# Patient Record
Sex: Female | Born: 1975 | Race: White | Hispanic: No | Marital: Married | Smoking: Current every day smoker
Health system: Southern US, Community
[De-identification: ages and names within clinical notes are randomized; demographics above are authoritative.]

## PROBLEM LIST (undated history)

## (undated) DIAGNOSIS — M1990 Unspecified osteoarthritis, unspecified site: Secondary | ICD-10-CM

## (undated) DIAGNOSIS — G459 Transient cerebral ischemic attack, unspecified: Secondary | ICD-10-CM

## (undated) DIAGNOSIS — E119 Type 2 diabetes mellitus without complications: Secondary | ICD-10-CM

## (undated) DIAGNOSIS — F32A Depression, unspecified: Secondary | ICD-10-CM

## (undated) DIAGNOSIS — O039 Complete or unspecified spontaneous abortion without complication: Secondary | ICD-10-CM

## (undated) DIAGNOSIS — D649 Anemia, unspecified: Secondary | ICD-10-CM

## (undated) DIAGNOSIS — I639 Cerebral infarction, unspecified: Secondary | ICD-10-CM

## (undated) DIAGNOSIS — Q211 Atrial septal defect: Secondary | ICD-10-CM

## (undated) DIAGNOSIS — T7840XA Allergy, unspecified, initial encounter: Secondary | ICD-10-CM

## (undated) DIAGNOSIS — K219 Gastro-esophageal reflux disease without esophagitis: Secondary | ICD-10-CM

## (undated) DIAGNOSIS — G43909 Migraine, unspecified, not intractable, without status migrainosus: Secondary | ICD-10-CM

## (undated) DIAGNOSIS — G4730 Sleep apnea, unspecified: Secondary | ICD-10-CM

## (undated) DIAGNOSIS — F329 Major depressive disorder, single episode, unspecified: Secondary | ICD-10-CM

## (undated) DIAGNOSIS — D869 Sarcoidosis, unspecified: Secondary | ICD-10-CM

## (undated) DIAGNOSIS — F419 Anxiety disorder, unspecified: Secondary | ICD-10-CM

## (undated) DIAGNOSIS — Q2112 Patent foramen ovale: Secondary | ICD-10-CM

## (undated) HISTORY — PX: BRONCHOSCOPY: SUR163

## (undated) HISTORY — DX: Transient cerebral ischemic attack, unspecified: G45.9

## (undated) HISTORY — DX: Migraine, unspecified, not intractable, without status migrainosus: G43.909

## (undated) HISTORY — DX: Major depressive disorder, single episode, unspecified: F32.9

## (undated) HISTORY — DX: Anemia, unspecified: D64.9

## (undated) HISTORY — DX: Depression, unspecified: F32.A

## (undated) HISTORY — PX: ABDOMINAL HYSTERECTOMY: SHX81

## (undated) HISTORY — DX: Type 2 diabetes mellitus without complications: E11.9

## (undated) HISTORY — DX: Patent foramen ovale: Q21.12

## (undated) HISTORY — DX: Complete or unspecified spontaneous abortion without complication: O03.9

## (undated) HISTORY — DX: Sleep apnea, unspecified: G47.30

## (undated) HISTORY — PX: NASAL SINUS SURGERY: SHX719

## (undated) HISTORY — DX: Unspecified osteoarthritis, unspecified site: M19.90

## (undated) HISTORY — PX: HAND SURGERY: SHX662

## (undated) HISTORY — PX: CHOLECYSTECTOMY: SHX55

## (undated) HISTORY — DX: Anxiety disorder, unspecified: F41.9

## (undated) HISTORY — DX: Allergy, unspecified, initial encounter: T78.40XA

## (undated) HISTORY — DX: Sarcoidosis, unspecified: D86.9

## (undated) HISTORY — DX: Gastro-esophageal reflux disease without esophagitis: K21.9

## (undated) HISTORY — DX: Atrial septal defect: Q21.1

## (undated) HISTORY — DX: Cerebral infarction, unspecified: I63.9

---

## 1997-12-18 ENCOUNTER — Emergency Department (HOSPITAL_COMMUNITY): Admission: EM | Admit: 1997-12-18 | Discharge: 1997-12-18 | Payer: Self-pay | Admitting: Emergency Medicine

## 1998-07-22 ENCOUNTER — Other Ambulatory Visit: Admission: RE | Admit: 1998-07-22 | Discharge: 1998-07-22 | Payer: Self-pay | Admitting: Gynecology

## 1999-08-08 ENCOUNTER — Other Ambulatory Visit: Admission: RE | Admit: 1999-08-08 | Discharge: 1999-08-08 | Payer: Self-pay | Admitting: Gynecology

## 1999-10-28 ENCOUNTER — Encounter: Admission: RE | Admit: 1999-10-28 | Discharge: 2000-01-26 | Payer: Self-pay | Admitting: *Deleted

## 2000-08-14 ENCOUNTER — Other Ambulatory Visit: Admission: RE | Admit: 2000-08-14 | Discharge: 2000-08-14 | Payer: Self-pay | Admitting: Gynecology

## 2001-08-19 ENCOUNTER — Other Ambulatory Visit: Admission: RE | Admit: 2001-08-19 | Discharge: 2001-08-19 | Payer: Self-pay | Admitting: Gynecology

## 2002-10-02 ENCOUNTER — Other Ambulatory Visit: Admission: RE | Admit: 2002-10-02 | Discharge: 2002-10-02 | Payer: Self-pay | Admitting: Gynecology

## 2002-10-07 ENCOUNTER — Encounter: Admission: RE | Admit: 2002-10-07 | Discharge: 2002-10-07 | Payer: Self-pay | Admitting: Otolaryngology

## 2002-10-07 ENCOUNTER — Encounter: Payer: Self-pay | Admitting: Otolaryngology

## 2002-11-14 ENCOUNTER — Encounter: Payer: Self-pay | Admitting: Surgery

## 2002-11-14 ENCOUNTER — Inpatient Hospital Stay (HOSPITAL_COMMUNITY): Admission: EM | Admit: 2002-11-14 | Discharge: 2002-11-15 | Payer: Self-pay | Admitting: Emergency Medicine

## 2002-11-14 ENCOUNTER — Encounter (INDEPENDENT_AMBULATORY_CARE_PROVIDER_SITE_OTHER): Payer: Self-pay | Admitting: Specialist

## 2002-11-14 ENCOUNTER — Encounter: Payer: Self-pay | Admitting: Emergency Medicine

## 2005-03-28 ENCOUNTER — Encounter: Admission: RE | Admit: 2005-03-28 | Discharge: 2005-03-28 | Payer: Self-pay

## 2005-03-31 ENCOUNTER — Ambulatory Visit: Payer: Self-pay | Admitting: Internal Medicine

## 2005-05-12 ENCOUNTER — Ambulatory Visit: Payer: Self-pay | Admitting: Internal Medicine

## 2005-05-29 ENCOUNTER — Ambulatory Visit: Payer: Self-pay | Admitting: Internal Medicine

## 2005-07-11 ENCOUNTER — Ambulatory Visit: Payer: Self-pay | Admitting: Internal Medicine

## 2005-08-30 ENCOUNTER — Ambulatory Visit: Payer: Self-pay | Admitting: Pulmonary Disease

## 2005-09-08 ENCOUNTER — Encounter (INDEPENDENT_AMBULATORY_CARE_PROVIDER_SITE_OTHER): Payer: Self-pay | Admitting: Specialist

## 2005-09-08 ENCOUNTER — Ambulatory Visit: Admission: RE | Admit: 2005-09-08 | Discharge: 2005-09-08 | Payer: Self-pay | Admitting: Internal Medicine

## 2005-09-08 ENCOUNTER — Ambulatory Visit: Payer: Self-pay | Admitting: Internal Medicine

## 2005-09-26 ENCOUNTER — Ambulatory Visit: Payer: Self-pay | Admitting: Internal Medicine

## 2005-11-07 ENCOUNTER — Ambulatory Visit: Payer: Self-pay | Admitting: Internal Medicine

## 2006-01-03 ENCOUNTER — Ambulatory Visit: Payer: Self-pay | Admitting: Internal Medicine

## 2006-01-12 ENCOUNTER — Ambulatory Visit: Payer: Self-pay | Admitting: Internal Medicine

## 2007-04-05 DIAGNOSIS — R059 Cough, unspecified: Secondary | ICD-10-CM | POA: Insufficient documentation

## 2007-04-05 DIAGNOSIS — D869 Sarcoidosis, unspecified: Secondary | ICD-10-CM

## 2007-04-05 DIAGNOSIS — J40 Bronchitis, not specified as acute or chronic: Secondary | ICD-10-CM | POA: Insufficient documentation

## 2007-04-05 DIAGNOSIS — L52 Erythema nodosum: Secondary | ICD-10-CM | POA: Insufficient documentation

## 2007-04-05 DIAGNOSIS — R05 Cough: Secondary | ICD-10-CM

## 2007-04-05 DIAGNOSIS — M129 Arthropathy, unspecified: Secondary | ICD-10-CM | POA: Insufficient documentation

## 2007-04-05 DIAGNOSIS — R0602 Shortness of breath: Secondary | ICD-10-CM | POA: Insufficient documentation

## 2007-04-05 DIAGNOSIS — D860 Sarcoidosis of lung: Secondary | ICD-10-CM | POA: Insufficient documentation

## 2007-04-05 DIAGNOSIS — J00 Acute nasopharyngitis [common cold]: Secondary | ICD-10-CM | POA: Insufficient documentation

## 2009-09-10 ENCOUNTER — Ambulatory Visit: Payer: Self-pay | Admitting: Diagnostic Radiology

## 2009-09-10 ENCOUNTER — Encounter: Payer: Self-pay | Admitting: Emergency Medicine

## 2009-09-11 ENCOUNTER — Observation Stay (HOSPITAL_COMMUNITY): Admission: EM | Admit: 2009-09-11 | Discharge: 2009-09-11 | Payer: Self-pay | Admitting: Emergency Medicine

## 2009-09-11 ENCOUNTER — Encounter (INDEPENDENT_AMBULATORY_CARE_PROVIDER_SITE_OTHER): Payer: Self-pay | Admitting: Pediatrics

## 2009-09-11 ENCOUNTER — Ambulatory Visit: Payer: Self-pay | Admitting: Vascular Surgery

## 2009-09-30 ENCOUNTER — Ambulatory Visit (HOSPITAL_COMMUNITY): Admission: RE | Admit: 2009-09-30 | Discharge: 2009-09-30 | Payer: Self-pay | Admitting: Cardiology

## 2009-09-30 ENCOUNTER — Encounter (INDEPENDENT_AMBULATORY_CARE_PROVIDER_SITE_OTHER): Payer: Self-pay | Admitting: Cardiology

## 2010-07-04 LAB — PROTEIN S, TOTAL: Protein S Ag, Total: 80 % (ref 70–140)

## 2010-07-04 LAB — CBC
MCV: 87.8 fL (ref 78.0–100.0)
Platelets: 234 10*3/uL (ref 150–400)
Platelets: 290 10*3/uL (ref 150–400)
RBC: 4.71 MIL/uL (ref 3.87–5.11)
RDW: 12.8 % (ref 11.5–15.5)
WBC: 6.2 10*3/uL (ref 4.0–10.5)
WBC: 7.5 10*3/uL (ref 4.0–10.5)

## 2010-07-04 LAB — URINALYSIS, ROUTINE W REFLEX MICROSCOPIC
Glucose, UA: NEGATIVE mg/dL
Ketones, ur: NEGATIVE mg/dL
Nitrite: NEGATIVE
Nitrite: NEGATIVE
Protein, ur: NEGATIVE mg/dL
Protein, ur: NEGATIVE mg/dL
Urobilinogen, UA: 0.2 mg/dL (ref 0.0–1.0)
Urobilinogen, UA: 0.2 mg/dL (ref 0.0–1.0)
pH: 6 (ref 5.0–8.0)

## 2010-07-04 LAB — PREGNANCY, URINE: Preg Test, Ur: NEGATIVE

## 2010-07-04 LAB — COMPREHENSIVE METABOLIC PANEL
ALT: 41 U/L — ABNORMAL HIGH (ref 0–35)
AST: 23 U/L (ref 0–37)
AST: 26 U/L (ref 0–37)
Albumin: 4.4 g/dL (ref 3.5–5.2)
BUN: 8 mg/dL (ref 6–23)
CO2: 28 mEq/L (ref 19–32)
CO2: 29 mEq/L (ref 19–32)
Calcium: 9.4 mg/dL (ref 8.4–10.5)
Chloride: 105 mEq/L (ref 96–112)
Chloride: 106 mEq/L (ref 96–112)
Creatinine, Ser: 0.57 mg/dL (ref 0.4–1.2)
Creatinine, Ser: 0.6 mg/dL (ref 0.4–1.2)
GFR calc Af Amer: >60 mL/min (ref >60)
GFR calc Af Amer: >60 mL/min (ref >60)
GFR calc non Af Amer: >60 mL/min (ref >60)
GFR calc non Af Amer: >60 mL/min (ref >60)
Sodium: 146 mEq/L — ABNORMAL HIGH (ref 135–145)
Total Bilirubin: 0.2 mg/dL — ABNORMAL LOW (ref 0.3–1.2)
Total Bilirubin: 0.4 mg/dL (ref 0.3–1.2)

## 2010-07-04 LAB — DIFFERENTIAL
Basophils Absolute: 0 10*3/uL (ref 0.0–0.1)
Basophils Relative: 1 % (ref 0–1)
Eosinophils Relative: 0 % (ref 0–5)
Lymphocytes Relative: 34 % (ref 12–46)
Lymphs Abs: 2.1 10*3/uL (ref 0.7–4.0)
Monocytes Relative: 5 % (ref 3–12)
Neutro Abs: 3.7 10*3/uL (ref 1.7–7.7)

## 2010-07-04 LAB — BETA-2-GLYCOPROTEIN I ABS, IGG/M/A
Beta-2-Glycoprotein I IgA: 6 A Units (ref <20)
Beta-2-Glycoprotein I IgM: 0 M Units (ref <20)

## 2010-07-04 LAB — CK TOTAL AND CKMB (NOT AT ARMC)
CK, MB: 1.7 ng/mL (ref 0.3–4.0)
Relative Index: INVALID (ref 0.0–2.5)

## 2010-07-04 LAB — PROTEIN S ACTIVITY: Protein S Activity: 86 % (ref 69–129)

## 2010-07-04 LAB — HOMOCYSTEINE: Homocysteine: 4.3 umol/L (ref 4.0–15.4)

## 2010-07-04 LAB — LIPID PANEL
Cholesterol: 189 mg/dL (ref 0–200)
HDL: 44 mg/dL (ref >39)
Total CHOL/HDL Ratio: 4.3 RATIO
Triglycerides: 46 mg/dL (ref <150)

## 2010-07-04 LAB — PROTIME-INR: INR: 1 (ref 0.00–1.49)

## 2010-07-04 LAB — GLUCOSE, CAPILLARY: Glucose-Capillary: 111 mg/dL — ABNORMAL HIGH (ref 70–99)

## 2010-07-04 LAB — PROTEIN C, TOTAL: Protein C, Total: 126 % (ref 70–140)

## 2010-07-04 LAB — LUPUS ANTICOAGULANT PANEL: PTT Lupus Anticoagulant: 37.2 secs (ref 32.0–43.4)

## 2010-07-04 LAB — APTT: aPTT: 29 seconds (ref 24–37)

## 2010-07-04 LAB — PROTHROMBIN GENE MUTATION

## 2010-07-04 LAB — FACTOR 5 LEIDEN

## 2010-09-02 NOTE — H&P (Signed)
NAME:  Shelly [name], Shelly [name]                        ACCOUNT NO.:  [id number]   MEDICAL RECORD NO.:  [id number]                   PATIENT TYPE:  INP   LOCATION:  0478                                 FACILITY:  [facility]   PHYSICIAN:  Abigail Miyamoto, M.D.              DATE OF BIRTH:  August 06, 1975   DATE OF ADMISSION:  11/14/2002  DATE OF DISCHARGE:                                HISTORY & PHYSICAL   CHIEF COMPLAINT:  Abdominal pain, nausea, and vomiting.   HISTORY:  Ms. Shelly [name] is a 35 year old female with a five week history of  intermittent right upper quadrant abdominal pain and nausea.  She reports  over the past few days the pain has become much worse and she has also  developed emesis today.  She denies any jaundice with this.  She reports the  pain is potentially related to fatty meals.  She has been moving her bowels  well and passing urine normally.  She denies any fevers or chills, chest  pain, or shortness of breath.  She reports the pain is 8/10 and sharp.  Currently, she has had sudden resolution of her pain.   PAST MEDICAL HISTORY:  Sinusitis.   PAST SURGICAL HISTORY:  Sinus surgery.  She has also had orthopedic surgery  on her left hand after trauma.   MEDICATIONS:  1. Birth control pills.  2. Zyrtec.   SOCIAL HISTORY:  She does smoke and use alcohol on occasion.  She does not  use drugs.   REVIEW OF SYSTEMS:  Otherwise unremarkable from a cardiopulmonary  standpoint.   PHYSICAL EXAMINATION:  VITAL SIGNS:  Temperature 99.3, blood pressure  107/66, heart rate 64, respiratory rate 20.  GENERAL:  She is well appearing.  HEENT:  Eyes:  There is minimal scleral icterus.  Pupils are reactive.  Ear,  nose, mouth, and throat:  Her external ears and nose are normal.  Oropharynx  is clear.  Hearing is normal.  NECK:  There is no adenopathy.  There is no thyromegaly.  LUNGS:  Clear to auscultation bilaterally with normal effort.  CARDIOVASCULAR:  Regular rate and rhythm with a  systolic ejection murmur.  There is no peripheral edema.  ABDOMEN:  Soft.  There is minimal tenderness in the epigastrium.  There is  minimal guarding.  There is no organomegaly.  There are no hernias.  EXTREMITIES:  Warm and well perfused.   LABORATORY DATA:  The patient has a bilirubin elevated at 30.1 and alkaline  phosphatase normal at 89, AST elevated at 181, and ALT elevated 232.  Electrolytes are sodium of 139, potassium 3.6, BUN and creatinine 6 and 0.7.  CBC shows the patient to have a white blood count of 7.3 with a hemoglobin  12.4.  Report from abdominal ultrasound shows the patient to have a positive  Murphy's sign, gallstones with sludge, and findings consistent with acute  cholecystitis.   ASSESSMENT/PLAN:  This is  a 35 year old female with acute cholecystitis and  possible choledocholithiasis.  At this point we will repeat her bilirubin.  If the bilirubin remains elevated we may need to get a gastroenterology  consult and an ERCP prior to going to the operating room.  If not, we will  proceed to the operating room for a laparoscopic cholecystectomy and  intraoperative cholangiogram.  I have discussed the surgery with her in  detail including the risks of bleeding, infection, common bile duct injury,  need for open surgery, etc. and at this point she wishes to proceed with  current plan.  We will start her on intravenous antibiotics and proceed with  intravenous rehydration and bowel rest.                                               Abigail Miyamoto, M.D.    DB/MEDQ  D:  11/14/2002  T:  11/14/2002  Job:  [id number]

## 2010-09-02 NOTE — Assessment & Plan Note (Signed)
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  She had been tapered down to  prednisone 5 alternating with 10 mg per day before she developed acute  worsening of cough preceded by a nasal symptoms of rhinitis with postnasal  drip for which I had initially recommended doxycycline but now was started  on Avalox five days ago and continues to have a hacking cough with  transitionally yellow sputum and then back toward white again now.  She  denies any pleuritic pain or fever, sore throat, or ongoing nasal  complaints.  Interestingly, she denies any acute arthritic symptoms or  recurrent erythema nodosum or rash suggestive of erythema nodosum.   PHYSICAL EXAMINATION:  She is a pleasant, pretty white female who does not  appear acutely ill.  She has stable vital signs except for a pulse rate of 130.  HEENT:  Unremarkable.  Pharynx was clear.  NECK:  Supple without cervical adenopathy or tenderness.  Trachea is  midline.  No megaly.  Lung fields reveal a few pops and squeaks bilaterally but with minimal  expiratory rhonchi.  There was a regular rhythm with a regular apical rhythm  at about 120.  ABDOMEN:  Soft and benign.  EXTREMITIES:  Warm without calf tenderness, cyanosis, clubbing, edema, or  erythema nodosum.   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  Although  I do not really think she is having a sarcoid flare, I did recommend  increasing prednisone to 20 mg per day until better than rapid taper back  down  to 5 mg per day, her new floor.  I also will obtain a sedimentation  rate and ACE level today to see if there is any evidence of increased  sarcoid activity (previously she had dropped down to 56 on treatment on last  study date September 26, 2005, while on prednisone 20 mg daily).            ______________________________  Charlaine Dalton Sherene Sires, MD, [facility]    MBW/MedQ  DD:  01/12/2006  DT:  01/15/2006  Job #:  [id number]   cc:   Harrel Lemon. Merla Riches, M.D.

## 2010-09-02 NOTE — Assessment & Plan Note (Signed)
[facility]                               PULMONARY OFFICE NOTE   NAME:[name], Shelly RYBOLT                     MRN:          [id number]  DATE:01/03/2006                            DOB:          1975-07-20    HISTORY:  A 35 year old white female with classic sarcoid associated with  arthritis and erythema nodosum, onset in October 2006 and on prednisone  daily since November 2006.  Shelly [name] has now been able to taper prednisone down  to 5 mg alternating with 10 mg per day with no flare-up of either cough,  dyspnea or arthritis symptoms.   PHYSICAL EXAMINATION:  GENERAL:  Shelly [name] is a pleasant, ambulatory white female  in no acute distress.  VITAL SIGNS:  Stable vital signs.  HEENT:  Unremarkable.  NECK:  Clear.  LUNGS:  Lung fields are clear bilaterally to auscultation and percussion.  ABDOMEN:  Soft, benign.  EXTREMITIES:  Warm without calf tenderness, cyanosis, clubbing or edema.   Heme saturation 98% on room air.   Chest x-ray shows almost complete resolution of adenopathy and no  infiltrates.   IMPRESSION:  Dramatic reversal of all of her symptoms of sarcoid while on  prednisone and no flare-up as Shelly [name] is tapered down to 5 mg alternating with  10 mg per day.  Her chest x-ray also has shown gradual improvement in both  adenopathy and interstitial changes.   I therefore recommend that the prednisone be tapered to 5 mg every day for 4  weeks and then every other day for 2 weeks and then return here looking for  any evidence of increased skin or joint involvement or respiratory  complaints or nausea, for that matter (which might indicate adrenal  insufficiency).   When Shelly [name] rechecks at 6 weeks we will recheck an ACE level (it had decreased  from 129 before treatment all the way down to 56 while on treatment).                                   Charlaine Dalton. Sherene Sires, MD, [facility]   MBW/MedQ  DD:  01/03/2006  DT:  01/05/2006  Job #:  [id number]   cc:   Harrel Lemon.  Merla Riches, M.D.

## 2010-09-02 NOTE — Assessment & Plan Note (Signed)
[facility]                               PULMONARY OFFICE NOTE   NAME:Shelly [name], Shelly [name]                     MRN:          [id number]  DATE:11/07/2005                            DOB:          [birth year]    HISTORY:  35 year old white female with classic sarcoid, initially causing  polyarthritis and erythema nodosum in October 2006, and maintained on  prednisone since November 2006 with increasing symptoms of coughing and  dyspnea, for which she underwent transbronchial biopsies on May 25, showing  noncaseating granulomatous inflammation.  On 20 mg per day of prednisone,  all of her symptoms completely resolved, and she returns now, having tapered  prednisone down to 10 mg per day with no flare-up of any symptoms, and is  only short of breath with heavy exertion.  She is actively smoking, however.   She denies any active arthritis complaints, fevers, chills, sweats,  unintended weight loss, cough or unexplained dyspnea.  She does have new  acneiform skin rash over the extensor surfaces of her arms, for which she  has been prescribed doxycycline with no biopsy done to date.   PHYSICAL EXAMINATION:  She is a moderately obese, very pleasant, ambulatory  white female in no acute distress.  She has normal vital signs.  HEENT:  Unremarkable.  Anicteric sclerae.  LUNGS:  Lung fields perfectly clear bilaterally to auscultation and  percussion.  There was no cervical adenopathy or tenderness.  Trachea is  midline.  No thyromegaly.  Regular rhythm without murmur, gallop or rub.  No increase in P2.  ABDOMEN:  Obese but otherwise benign, with no palpable organomegaly, mass or  tenderness.  EXTREMITIES:  Warm without calf tenderness, cyanosis, clubbing or edema.   Chart review indicates ACE levels were 129 off of prednisone, and down to 56  on her last visit on prednisone at 20 mg daily.   IMPRESSION:  Complete remission of sarcoid, with the only issue  being  whether or not the skin changes we are seeing might be either steroid acne  or actually nodular skin involvement with sarcoid.  Since the skin issue is  cosmetic, I recommended tapering prednisone to 10 mg, and alternating with  5 mg per day for the next 6 weeks, and seeing if the skin problem flares.  If so, I would strongly consider a skin biopsy.   I did give her symptoms to be on the lookout for either sarcoid flare or  adrenal insufficiency, and when we see her back in 6 weeks, I recommended a  repeat chest x-ray and ACE level.  In the meantime, should she have either  adrenal insufficiency or obvious sarcoid symptoms that flare as the  prednisone is tapered, I am going to recommend she increase the dose back to  the  previous dose, which successfully controlled her symptoms.  On her next  visit, I am going to recommend working toward a 5 mg every other day taper.  Charlaine Dalton. Sherene Sires, MD, [facility]   MBW/MedQ  DD:  11/07/2005  DT:  11/08/2005  Job #:  [id number]   cc:   Areatha Keas, MD

## 2010-09-02 NOTE — Op Note (Signed)
Shelly [name], Shelly [name]              ACCOUNT NO.:  [id number]   MEDICAL RECORD NO.:  [id number]          PATIENT TYPE:  AMB   LOCATION:  CARD                         FACILITY:  [facility]   PHYSICIAN:  Casimiro Needle B. Sherene Sires, M.D. Romualdo Bolk OF BIRTH:  11-Jul-1975   DATE OF PROCEDURE:  09/08/2005  DATE OF DISCHARGE:                                 OPERATIVE REPORT   REFERRING PHYSICIAN:  Areatha Keas, M.D.   INDICATIONS FOR PROCEDURE:  Please see dictated original consultation note  plus Coralyn Helling, M.D.'s, note regarding history, indications.  The patient  agreed to procedure after discussion of the risks, benefits, and  alternatives.   There has been no change in her history or exam since Dr. Evlyn Courier outpatient  dictated evaluation attached to these records.   PROCEDURE:  Fiberoptic bronchoscopy with transbronchial biopsy of the right  lower lobe.   The procedure was performed in the bronchoscopy suite with continuous  monitoring by surface ECG and oximetry.  The patient maintained adequate  saturations throughout the procedure on nasal prongs.  She received a total  of 5 mg of IV Versed and 100 mg of IV Demerol for adequate sedation and  cough suppression.  She was premedicated also with 1% lidocaine by updraft  nebulizer and 2% lidocaine to the right nares.  Using the standard video  fiberoptic bronchoscope, the right nares was easily cannulated with good  visualization of entire oropharynx and larynx.  The cords moved normally and  there were no apparent upper airway lesions.  The entire tracheobronchial tree was then explored bilaterally to the  subsegmental level with all the airways widely open and no evidence of  mucosal abnormalities.  The only variant was that the usual trifurcation of  the right upper lobe was qadrification (four major branches).   Using a standard wedge position and fluoroscopic guidance, the right lower  lobe anterior basal segment was biopsied x3 with adequate tissue  obtained.  After the third biopsy there was moderate bleeding which was controlled by  placing the patient in the right lateral decubitus position and the airways  were cleared of any blood with no active bleeding at the time.  The  procedure was terminated.  Follow-up chest x-ray was done.   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   RECOMMENDATIONS:  Await transbronchial biopsy.           ______________________________  Charlaine Dalton Sherene Sires, M.D. [facility]    MBW/MEDQ  D:  09/08/2005  T:  09/08/2005  Job:  [id number]   cc:   Areatha Keas, M.D.  Fax: [phone]

## 2010-09-02 NOTE — Op Note (Signed)
NAME:  Shelly [name], Shelly [name]                        ACCOUNT NO.:  [id number]   MEDICAL RECORD NO.:  [id number]                   PATIENT TYPE:  INP   LOCATION:  0478                                 FACILITY:  [facility]   PHYSICIAN:  Abigail Miyamoto, M.D.              DATE OF BIRTH:  [birth year]   DATE OF PROCEDURE:  11/14/2002  DATE OF DISCHARGE:                                 OPERATIVE REPORT   PREOPERATIVE DIAGNOSIS:  Acute cholecystitis.   POSTOPERATIVE DIAGNOSIS:  Acute cholecystitis.   PROCEDURE:  Laparoscopic cholecystectomy with intraoperative cholangiogram.   SURGEON:  Douglas A. Magnus Ivan, M.D.   ASSISTANT:  Ollen Gross. Carolynne Edouard, M.D.   ANESTHESIA:  General endotracheal.   ESTIMATED BLOOD LOSS:  Minimal.   INDICATIONS:  Shelly [name] is a 35 year old female, who presented with  abdominal pain, nausea, and vomiting.  She was found on ultrasound to have  findings consistent with acute cholecystitis.  She was also found to have an  elevated bilirubin of 3.1 but a normal alkaline phosphatase.  Secondary to  her resolving symptoms, the decision was made to proceed to the operating  room for a cholecystectomy and cholangiogram.   FINDINGS:  The patient was found to have a normal cholangiogram without  evidence of common bile duct stone.   PROCEDURE IN DETAIL:  The patient was brought to the operating room and  identified as Shelly [name].  She was placed supine on the operating table,  and general anesthesia was induced.  Her abdomen was then prepped and draped  in the usual sterile fashion.  Using a #15 blade, a small transverse  incision was made below the umbilicus.  The incision was carried down to the  fascia which was then opened with a scalpel.  A hemostat was then used to  pass through the peritoneal cavity.  Next, a 0 Vicryl pursestring suture was  placed around the fascial opening.  The Hasson port was placed through the  opening, and insufflation of the abdomen was begun.   A 12 mm port was then  placed in the patient's epigastrium, and two 5 mm ports were placed in the  patient's right flank under direct vision.  The gallbladder was then  identified and retracted above the liver bed.  The gallbladder was found to  be acutely inflamed.  The cystic duct and cystic artery were dissected out.  The artery was actually slightly anterior, so it was clipped twice  proximally, once distally, and transected with scissors.  The cystic duct  was then identified and clipped once distally and partly transected with  scissors.  An angiocatheter was then inserted under direct vision into the  right upper quadrant.  The cholangiocatheter was then passed through the  Angiocath and then placed into the opening in the cystic duct.  A  cholangiogram was then performed under direct fluoroscopy with contrast.  Contrast was seen to flow into the  entire biliary tree and duodenum without  evidence of obstructing stone or lesion.  At this point, the  cholangiocatheter was removed.  The cystic duct was then clipped three times  proximally and transected with the scissors.  The gallbladder was then  slowly dissected free from the liver bed with the electrocautery.  The  gallbladder wall was found to be edematous and acutely inflamed.  Once the  gallbladder was freed from the liver bed, hemostasis was achieved with the  cautery.  The gallbladder was then removed through the incision at the  umbilicus.  The 0 Vicryl at the umbilicus was tied in place, closing the  fascial defect.  The liver bed was again examined and hemostasis felt to be  achieved.  The abdomen was then copiously irrigated with normal saline.  All  ports were then removed under direct vision, and the abdomen was deflated.  All incisions were anesthetized with 0.25% Marcaine and then closed with 4-0  Monocryl subcuticular sutures.  Steri-Strips, gauze, and tape were then  applied.  The patient tolerated the procedure well.   All sponge, needle, and  instrument counts were correct at the end of the procedure.  The patient was  then extubated in the operating room and taken in a stable condition to the  recovery room.                                               Abigail Miyamoto, M.D.    DB/MEDQ  D:  11/14/2002  T:  11/14/2002  Job:  [id number]

## 2011-02-01 ENCOUNTER — Emergency Department (HOSPITAL_COMMUNITY)
Admission: EM | Admit: 2011-02-01 | Discharge: 2011-02-01 | Disposition: A | Discharge status: Home / Self Care | Payer: BC Managed Care – PPO | Attending: Emergency Medicine | Admitting: Emergency Medicine

## 2011-02-01 ENCOUNTER — Emergency Department (HOSPITAL_COMMUNITY): Payer: BC Managed Care – PPO

## 2011-02-01 DIAGNOSIS — Z8673 Personal history of transient ischemic attack (TIA), and cerebral infarction without residual deficits: Secondary | ICD-10-CM | POA: Insufficient documentation

## 2011-02-01 DIAGNOSIS — K219 Gastro-esophageal reflux disease without esophagitis: Secondary | ICD-10-CM | POA: Insufficient documentation

## 2011-02-01 DIAGNOSIS — R209 Unspecified disturbances of skin sensation: Secondary | ICD-10-CM | POA: Insufficient documentation

## 2011-02-01 DIAGNOSIS — R059 Cough, unspecified: Secondary | ICD-10-CM | POA: Insufficient documentation

## 2011-02-01 DIAGNOSIS — J40 Bronchitis, not specified as acute or chronic: Secondary | ICD-10-CM | POA: Insufficient documentation

## 2011-02-01 DIAGNOSIS — D869 Sarcoidosis, unspecified: Secondary | ICD-10-CM | POA: Insufficient documentation

## 2011-02-01 DIAGNOSIS — R05 Cough: Secondary | ICD-10-CM | POA: Insufficient documentation

## 2011-02-01 LAB — DIFFERENTIAL
Basophils Absolute: 0 10*3/uL (ref 0.0–0.1)
Basophils Relative: 0 % (ref 0–1)
Eosinophils Absolute: 0 10*3/uL (ref 0.0–0.7)
Monocytes Absolute: 0.4 10*3/uL (ref 0.1–1.0)
Monocytes Relative: 6 % (ref 3–12)
Neutro Abs: 4.5 10*3/uL (ref 1.7–7.7)

## 2011-02-01 LAB — POCT I-STAT, CHEM 8
Creatinine, Ser: 0.6 mg/dL (ref 0.50–1.10)
HCT: 42 % (ref 36.0–46.0)
Hemoglobin: 14.3 g/dL (ref 12.0–15.0)
Potassium: 3.9 mEq/L (ref 3.5–5.1)
Sodium: 139 mEq/L (ref 135–145)
TCO2: 25 mmol/L (ref 0–100)

## 2011-02-01 LAB — CBC
Hemoglobin: 13.5 g/dL (ref 12.0–15.0)
MCH: 29.1 pg (ref 26.0–34.0)
MCHC: 34 g/dL (ref 30.0–36.0)
Platelets: 266 10*3/uL (ref 150–400)
RDW: 12.6 % (ref 11.5–15.5)

## 2011-04-24 ENCOUNTER — Ambulatory Visit (INDEPENDENT_AMBULATORY_CARE_PROVIDER_SITE_OTHER): Payer: BC Managed Care – PPO

## 2011-04-24 DIAGNOSIS — M545 Low back pain, unspecified: Secondary | ICD-10-CM

## 2011-04-24 DIAGNOSIS — S335XXA Sprain of ligaments of lumbar spine, initial encounter: Secondary | ICD-10-CM

## 2011-05-02 ENCOUNTER — Ambulatory Visit (INDEPENDENT_AMBULATORY_CARE_PROVIDER_SITE_OTHER): Payer: BC Managed Care – PPO

## 2011-05-02 DIAGNOSIS — S339XXA Sprain of unspecified parts of lumbar spine and pelvis, initial encounter: Secondary | ICD-10-CM

## 2011-05-02 DIAGNOSIS — M549 Dorsalgia, unspecified: Secondary | ICD-10-CM

## 2011-08-09 ENCOUNTER — Ambulatory Visit: Payer: BC Managed Care – PPO

## 2011-08-09 ENCOUNTER — Ambulatory Visit (INDEPENDENT_AMBULATORY_CARE_PROVIDER_SITE_OTHER): Payer: BC Managed Care – PPO | Admitting: Family Medicine

## 2011-08-09 VITALS — BP 108/74 | HR 88 | Temp 98.7°F | Resp 16 | Ht 64.0 in | Wt 189.0 lb

## 2011-08-09 DIAGNOSIS — R059 Cough, unspecified: Secondary | ICD-10-CM

## 2011-08-09 DIAGNOSIS — D860 Sarcoidosis of lung: Secondary | ICD-10-CM

## 2011-08-09 DIAGNOSIS — J329 Chronic sinusitis, unspecified: Secondary | ICD-10-CM

## 2011-08-09 DIAGNOSIS — R300 Dysuria: Secondary | ICD-10-CM

## 2011-08-09 DIAGNOSIS — J029 Acute pharyngitis, unspecified: Secondary | ICD-10-CM

## 2011-08-09 DIAGNOSIS — R05 Cough: Secondary | ICD-10-CM

## 2011-08-09 DIAGNOSIS — J019 Acute sinusitis, unspecified: Secondary | ICD-10-CM

## 2011-08-09 DIAGNOSIS — D869 Sarcoidosis, unspecified: Secondary | ICD-10-CM

## 2011-08-09 LAB — POCT URINALYSIS DIPSTICK
Bilirubin, UA: NEGATIVE
Blood, UA: NEGATIVE
Glucose, UA: NEGATIVE
Ketones, UA: NEGATIVE
Nitrite, UA: NEGATIVE
Protein, UA: NEGATIVE
Spec Grav, UA: 1.01
Urobilinogen, UA: 0.2
pH, UA: 6

## 2011-08-09 LAB — POCT UA - MICROSCOPIC ONLY
Bacteria, U Microscopic: NEGATIVE
Casts, Ur, LPF, POC: NEGATIVE
Crystals, Ur, HPF, POC: NEGATIVE
Mucus, UA: NEGATIVE
RBC, urine, microscopic: NEGATIVE
Yeast, UA: NEGATIVE

## 2011-08-09 LAB — POCT RAPID STREP A (OFFICE): Rapid Strep A Screen: NEGATIVE

## 2011-08-09 MED ORDER — BENZONATATE 100 MG PO CAPS: 200.0000 mg | ORAL_CAPSULE | Freq: Two times a day (BID) | ORAL | Status: AC | PRN

## 2011-08-09 MED ORDER — AZITHROMYCIN 250 MG PO TABS: ORAL_TABLET | ORAL | Status: AC

## 2011-08-09 MED ORDER — HYDROCODONE-HOMATROPINE 5-1.5 MG/5ML PO SYRP: 5.0000 mL | ORAL_SOLUTION | Freq: Every evening | ORAL | Status: AC | PRN

## 2011-08-09 NOTE — Progress Notes (Signed)
[facility]:  Office Visit  Chief Complaint:  Chief Complaint  Patient presents with  . Laryngitis  . Cough  . Nasal Congestion    HPI: Shelly [name] is a 36 y.o. female who complains of: 1. 3 day h/o sinus congestions, ear pain on right, cough 2. 1 Day h/o chest congestion, laryngitis and sore throat. She teaches 6 th grade. + chills, + abd pain, RUQ, denies N/V/fevers.   Past Medical History  Diagnosis Date  . Allergy   . Sarcoidosis   . Migraines   . Depression   . Anxiety   . PFO (patent foramen ovale)   . TIA (transient ischemic attack)    Past Surgical History  Procedure Date  . Cholecystectomy   . Nasal sinus surgery   . Hand surgery   . Bronchoscopy   . Cesarean section    History   Social History  . Marital Status: Single    Spouse Name: N/A    Number of Children: N/A  . Years of Education: N/A   Social History Main Topics  . Smoking status: Current Everyday Smoker -- 1.0 packs/day for 22 years    Types: Cigarettes  . Smokeless tobacco: None  . Alcohol Use: None  . Drug Use: None  . Sexually Active: None   Other Topics Concern  . None   Social History Narrative  . None   Family History  Problem Relation Age of Onset  . Osteoporosis Mother   . Diabetes Father   . Heart disease Father    Allergies  Allergen Reactions  . Cephalexin   . Penicillins   . Sulfonamide Derivatives    Prior to Admission medications   Medication Sig Start Date End Date Taking? Authorizing Provider  aspirin 325 MG tablet Take 325 mg by mouth daily.   Yes Historical Provider, MD  norethindrone (MICRONOR,CAMILA,ERRIN) 0.35 MG tablet Take 1 tablet by mouth daily.   Yes Historical Provider, MD  venlafaxine XR (EFFEXOR-XR) 150 MG 24 hr capsule Take 150 mg by mouth daily.   Yes Historical Provider, MD     ROS: The patient denies fevers, chills, night sweats, unintentional weight loss, chest pain, palpitations, wheezing, dyspnea on exertion,  nausea, vomiting, abdominal pain, dysuria, hematuria, melena, numbness, weakness, or tingling.   All other systems have been reviewed and were otherwise negative with the exception of those mentioned in the HPI and as above.    PHYSICAL EXAM: Filed Vitals:   08/09/11 1802  BP: 108/74  Pulse: 88  Temp: 98.7 F (37.1 C)  Resp: 16   Filed Vitals:   08/09/11 1802  Height: 5\' 4"  (1.626 m)  Weight: 189 lb (85.73 kg)   Body mass index is 32.44 kg/(m^2).  General: Alert, no acute distress HEENT:  Normocephalic, atraumatic, oropharynx patent. TM nl. NO exudates.  + sinus pressure. Erythematous tonsils. Large + 2.5  Cardiovascular:  Regular rate and rhythm, no rubs murmurs or gallops.  No Carotid bruits, radial pulse intact. No pedal edema.  Respiratory: Clear to auscultation bilaterally.  No wheezes, rales, or rhonchi.  No cyanosis, no use of accessory musculature GI: No organomegaly, abdomen is soft and non-tender, positive bowel sounds.  No masses. Skin: No rashes. Neurologic: Facial musculature symmetric. Psychiatric: Patient is appropriate throughout our interaction. Lymphatic: No cervical lymphadenopathy Musculoskeletal: Gait intact.   LABS: Results for orders placed in visit on 08/09/11  POCT UA - MICROSCOPIC ONLY      Component Value Range  WBC, Ur, HPF, POC 0-2     RBC, urine, microscopic neg     Bacteria, U Microscopic neg     Mucus, UA neg     Epithelial cells, urine per micros 0-2     Crystals, Ur, HPF, POC neg     Casts, Ur, LPF, POC neg     Yeast, UA neg    POCT URINALYSIS DIPSTICK      Component Value Range   Color, UA yellow     Clarity, UA clear     Glucose, UA neg     Bilirubin, UA neg     Ketones, UA neg     Spec Grav, UA 1.010     Blood, UA neg     pH, UA 6.0     Protein, UA neg     Urobilinogen, UA 0.2     Nitrite, UA neg     Leukocytes, UA Trace    POCT RAPID STREP A (OFFICE)      Component Value Range   Rapid Strep A Screen Negative  Negative       EKG/XRAY:   Primary read interpreted by Dr. Conley Rolls at [facility]. CXR negative for any acute changes when compared to prior xray from 2 years ago.    ASSESSMENT/PLAN: Encounter Diagnoses  Name Primary?  . Pharyngitis Yes  . Cough   . Sinusitis   . Dysuria   . Sarcoidosis ( History of and has not been seen in over 1-2 years)     1. Sinusitis-Z pack 2. Cough-Tessalon Perles and Hycodan 3. UA-will not treat for ? UTI for now since just trace leuks, will get  Urine cx 4. History of sarcoidosis in remission-CXR unchanged from CXR one year ago   Rockne Coons, DO 08/09/2011 6:44 PM

## 2011-08-12 LAB — URINE CULTURE: Colony Count: 25000

## 2011-08-14 ENCOUNTER — Telehealth: Payer: Self-pay | Admitting: Family Medicine

## 2011-11-19 ENCOUNTER — Ambulatory Visit (HOSPITAL_COMMUNITY)
Admission: RE | Admit: 2011-11-19 | Discharge: 2011-11-19 | Disposition: A | Discharge status: Home / Self Care | Payer: BC Managed Care – PPO | Source: Ambulatory Visit | Attending: Family Medicine | Admitting: Family Medicine

## 2011-11-19 ENCOUNTER — Ambulatory Visit (INDEPENDENT_AMBULATORY_CARE_PROVIDER_SITE_OTHER): Payer: BC Managed Care – PPO | Admitting: Family Medicine

## 2011-11-19 VITALS — BP 117/83 | HR 94 | Temp 98.5°F | Resp 18 | Ht 64.25 in | Wt 190.0 lb

## 2011-11-19 DIAGNOSIS — R109 Unspecified abdominal pain: Secondary | ICD-10-CM

## 2011-11-19 DIAGNOSIS — Z9089 Acquired absence of other organs: Secondary | ICD-10-CM | POA: Insufficient documentation

## 2011-11-19 DIAGNOSIS — R110 Nausea: Secondary | ICD-10-CM | POA: Insufficient documentation

## 2011-11-19 DIAGNOSIS — R1031 Right lower quadrant pain: Secondary | ICD-10-CM | POA: Insufficient documentation

## 2011-11-19 DIAGNOSIS — D739 Disease of spleen, unspecified: Secondary | ICD-10-CM | POA: Insufficient documentation

## 2011-11-19 LAB — POCT URINE PREGNANCY: Preg Test, Ur: NEGATIVE

## 2011-11-19 LAB — POCT CBC
Granulocyte percent: 67.6 %G (ref 37–80)
HCT, POC: 47.2 % (ref 37.7–47.9)
MCV: 90.5 fL (ref 80–97)
POC Granulocyte: 6.2 (ref 2–6.9)
POC LYMPH PERCENT: 25.9 %L (ref 10–50)
Platelet Count, POC: 364 10*3/uL (ref 142–424)
RBC: 5.21 M/uL (ref 4.04–5.48)
RDW, POC: 13.7 %

## 2011-11-19 LAB — HCG, QUANTITATIVE, PREGNANCY: hCG, Beta Chain, Quant, S: <1 m[IU]/mL (ref <5)

## 2011-11-19 MED ORDER — IOHEXOL 300 MG/ML  SOLN
100.0000 mL | Freq: Once | INTRAMUSCULAR | Status: AC | PRN
  Administered 2011-11-19: 100 mL via INTRAVENOUS

## 2011-11-19 NOTE — Patient Instructions (Addendum)
Please go to [facility] to have your CT scan.  You should go through the ER entrance but register as an outpatient.   I would like you to have a blood pregnancy test prior to your scan to make sure that you are not pregnant.  Please show this order to the radiology tech and they will help you have your blood drawn.  When you scan is done we will talk about the results on the phone.

## 2011-11-19 NOTE — Progress Notes (Signed)
[facility] [address] [place] [address] [phone]  Date:  11/19/2011   Name:  Shelly [name]   DOB:  1975/05/11   MRN:  [id number]  PCP:  No primary provider on file.    Chief Complaint: Abdominal Pain and Menorrhagia   History of Present Illness:  EDDYE [name] is a 36 y.o. very pleasant female patient who presents with the following:  She got married last Saturday to her partner of several years.  Since last Sunday she felt tired and run down- thought this might have been due to drinking at the wedding.  Then yesterday she felt more tired, felt bloated in her abdomen. She had a lot of stools yesterday- some loose.  Then last night she noted some right lower abdominal pain, and noted light menstrual bleeding.  This morning she noted severe RLQ pain- this has continued all day.  The pain has waxed and wanted today but not gone away.   She has noted some nausea, but no vomiting.  She has slept most of the day and not eaten anything.   She has had one C- section 3 years ago and a cholecystectomy.   She does not note any urinary symptoms such as dysuria or hematuria. She is on OCP- she is not supposed to be bleeding now. LMP was 11/06/11.  She takes a progesterone only OCP due to her PFO and history of TIA.  Her menses are normally pretty regular, but all of her pills are active pills (progesterone only). She did take a pill about 12 hours late within the last couple of weeks.  Otherwise she has no vaginal symptoms.   Patient Active Problem List  Diagnosis  . SARCOIDOSIS  . RHINITIS, ACUTE  . TRACHEOBRONCHITIS  . ERYTHEMA NODOSUM  . ARTHRITIS  . DYSPNEA  . COUGH    Past Medical History  Diagnosis Date  . Allergy   . Sarcoidosis   . Migraines   . Depression   . Anxiety   . PFO (patent foramen ovale)   . TIA (transient ischemic attack)     Past Surgical History  Procedure Date  . Cholecystectomy   . Nasal sinus surgery   . Hand surgery     . Bronchoscopy   . Cesarean section     History  Substance Use Topics  . Smoking status: Current Everyday Smoker -- 1.0 packs/day for 22 years    Types: Cigarettes  . Smokeless tobacco: Not on file  . Alcohol Use: Not on file    Family History  Problem Relation Age of Onset  . Osteoporosis Mother   . Diabetes Father   . Heart disease Father     Allergies  Allergen Reactions  . Cephalexin   . Penicillins   . Sulfonamide Derivatives     Medication list has been reviewed and updated.  Current Outpatient Prescriptions on File Prior to Visit  Medication Sig Dispense Refill  . aspirin 325 MG tablet Take 325 mg by mouth daily.      . norethindrone (MICRONOR,CAMILA,ERRIN) 0.35 MG tablet Take 1 tablet by mouth daily.      Marland Kitchen venlafaxine XR (EFFEXOR-XR) 150 MG 24 hr capsule Take 150 mg by mouth daily.        Review of Systems:  As per HPI- otherwise negative.   Physical Examination: Filed Vitals:   11/19/11 1708  BP: 117/83  Pulse: 94  Temp: 98.5 F (36.9 C)  Resp: 18  Filed Vitals:   11/19/11 1708  Height: 5' 4.25" (1.632 m)  Weight: 190 lb (86.183 kg)   Body mass index is 32.36 kg/(m^2). Ideal Body Weight: Weight in (lb) to have BMI = 25: 146.5   GEN: WDWN, NAD, Non-toxic, A & O x 3, overweight HEENT: Atraumatic, Normocephalic. Neck supple. No masses, No LAD. Ears and Nose: No external deformity. CV: RRR, No M/G/R. No JVD. No thrill. No extra heart sounds. PULM: CTA B, no wheezes, crackles, rhonchi. No retractions. No resp. distress. No accessory muscle use. ABD: S,  ND, +BS.  No HSM. Tender in the RLQ, but no guarding.  Denies rebound tenderness, but objectively she seems to have some rebound tenderness.  GU: menstrual bleeding, cervix appears closed. No apparent discharge, no CMT. Right adnexal tenderness, left adnexa normal.    EXTR: No c/c/e NEURO Normal gait.  PSYCH: Normally interactive. Conversant. Not depressed or anxious appearing.  Calm demeanor.    Results for orders placed in visit on 11/19/11  POCT CBC      Component Value Range   WBC 9.1  4.6 - 10.2 K/uL   Lymph, poc 2.4  0.6 - 3.4   POC LYMPH PERCENT 25.9  10 - 50 %L   MID (cbc) 0.6  0 - 0.9   POC MID % 6.5  0 - 12 %M   POC Granulocyte 6.2  2 - 6.9   Granulocyte percent 67.6  37 - 80 %G   RBC 5.21  4.04 - 5.48 M/uL   Hemoglobin 14.4  12.2 - 16.2 g/dL   HCT, POC 16.1  09.6 - 47.9 %   MCV 90.5  80 - 97 fL   MCH, POC 27.6  27 - 31.2 pg   MCHC 30.5 (*) 31.8 - 35.4 g/dL   RDW, POC 04.5     Platelet Count, POC 364  142 - 424 K/uL   MPV 9.9  0 - 99.8 fL  POCT URINE PREGNANCY      Component Value Range   Preg Test, Ur Negative      Assessment and Plan: 1. Abdominal  pain, other specified site  POCT CBC, POCT urine pregnancy, CT Abdomen Pelvis W Contrast   Hoa is here today with RLQ pain.  Although her wbc count is normal, her history is still concerning for appendicitis.  Also consider an ovarian cyst.  Jaylnn would like to go ahead with  CT tonight to rule- out appendicitis.  She understands that if ovarian pathology is suspected she will need to go to the ED For further evaluation, as U/S is the better test for evaluation of the ovaries.  She and her husband will proceed to [facility] to have her CT.  I will speak with her after the CT is performed.    Zhuri Krass, MD  Spoke with Amaziah last night after CT- no abnormal findings.  Planned to have her see her OBG this morning.

## 2011-11-20 ENCOUNTER — Other Ambulatory Visit: Payer: Self-pay | Admitting: Family Medicine

## 2011-11-20 ENCOUNTER — Telehealth: Payer: Self-pay

## 2011-11-20 ENCOUNTER — Ambulatory Visit
Admission: RE | Admit: 2011-11-20 | Discharge: 2011-11-20 | Disposition: A | Discharge status: Home / Self Care | Payer: BC Managed Care – PPO | Source: Ambulatory Visit | Attending: Family Medicine | Admitting: Family Medicine

## 2011-11-20 ENCOUNTER — Encounter: Payer: Self-pay | Admitting: Family Medicine

## 2011-11-20 DIAGNOSIS — N83209 Unspecified ovarian cyst, unspecified side: Secondary | ICD-10-CM

## 2011-11-20 DIAGNOSIS — R102 Pelvic and perineal pain: Secondary | ICD-10-CM

## 2011-11-20 DIAGNOSIS — D869 Sarcoidosis, unspecified: Secondary | ICD-10-CM

## 2011-11-20 NOTE — Telephone Encounter (Signed)
PT STATES SHE HAD SEEN DR COPLAND AND SENT OVER TO [place] FOR A SCAN AND DR COPLAND TOLD HER TO FOLLOW UP WITH HER GYN THEY HAVE HER RECORDS, BUT WON'T BE ABLE TO SEE HER FOR ANOTHER 6 WKS AND SHE DOESN'T KNOW WHAT TO DO SINCE SHE IS IN A LOT OF PAIN PLEASE CALL [phone]

## 2011-11-20 NOTE — Telephone Encounter (Signed)
Called her to go over ultrasound results- no torsion, but she does have a right ovarian cyst, and some material in her uterus- either blood of a polyp.  Will mail her a copy of her reports so that she can share them with her OB when she sees them in a few weeks for follow- up.    Will refer her to a new rheumatologist to follow her sarcoid.   Suspect that her pain from the ovarian cyst will ease soon- let me know if it does not- Sooner if worse.

## 2011-11-21 ENCOUNTER — Other Ambulatory Visit: Payer: Self-pay | Admitting: Family Medicine

## 2011-11-22 ENCOUNTER — Telehealth: Payer: Self-pay

## 2011-11-22 NOTE — Telephone Encounter (Signed)
Dr Patsy Lager, do you have instr's for pt?

## 2011-11-22 NOTE — Telephone Encounter (Signed)
Called and talked with her.  She is having some blood clots and continues to have pain.  She is not losing large amount of blood.  Advised her to call her OBG tomorrow and request to be seen.  She is certainly welcome to come and see [place] tonight if she would like.  Let us know if any other problems or if we can help

## 2011-11-22 NOTE — Telephone Encounter (Signed)
The patient is experiencing more bleeding and is producing clots vaginally.  The patient would like to know the next step regarding this bleeding in light of her ovarian cyst that was just diagnosed.  Please call the patient at [phone].

## 2011-11-23 ENCOUNTER — Telehealth: Payer: Self-pay | Admitting: Internal Medicine

## 2011-11-23 NOTE — Telephone Encounter (Signed)
Pt would like to come pick up her ultrasound and ct scan report. Pt has an appt today at 2:30pm with her obgyn. Best# [phone]

## 2011-11-23 NOTE — Telephone Encounter (Signed)
Printed out the [place] and CT Scan Report and put in the pick up drawer for patient to pick-up.

## 2011-12-15 ENCOUNTER — Other Ambulatory Visit: Payer: Self-pay | Admitting: Emergency Medicine

## 2012-01-08 ENCOUNTER — Ambulatory Visit: Payer: BC Managed Care – PPO

## 2012-01-08 ENCOUNTER — Ambulatory Visit (INDEPENDENT_AMBULATORY_CARE_PROVIDER_SITE_OTHER): Payer: BC Managed Care – PPO | Admitting: Family Medicine

## 2012-01-08 VITALS — BP 123/82 | HR 127 | Temp 99.4°F | Resp 20 | Ht 64.0 in | Wt 186.0 lb

## 2012-01-08 DIAGNOSIS — J180 Bronchopneumonia, unspecified organism: Secondary | ICD-10-CM

## 2012-01-08 DIAGNOSIS — R059 Cough, unspecified: Secondary | ICD-10-CM

## 2012-01-08 DIAGNOSIS — D8686 Sarcoid arthropathy: Secondary | ICD-10-CM

## 2012-01-08 DIAGNOSIS — M79609 Pain in unspecified limb: Secondary | ICD-10-CM

## 2012-01-08 DIAGNOSIS — R05 Cough: Secondary | ICD-10-CM

## 2012-01-08 DIAGNOSIS — M79604 Pain in right leg: Secondary | ICD-10-CM

## 2012-01-08 DIAGNOSIS — R0989 Other specified symptoms and signs involving the circulatory and respiratory systems: Secondary | ICD-10-CM

## 2012-01-08 LAB — POCT CBC
Granulocyte percent: 81.3 %G — AB (ref 37–80)
MCH, POC: 27.8 pg (ref 27–31.2)
MCV: 89.4 fL (ref 80–97)
MID (cbc): 0.6 (ref 0–0.9)
MPV: 9.5 fL (ref 0–99.8)
POC LYMPH PERCENT: 14.9 %L (ref 10–50)
POC MID %: 3.8 %M (ref 0–12)
Platelet Count, POC: 363 10*3/uL (ref 142–424)
RDW, POC: 13.1 %
WBC: 16.1 10*3/uL — AB (ref 4.6–10.2)

## 2012-01-08 MED ORDER — HYDROCODONE-HOMATROPINE 5-1.5 MG/5ML PO SYRP: 5.0000 mL | ORAL_SOLUTION | Freq: Three times a day (TID) | ORAL | Status: DC | PRN

## 2012-01-08 MED ORDER — LEVOFLOXACIN 500 MG PO TABS: 500.0000 mg | ORAL_TABLET | Freq: Every day | ORAL | Status: DC

## 2012-01-08 NOTE — Patient Instructions (Addendum)
Fluids Rest Return Thurs if not much better, sooner if worse.

## 2012-01-08 NOTE — Progress Notes (Signed)
Subjective: 36 year old lady with history of being sick for the last 10 days or so. She started with a cold and cough this seemed to do better if then she started coughing again. She took a couple of over-the-counter medications and it became very loose with coughing up stuff and blowing stuff out of her nose. She's been running a fever. 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 She has a history of prior pneumonia. She does smoke.  Objective: Looks like she does not feel well. TMs are normal. Throat not erythematous. Neck supple without significant nodes. Chest has rhonchi primarily at the left lower lobe posteriorly. Heart is regular without murmurs. Abdomen soft but mildly tender especially in the right lower abdomen. She has pain into her right leg and thigh.  Assessment: Rhonchi and cough, suspicious for left lower lobe pneumonia Abdominal and leg pain, probably from a heart coughing. He started today. It  Plan: She is supposed to be referred to a rheumatologist last time and that could not take place will try again on that.  CBC and chest x-ray and proceed accordingly  [facility] reading (PRIMARY) by  Dr. Alwyn Ren Atalectasis/early congestion on right.  Left looks clear.Marland Kitchen  Results for orders placed in visit on 01/08/12  POCT CBC      Component Value Range   WBC 16.1 (*) 4.6 - 10.2 K/uL   Lymph, poc 2.4  0.6 - 3.4   POC LYMPH PERCENT 14.9  10 - 50 %L   MID (cbc) 0.6  0 - 0.9   POC MID % 3.8  0 - 12 %M   POC Granulocyte 13.1 (*) 2 - 6.9   Granulocyte percent 81.3 (*) 37 - 80 %G   RBC 5.08  4.04 - 5.48 M/uL   Hemoglobin 14.1  12.2 - 16.2 g/dL   HCT, POC 78.2  95.6 - 47.9 %   MCV 89.4  80 - 97 fL   MCH, POC 27.8  27 - 31.2 pg   MCHC 31.1 (*) 31.8 - 35.4 g/dL   RDW, POC 21.3     Platelet Count, POC 363  142 - 424 K/uL   MPV 9.5  0 - 99.8 fL

## 2012-01-11 ENCOUNTER — Ambulatory Visit (INDEPENDENT_AMBULATORY_CARE_PROVIDER_SITE_OTHER): Payer: BC Managed Care – PPO | Admitting: Family Medicine

## 2012-01-11 VITALS — BP 112/70 | HR 110 | Temp 98.5°F | Resp 18 | Ht 64.5 in | Wt 181.0 lb

## 2012-01-11 DIAGNOSIS — J180 Bronchopneumonia, unspecified organism: Secondary | ICD-10-CM

## 2012-01-11 LAB — POCT CBC
Granulocyte percent: 70.6 %G (ref 37–80)
HCT, POC: 47.6 % (ref 37.7–47.9)
Hemoglobin: 15 g/dL (ref 12.2–16.2)
POC Granulocyte: 8 — AB (ref 2–6.9)

## 2012-01-11 NOTE — Progress Notes (Signed)
Subjective: Maybe a little better. Throat hurts a lot. Still gets clammy and sweaty. Has coughed up some of the mucus. Feels pretty blah.  Objective: TMs are normal. Throat has a little exudate in the tonsillar crypts grossly on the left. The right is 1 hurts more. Neck supple without significant nodes. Chest sounds clear to auscultation today. I do not hear the rhonchi are 3 the other day. Heart regular.  Results for orders placed in visit on 01/11/12  POCT CBC      Component Value Range   WBC 11.3 (*) 4.6 - 10.2 K/uL   Lymph, poc 2.8  0.6 - 3.4   POC LYMPH PERCENT 24.4  10 - 50 %L   MID (cbc) 0.6  0 - 0.9   POC MID % 5.0  0 - 12 %M   POC Granulocyte 8.0 (*) 2 - 6.9   Granulocyte percent 70.6  37 - 80 %G   RBC 5.26  4.04 - 5.48 M/uL   Hemoglobin 15.0  12.2 - 16.2 g/dL   HCT, POC 16.1  09.6 - 47.9 %   MCV 90.5  80 - 97 fL   MCH, POC 28.5  27 - 31.2 pg   MCHC 31.5 (*) 31.8 - 35.4 g/dL   RDW, POC 04.5     Platelet Count, POC 414  142 - 424 K/uL   MPV 10.3  0 - 99.8 fL   Assessment: Bronchopneumonia slowly improving Pharyngitis  Plan: Her urine was dark. I told her to drink more fluids still. Plan to be able to return to work on Monday unless worse. It may take a long time her energy to come back fully.

## 2012-01-11 NOTE — Patient Instructions (Signed)
Continue fluids and rest. Return if worse at any time. Plan to return to work on Monday.

## 2012-01-21 ENCOUNTER — Ambulatory Visit (INDEPENDENT_AMBULATORY_CARE_PROVIDER_SITE_OTHER): Payer: BC Managed Care – PPO | Admitting: Emergency Medicine

## 2012-01-21 VITALS — BP 122/76 | HR 97 | Temp 98.6°F | Resp 16 | Ht 65.0 in | Wt 179.0 lb

## 2012-01-21 DIAGNOSIS — J029 Acute pharyngitis, unspecified: Secondary | ICD-10-CM

## 2012-01-21 LAB — POCT CBC
Granulocyte percent: 76 %G (ref 37–80)
HCT, POC: 43 % (ref 37.7–47.9)
Hemoglobin: 13.5 g/dL (ref 12.2–16.2)
Lymph, poc: 2 (ref 0.6–3.4)
MCH, POC: 28.1 pg (ref 27–31.2)
MCHC: 31.4 g/dL — AB (ref 31.8–35.4)
MCV: 89.4 fL (ref 80–97)
MID (cbc): 0.4 (ref 0–0.9)
MPV: 10 fL (ref 0–99.8)
POC Granulocyte: 7.4 — AB (ref 2–6.9)
POC LYMPH PERCENT: 20.3 %L (ref 10–50)
POC MID %: 3.7 %M (ref 0–12)
Platelet Count, POC: 318 10*3/uL (ref 142–424)
RBC: 4.81 M/uL (ref 4.04–5.48)
RDW, POC: 13.5 %
WBC: 9.7 10*3/uL (ref 4.6–10.2)

## 2012-01-21 LAB — POCT RAPID STREP A (OFFICE): Rapid Strep A Screen: NEGATIVE

## 2012-01-21 MED ORDER — IPRATROPIUM BROMIDE 0.03 % NA SOLN: 2.0000 | Freq: Two times a day (BID) | NASAL | Status: DC

## 2012-01-21 MED ORDER — MAGIC MOUTHWASH W/LIDOCAINE: 10.0000 mL | ORAL | Status: DC | PRN

## 2012-01-21 NOTE — Progress Notes (Signed)
  Subjective:    Patient ID: Shelly [name], female    DOB: 1975/12/21, 36 y.o.   MRN: [id number]  HPI 36 year old female presents with acute onset of sore throat. Symptoms started yesterday and have worsened today.  Does admit to thin rhinorrhea and slight cough.  She was treated for bronchopneumonia on 9/23 with a 7 day course of Levaquin.  States she did start to feel better, but never got to 100%.  Says she does have allergies but is not currently on any medications for them.  Denies nausea, vomiting, abdominal pain, sinus pain, or otalgia.      Review of Systems  Constitutional: Negative for fever and chills.  HENT: Positive for sore throat, rhinorrhea and postnasal drip. Negative for ear pain.   Respiratory: Negative for cough.   Gastrointestinal: Negative for nausea and vomiting.  Neurological: Negative for headaches.  All other systems reviewed and are negative.       Objective:   Physical Exam  Constitutional: She is oriented to person, place, and time. She appears well-developed and well-nourished.  HENT:  Head: Normocephalic and atraumatic.  Right Ear: Hearing, tympanic membrane, external ear and ear canal normal.  Left Ear: Hearing, tympanic membrane, external ear and ear canal normal.  Mouth/Throat: Uvula is midline, oropharynx is clear and moist and mucous membranes are normal. No oropharyngeal exudate (bilateral tonsillar erythema).  Eyes: Conjunctivae normal are normal.  Neck: Normal range of motion. Neck supple.  Cardiovascular: Normal rate, regular rhythm and normal heart sounds.   Pulmonary/Chest: Effort normal and breath sounds normal.  Neurological: She is alert and oriented to person, place, and time.  Psychiatric: She has a normal mood and affect. Her behavior is normal. Judgment and thought content normal.      Results for orders placed in visit on 01/21/12  POCT RAPID STREP A (OFFICE)      Component Value Range   Rapid Strep A Screen Negative  Negative   POCT CBC      Component Value Range   WBC 9.7  4.6 - 10.2 K/uL   Lymph, poc 2.0  0.6 - 3.4   POC LYMPH PERCENT 20.3  10 - 50 %L   MID (cbc) 0.4  0 - 0.9   POC MID % 3.7  0 - 12 %M   POC Granulocyte 7.4 (*) 2 - 6.9   Granulocyte percent 76.0  37 - 80 %G   RBC 4.81  4.04 - 5.48 M/uL   Hemoglobin 13.5  12.2 - 16.2 g/dL   HCT, POC 08.6  57.8 - 47.9 %   MCV 89.4  80 - 97 fL   MCH, POC 28.1  27 - 31.2 pg   MCHC 31.4 (*) 31.8 - 35.4 g/dL   RDW, POC 46.9     Platelet Count, POC 318  142 - 424 K/uL   MPV 10.0  0 - 99.8 fL       Assessment & Plan:   1. Acute pharyngitis  POCT rapid strep A, POCT CBC, ipratropium (ATROVENT) 0.03 % nasal spray, Alum & Mag Hydroxide-Simeth (MAGIC MOUTHWASH W/LIDOCAINE) SOLN   Likely viral etiology vs allergic rhinitis.   Start Atrovent NS bid  Zyrtec daily [facility] prn  Ibuprofen or tylenol as needed for pain Call if no improvement in 3-4 days.

## 2012-01-24 ENCOUNTER — Ambulatory Visit (INDEPENDENT_AMBULATORY_CARE_PROVIDER_SITE_OTHER): Payer: BC Managed Care – PPO | Admitting: Family Medicine

## 2012-01-24 VITALS — BP 130/77 | HR 82 | Temp 98.2°F | Resp 18 | Wt 189.0 lb

## 2012-01-24 DIAGNOSIS — J029 Acute pharyngitis, unspecified: Secondary | ICD-10-CM

## 2012-01-24 DIAGNOSIS — R05 Cough: Secondary | ICD-10-CM

## 2012-01-24 DIAGNOSIS — R059 Cough, unspecified: Secondary | ICD-10-CM

## 2012-01-24 DIAGNOSIS — J039 Acute tonsillitis, unspecified: Secondary | ICD-10-CM

## 2012-01-24 DIAGNOSIS — J019 Acute sinusitis, unspecified: Secondary | ICD-10-CM

## 2012-01-24 MED ORDER — AZITHROMYCIN 250 MG PO TABS: ORAL_TABLET | ORAL | Status: DC

## 2012-01-24 MED ORDER — PREDNISONE 10 MG PO TABS: ORAL_TABLET | ORAL | Status: DC

## 2012-01-24 MED ORDER — HYDROCODONE-HOMATROPINE 5-1.5 MG/5ML PO SYRP: 5.0000 mL | ORAL_SOLUTION | Freq: Three times a day (TID) | ORAL | Status: DC | PRN

## 2012-01-24 NOTE — Patient Instructions (Signed)
Viral Pharyngitis Viral pharyngitis is a viral infection that produces redness, pain, and swelling (inflammation) of the throat. It can spread from person to person (contagious). CAUSES Viral pharyngitis is caused by inhaling a large amount of certain germs called viruses. Many different viruses cause viral pharyngitis. SYMPTOMS Symptoms of viral pharyngitis include:  Sore throat.  Tiredness.  Stuffy nose.  Low-grade fever.  Congestion.  Cough. TREATMENT Treatment includes rest, drinking plenty of fluids, and the use of over-the-counter medication (approved by your caregiver). HOME CARE INSTRUCTIONS   Drink enough fluids to keep your urine clear or pale yellow.  Eat soft, cold foods such as ice cream, frozen ice pops, or gelatin dessert.  Gargle with warm salt water (1 tsp salt per 1 qt of water).  If over age 49, throat lozenges may be used safely.  Only take over-the-counter or prescription medicines for pain, discomfort, or fever as directed by your caregiver. Do not take aspirin. To help prevent spreading viral pharyngitis to others, avoid:  Mouth-to-mouth contact with others.  Sharing utensils for eating and drinking.  Coughing around others. SEEK MEDICAL CARE IF:   You are better in a few days, then become worse.  You have a fever or pain not helped by pain medicines.  There are any other changes that concern you. Document Released: 01/11/2005 Document Revised: 06/26/2011 Document Reviewed: 06/09/2010 [facility] Patient Information 2013 [place], [place]. Laryngitis At the top of your windpipe is your voice box. It is the source of your voice. Inside your voice box are 2 bands of muscles called vocal cords. When you breathe, your vocal cords are relaxed and open so that air can get into the lungs. When you decide to say something, these cords come together and vibrate. The sound from these vibrations goes into your throat and comes out through your mouth as  sound. Laryngitis is an inflammation of the vocal cords that causes hoarseness, cough, loss of voice, sore throat, and dry throat. Laryngitis can be temporary (acute) or long-term (chronic). Most cases of acute laryngitis improve with time.Chronic laryngitis lasts for more than 3 weeks. CAUSES Laryngitis can often be related to excessive smoking, talking, or yelling, as well as inhalation of toxic fumes and allergies. Acute laryngitis is usually caused by a viral infection, vocal strain, measles or mumps, or bacterial infections. Chronic laryngitis is usually caused by vocal cord strain, vocal cord injury, postnasal drip, growths on the vocal cords, or acid reflux. SYMPTOMS   Cough.  Sore throat.  Dry throat. RISK FACTORS  Respiratory infections.  Exposure to irritating substances, such as cigarette smoke, excessive amounts of alcohol, stomach acids, and workplace chemicals.  Voice trauma, such as vocal cord injury from shouting or speaking too loud. DIAGNOSIS  Your cargiver will perform a physical exam. During the physical exam, your caregiver will examine your throat. The most common sign of laryngitis is hoarseness. Laryngoscopy may be necessary to confirm the diagnosis of this condition. This procedure allows your caregiver to look into the larynx. HOME CARE INSTRUCTIONS  Drink enough fluids to keep your urine clear or pale yellow.  Rest until you no longer have symptoms or as directed by your caregiver.  Breathe in moist air.  Take all medicine as directed by your caregiver.  Do not smoke.  Talk as little as possible (this includes whispering).  Write on paper instead of talking until your voice is back to normal.  Follow up with your caregiver if your condition has not improved after 10  days. SEEK MEDICAL CARE IF:   You have trouble breathing.  You cough up blood.  You have persistent fever.  You have increasing pain.  You have difficulty swallowing. MAKE SURE  YOU:  Understand these instructions.  Will watch your condition.  Will get help right away if you are not doing well or get worse. Document Released: 04/03/2005 Document Revised: 06/26/2011 Document Reviewed: 06/09/2010 [facility] Patient Information 2013 [place], [place].

## 2012-01-24 NOTE — Progress Notes (Signed)
Subjective:    Patient ID: Shelly [name], female    DOB: 04-25-1975, 36 y.o.   MRN: [id number] Chief Complaint  Patient presents with  . Follow-up    pharyngitis  . Sore Throat  . Chest Pain  . Cough     HPI  Was out of work from 9/23-9/30 for uri type illness - treated w/ levaquin.  Was seen here 5d prev and told pharyngitis was likely viral vs allergic.  Has been using the meds she was prescribed, atrovent nasal, zyrtec, magic mouthwash but not helping. Can't hardly eat or drink due to sore throat, ears hurting, very lethargic, chest huring more as coughing all night to the point of vomiting. Feels like she has now developed low grade fever/c  Past Medical History  Diagnosis Date  . Allergy   . Sarcoidosis   . Migraines   . Depression   . Anxiety   . PFO (patent foramen ovale)   . TIA (transient ischemic attack)    Current Outpatient Prescriptions on File Prior to Visit  Medication Sig Dispense Refill  . aspirin 325 MG tablet Take 325 mg by mouth daily.      Marland Kitchen ipratropium (ATROVENT) 0.03 % nasal spray Place 2 sprays into the nose 2 (two) times daily.  30 mL  5  . norethindrone (MICRONOR,CAMILA,ERRIN) 0.35 MG tablet Take 1 tablet by mouth daily.       No current facility-administered medications on file prior to visit.   Allergies  Allergen Reactions  . Clindamycin/Lincomycin   . Keflex (Cephalexin)   . Penicillins   . Sulfonamide Derivatives     Review of Systems  Constitutional: Positive for fever, chills, diaphoresis, activity change, appetite change and fatigue. Negative for unexpected weight change.  HENT: Positive for ear pain, congestion, sore throat, rhinorrhea, sneezing and sinus pressure. Negative for nosebleeds, trouble swallowing, neck pain, neck stiffness, voice change, postnasal drip and ear discharge.   Eyes: Negative for discharge and itching.  Respiratory: Positive for cough and chest tightness. Negative for shortness of breath.   Cardiovascular:  Positive for chest pain.  Gastrointestinal: Positive for vomiting. Negative for nausea and abdominal pain.  Skin: Negative for rash.  Neurological: Positive for headaches. Negative for dizziness and syncope.  Hematological: Positive for adenopathy.  Psychiatric/Behavioral: Positive for sleep disturbance.      BP 130/77  Pulse 82  Temp(Src) 98.2 F (36.8 C) (Oral)  Resp 18  Wt 189 lb (85.73 kg)  BMI 31.45 kg/m2  LMP 01/16/2012 Objective:   Physical Exam  Constitutional: She is oriented to person, place, and time. She appears well-developed and well-nourished. She appears lethargic. She appears ill. No distress.  HENT:  Head: Normocephalic and atraumatic. No trismus in the jaw.  Right Ear: External ear and ear canal normal. Tympanic membrane is retracted. A middle ear effusion is present.  Left Ear: External ear and ear canal normal. Tympanic membrane is retracted. A middle ear effusion is present.  Nose: Mucosal edema and rhinorrhea present. Right sinus exhibits maxillary sinus tenderness. Left sinus exhibits maxillary sinus tenderness.  Mouth/Throat: Uvula is midline and mucous membranes are normal. No edematous. Posterior oropharyngeal edema and posterior oropharyngeal erythema present. No oropharyngeal exudate or tonsillar abscesses.  Severe laryngitis  Eyes: Conjunctivae are normal. Right eye exhibits no discharge. Left eye exhibits no discharge. No scleral icterus.  Neck: Normal range of motion. Neck supple.  Cardiovascular: Normal rate, regular rhythm, normal heart sounds and intact distal pulses.   Pulmonary/Chest: Effort normal  and breath sounds normal.  Lymphadenopathy:       Head (right side): Submandibular adenopathy present. No preauricular and no posterior auricular adenopathy present.       Head (left side): Submandibular adenopathy present. No preauricular and no posterior auricular adenopathy present.    She has no cervical adenopathy.       Right: No supraclavicular  adenopathy present.       Left: No supraclavicular adenopathy present.  Neurological: She is oriented to person, place, and time. She appears lethargic.  Skin: Skin is warm and dry. She is not diaphoretic. No erythema.  Psychiatric: She has a normal mood and affect. Her behavior is normal.      Assessment & Plan:  Pharyngitis  Tonsillitis  Sinusitis, acute  Cough - Plan: HYDROcodone-homatropine (HYCODAN) 5-1.5 MG/5ML syrup  Meds ordered this encounter  Medications  . azithromycin (ZITHROMAX) 250 MG tablet    Sig: Take 2 tabs PO x 1 dose, then 1 tab PO QD x 4 days    Dispense:  6 tablet    Refill:  0  . predniSONE (DELTASONE) 10 MG tablet    Sig: 6 tabs po d1, 5 tabs po d2, 4 tabs po d3, 3 tabs po d4, 2 tabs po d5, 1 tab po d6    Dispense:  21 tablet    Refill:  0  . HYDROcodone-homatropine (HYCODAN) 5-1.5 MG/5ML syrup    Sig: Take 5 mLs by mouth every 8 (eight) hours as needed for cough.    Dispense:  120 mL    Refill:  0

## 2012-03-11 ENCOUNTER — Other Ambulatory Visit: Payer: Self-pay | Admitting: Emergency Medicine

## 2012-03-27 ENCOUNTER — Ambulatory Visit (INDEPENDENT_AMBULATORY_CARE_PROVIDER_SITE_OTHER): Payer: BC Managed Care – PPO | Admitting: Family Medicine

## 2012-03-27 VITALS — BP 119/82 | HR 124 | Temp 99.2°F | Resp 20 | Ht 64.5 in | Wt 185.2 lb

## 2012-03-27 DIAGNOSIS — J111 Influenza due to unidentified influenza virus with other respiratory manifestations: Secondary | ICD-10-CM

## 2012-03-27 DIAGNOSIS — R6889 Other general symptoms and signs: Secondary | ICD-10-CM

## 2012-03-27 DIAGNOSIS — J039 Acute tonsillitis, unspecified: Secondary | ICD-10-CM

## 2012-03-27 DIAGNOSIS — J029 Acute pharyngitis, unspecified: Secondary | ICD-10-CM

## 2012-03-27 LAB — POCT INFLUENZA A/B
Influenza A, POC: NEGATIVE
Influenza B, POC: NEGATIVE

## 2012-03-27 LAB — POCT RAPID STREP A (OFFICE): Rapid Strep A Screen: POSITIVE — AB

## 2012-03-27 MED ORDER — PREDNISONE 10 MG PO TABS: ORAL_TABLET | ORAL | Status: DC

## 2012-03-27 MED ORDER — AZITHROMYCIN 500 MG PO TABS: 500.0000 mg | ORAL_TABLET | Freq: Every day | ORAL | Status: DC

## 2012-03-27 NOTE — Progress Notes (Signed)
[facility]:  Office Visit  Chief Complaint:  Chief Complaint  Patient presents with  . Fever  . Sore Throat    spots on tonsills- severe pain in throat  . Nasal Congestion  . Generalized Body Aches    HPI: Shelly [name] is a 36 y.o. female who complains of : Sore throat x 1 day, left tonsil swelling Flu exposure, husband with flu Works at school + fevers, chills  Past Medical History  Diagnosis Date  . Allergy   . Sarcoidosis   . Migraines   . Depression   . Anxiety   . PFO (patent foramen ovale)   . TIA (transient ischemic attack)    Past Surgical History  Procedure Date  . Cholecystectomy   . Nasal sinus surgery   . Hand surgery   . Bronchoscopy   . Cesarean section    History   Social History  . Marital Status: Married    Spouse Name: N/A    Number of Children: N/A  . Years of Education: N/A   Social History Main Topics  . Smoking status: Current Every Day Smoker -- 1.0 packs/day for 22 years    Types: Cigarettes  . Smokeless tobacco: None  . Alcohol Use: None  . Drug Use: None  . Sexually Active: None   Other Topics Concern  . None   Social History Narrative  . None   Family History  Problem Relation Age of Onset  . Osteoporosis Mother   . Diabetes Father   . Heart disease Father    Allergies  Allergen Reactions  . Clindamycin/Lincomycin   . Keflex (Cephalexin)   . Penicillins   . Sulfonamide Derivatives    Prior to Admission medications   Medication Sig Start Date End Date Taking? Authorizing Provider  aspirin 325 MG tablet Take 325 mg by mouth daily.   Yes Historical Provider, MD  cetirizine (ZYRTEC) 10 MG tablet Take 10 mg by mouth daily.   Yes Historical Provider, MD  Multiple Vitamin (MULTIVITAMIN) tablet Take 1 tablet by mouth daily.   Yes Historical Provider, MD  venlafaxine XR (EFFEXOR-XR) 150 MG 24 hr capsule TAKE ONE CAPSULE BY MOUTH EVERY DAY 03/11/12  Yes Marzella Schlein McClung, PA-C  azithromycin  (ZITHROMAX) 250 MG tablet Take 2 tabs PO x 1 dose, then 1 tab PO QD x 4 days 01/24/12   Sherren Mocha, MD  HYDROcodone-homatropine [facility]) 5-1.5 MG/5ML syrup Take 5 mLs by mouth every 8 (eight) hours as needed for cough. 01/24/12   Sherren Mocha, MD  ipratropium (ATROVENT) 0.03 % nasal spray Place 2 sprays into the nose 2 (two) times daily. 01/21/12   Heather Jaquita Rector, PA-C  norethindrone (MICRONOR,CAMILA,ERRIN) 0.35 MG tablet Take 1 tablet by mouth daily.    Historical Provider, MD  predniSONE (DELTASONE) 10 MG tablet 6 tabs po d1, 5 tabs po d2, 4 tabs po d3, 3 tabs po d4, 2 tabs po d5, 1 tab po d6 01/24/12   Sherren Mocha, MD     ROS: The patient denies night sweats, unintentional weight loss, chest pain, palpitations, wheezing, dyspnea on exertion, nausea, vomiting, abdominal pain, dysuria, hematuria, melena, numbness, weakness, or tingling  All other systems have been reviewed and were otherwise negative with the exception of those mentioned in the HPI and as above.    PHYSICAL EXAM: Filed Vitals:   03/27/12 1812  BP: 119/82  Pulse: 124  Temp: 99.2 F (37.3 C)  Resp: 20  Filed Vitals:   03/27/12 1812  Height: 5' 4.5" (1.638 m)  Weight: 185 lb 3.2 oz (84.006 kg)   Body mass index is 31.30 kg/(m^2).  General: Alert, no acute distress HEENT:  Normocephalic, atraumatic, oropharynx patent. + exudates, + tonsillar swelling left side. TM nl Cardiovascular:  Regular rate and rhythm, no rubs murmurs or gallops.  No Carotid bruits, radial pulse intact. No pedal edema.  Respiratory: Clear to auscultation bilaterally.  No wheezes, rales, or rhonchi.  No cyanosis, no use of accessory musculature GI: No organomegaly, abdomen is soft and non-tender, positive bowel sounds.  No masses. Skin: No rashes. Neurologic: Facial musculature symmetric. Psychiatric: Patient is appropriate throughout our interaction. Lymphatic: No cervical lymphadenopathy Musculoskeletal: Gait intact.   LABS: Results for  orders placed in visit on 03/27/12  POCT INFLUENZA A/B      Component Value Range   Influenza A, POC Negative     Influenza B, POC Negative    POCT RAPID STREP A (OFFICE)      Component Value Range   Rapid Strep A Screen Positive (*) Negative     EKG/XRAY:   Primary read interpreted by Dr. Conley Rolls at [facility].   ASSESSMENT/PLAN: Encounter Diagnoses  Name Primary?  . Pharyngitis Yes  . Flu-like symptoms   . Tonsillitis with exudate    Strep pharyngitis and tonsillitis PCN allergy so will  rx azithromycin 500 mg daily x 7 days Rx Prednisone Taper F/u prn    Lennon Richins PHUONG, DO 03/29/2012 11:10 AM

## 2012-06-10 ENCOUNTER — Ambulatory Visit: Payer: BC Managed Care – PPO

## 2012-07-22 ENCOUNTER — Ambulatory Visit (INDEPENDENT_AMBULATORY_CARE_PROVIDER_SITE_OTHER): Payer: BC Managed Care – PPO | Admitting: Family Medicine

## 2012-07-22 VITALS — BP 128/82 | HR 84 | Temp 98.9°F | Resp 16 | Ht 64.5 in | Wt 198.0 lb

## 2012-07-22 DIAGNOSIS — J029 Acute pharyngitis, unspecified: Secondary | ICD-10-CM

## 2012-07-22 DIAGNOSIS — R1032 Left lower quadrant pain: Secondary | ICD-10-CM

## 2012-07-22 LAB — POCT URINALYSIS DIPSTICK
Bilirubin, UA: NEGATIVE
Leukocytes, UA: NEGATIVE
Nitrite, UA: NEGATIVE
Protein, UA: NEGATIVE
Urobilinogen, UA: 0.2
pH, UA: 5.5

## 2012-07-22 LAB — POCT CBC
Granulocyte percent: 70.1 %G (ref 37–80)
MCV: 90.3 fL (ref 80–97)
MID (cbc): 0.5 (ref 0–0.9)
POC LYMPH PERCENT: 24 %L (ref 10–50)
Platelet Count, POC: 266 10*3/uL (ref 142–424)
RDW, POC: 13.4 %

## 2012-07-22 LAB — POCT UA - MICROSCOPIC ONLY
Crystals, Ur, HPF, POC: NEGATIVE
Yeast, UA: NEGATIVE

## 2012-07-22 MED ORDER — IPRATROPIUM BROMIDE 0.03 % NA SOLN: 2.0000 | Freq: Two times a day (BID) | NASAL | Status: DC

## 2012-07-22 NOTE — Progress Notes (Signed)
[facility] [address], [place] [address] [id number]  Date:  07/22/2012   Name:  Shelly [name]   DOB:  1975-07-21   MRN:  [id number]  PCP:  Tally Due, MD    Chief Complaint: sinus congestion and Sore Throat   History of Present Illness:  Shelly [name] is a 37 y.o. very pleasant female patient who presents with the following:  History of sarcoid and AR.  Yesterday she noted onset of ST, and some "twinges" in her ears over the last 2 days.  Yesterday her symptoms got worse as the day went on.  She has noted PND last night.  She has not seen any sign of infection in her throat.  She thinks she may have had a fever yesterday as she felt hot and cold.   No body aches She does have some sneezing, and a dry cough.   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  She is no longer on OCP  She has also noted some tenderness in her LLQ for the last 2 or 3 days- only if she presses on the area.  Otherwise it does not bother her.  She is able to eat ok.  She has been told that she had an ovarian cyst in the past and wonders if this might be the culprit.  She has not noted any vaginal or urinary symptoms.    Her sarcoid is generally asymptomatic.  She has not seen Dr. Sherene Sires in several years.   Patient Active Problem List  Diagnosis  . SARCOIDOSIS  . RHINITIS, ACUTE  . TRACHEOBRONCHITIS  . ERYTHEMA NODOSUM  . ARTHRITIS  . DYSPNEA  . COUGH    Past Medical History  Diagnosis Date  . Allergy   . Sarcoidosis   . Migraines   . Depression   . Anxiety   . PFO (patent foramen ovale)   . TIA (transient ischemic attack)   . Arthritis   . Anemia   . GERD (gastroesophageal reflux disease)   . Stroke     Past Surgical History  Procedure Laterality Date  . Cholecystectomy    . Nasal sinus surgery    . Hand surgery    . Bronchoscopy     . Cesarean section      History  Substance Use Topics  . Smoking status: Current Every Day Smoker -- 1.00 packs/day for 22 years    Types: Cigarettes  . Smokeless tobacco: Not on file  . Alcohol Use: No    Family History  Problem Relation Age of Onset  . Osteoporosis Mother   . Asthma Mother   . Diabetes Father   . Heart disease Father   . Kidney disease Maternal Grandmother   . Heart disease Maternal Grandfather   . Cancer Paternal Grandmother   . Heart disease Paternal Grandfather     Allergies  Allergen Reactions  . Clindamycin/Lincomycin   . Keflex (Cephalexin)   . Penicillins   . Sulfonamide Derivatives     Medication list has been reviewed and updated.  Current Outpatient Prescriptions on File Prior to Visit  Medication Sig Dispense Refill  . aspirin 325 MG tablet Take 325 mg by mouth daily.      . cetirizine (ZYRTEC) 10 MG tablet Take 10 mg by mouth daily.      Marland Kitchen  Multiple Vitamin (MULTIVITAMIN) tablet Take 1 tablet by mouth daily.      Marland Kitchen venlafaxine XR (EFFEXOR-XR) 150 MG 24 hr capsule TAKE ONE CAPSULE BY MOUTH EVERY DAY  30 capsule  5  . azithromycin (ZITHROMAX) 500 MG tablet Take 1 tablet (500 mg total) by mouth daily.  7 tablet  0  . HYDROcodone-homatropine (HYCODAN) 5-1.5 MG/5ML syrup Take 5 mLs by mouth every 8 (eight) hours as needed for cough.  120 mL  0  . ipratropium (ATROVENT) 0.03 % nasal spray Place 2 sprays into the nose 2 (two) times daily.  30 mL  5  . norethindrone (MICRONOR,CAMILA,ERRIN) 0.35 MG tablet Take 1 tablet by mouth daily.      . predniSONE (DELTASONE) 10 MG tablet 6 tabs po d1, 5 tabs po d2, 4 tabs po d3, 3 tabs po d4, 2 tabs po d5, 1 tab po d6  21 tablet  0   No current facility-administered medications on file prior to visit.    Review of Systems:  As per HPI- otherwise negative.   Physical Examination: Filed Vitals:   07/22/12 0804  BP: 128/82  Pulse: 84  Temp: 98.9 F (37.2 C)  Resp: 16   Filed Vitals:   07/22/12  0804  Height: 5' 4.5" (1.638 m)  Weight: 198 lb (89.812 kg)   Body mass index is 33.47 kg/(m^2). Ideal Body Weight: Weight in (lb) to have BMI = 25: 147.6  GEN: WDWN, NAD, Non-toxic, A & O x 3, overweight HEENT: Atraumatic, Normocephalic. Neck supple. No masses, No LAD.  Bilateral TM wnl, oropharynx normal.  PEERL,EOMI.   Nasal cavity is congested and there is mucus in the right nostril Ears and Nose: No external deformity. CV: RRR, No M/G/R. No JVD. No thrill. No extra heart sounds. PULM: CTA B, no wheezes, crackles, rhonchi. No retractions. No resp. distress. No accessory muscle use. ABD: S,  ND, +BS. No rebound. No HSM.  Minimal tenderness to pressure in the LLQ EXTR: No c/c/e NEURO Normal gait.  PSYCH: Normally interactive. Conversant. Not depressed or anxious appearing.  Calm demeanor.   Results for orders placed in visit on 07/22/12  POCT CBC      Result Value Range   WBC 7.7  4.6 - 10.2 K/uL   Lymph, poc 1.8  0.6 - 3.4   POC LYMPH PERCENT 24.0  10 - 50 %L   MID (cbc) 0.5  0 - 0.9   POC MID % 5.9  0 - 12 %M   POC Granulocyte 5.4  2 - 6.9   Granulocyte percent 70.1  37 - 80 %G   RBC 4.11  4.04 - 5.48 M/uL   Hemoglobin 11.9 (*) 12.2 - 16.2 g/dL   HCT, POC 16.1 (*) 09.6 - 47.9 %   MCV 90.3  80 - 97 fL   MCH, POC 29.0  27 - 31.2 pg   MCHC 32.1  31.8 - 35.4 g/dL   RDW, POC 04.5     Platelet Count, POC 266  142 - 424 K/uL   MPV 9.7  0 - 99.8 fL  POCT RAPID STREP A (OFFICE)      Result Value Range   Rapid Strep A Screen Negative  Negative  POCT UA - MICROSCOPIC ONLY      Result Value Range   WBC, Ur, HPF, POC 0-2     RBC, urine, microscopic 0-1     Bacteria, U Microscopic trace     Mucus, UA trace  Epithelial cells, urine per micros 1-3     Crystals, Ur, HPF, POC neg     Casts, Ur, LPF, POC neg     Yeast, UA neg    POCT URINALYSIS DIPSTICK      Result Value Range   Color, UA yellow     Clarity, UA clear     Glucose, UA neg     Bilirubin, UA neg     Ketones,  UA neg     Spec Grav, UA <=1.005     Blood, UA neg     pH, UA 5.5     Protein, UA neg     Urobilinogen, UA 0.2     Nitrite, UA neg     Leukocytes, UA Negative    POCT URINE PREGNANCY      Result Value Range   Preg Test, Ur Negative      Assessment and Plan: Acute pharyngitis - Plan: POCT rapid strep A, ipratropium (ATROVENT) 0.03 % nasal spray  Abdominal pain, left lower quadrant - Plan: POCT CBC, POCT UA - Microscopic Only, POCT urinalysis dipstick, POCT urine pregnancy  Suspect that Parthenia has a viral URI and PND today.  Will have her use her atrovent NS and zyrtec, and let me know if not better.   Vague LLQ tenderness- this is only present if the area is pressed on.  Doubt diverticulitis given her age and normal wbc count.  Prefer not to do a CT if possible given her age and female sex.  However, counseled Reiley that close follow- up is needed.  She will let me know if the pain does not go away or if it gets worse.    Signed Abbe Amsterdam, MD

## 2012-07-22 NOTE — Patient Instructions (Addendum)
Use the zyrtec and atrovent nasal spray, drink plenty of fluids and rest. If your cold does not seem to be improving in the next few days please let me know- Sooner if worse.   Keep an eye on your abdominal tenderness.  If you start to have pain- not just tenderness with pressing on the area- let me know right away.  Come in or call right away if you are having pain, nausea or vomiting

## 2012-09-13 ENCOUNTER — Ambulatory Visit: Payer: BC Managed Care – PPO

## 2012-09-13 ENCOUNTER — Ambulatory Visit (INDEPENDENT_AMBULATORY_CARE_PROVIDER_SITE_OTHER): Payer: BC Managed Care – PPO | Admitting: Internal Medicine

## 2012-09-13 VITALS — BP 110/78 | HR 94 | Temp 98.1°F | Resp 16 | Ht 64.0 in | Wt 193.0 lb

## 2012-09-13 DIAGNOSIS — J029 Acute pharyngitis, unspecified: Secondary | ICD-10-CM

## 2012-09-13 DIAGNOSIS — R05 Cough: Secondary | ICD-10-CM

## 2012-09-13 DIAGNOSIS — F32A Depression, unspecified: Secondary | ICD-10-CM

## 2012-09-13 DIAGNOSIS — R509 Fever, unspecified: Secondary | ICD-10-CM

## 2012-09-13 DIAGNOSIS — F329 Major depressive disorder, single episode, unspecified: Secondary | ICD-10-CM

## 2012-09-13 DIAGNOSIS — R059 Cough, unspecified: Secondary | ICD-10-CM

## 2012-09-13 DIAGNOSIS — F3289 Other specified depressive episodes: Secondary | ICD-10-CM

## 2012-09-13 LAB — POCT CBC
Granulocyte percent: 74.2 %G (ref 37–80)
HCT, POC: 43.4 % (ref 37.7–47.9)
Hemoglobin: 14 g/dL (ref 12.2–16.2)
Lymph, poc: 2.3 (ref 0.6–3.4)
MCH, POC: 29.8 pg (ref 27–31.2)
MCHC: 32.3 g/dL (ref 31.8–35.4)
MCV: 92.4 fL (ref 80–97)
MID (cbc): 0.6 (ref 0–0.9)
MPV: 10.1 fL (ref 0–99.8)
POC Granulocyte: 8.2 — AB (ref 2–6.9)
POC LYMPH PERCENT: 20.7 %L (ref 10–50)
POC MID %: 5.1 %M (ref 0–12)
Platelet Count, POC: 348 10*3/uL (ref 142–424)
RBC: 4.7 M/uL (ref 4.04–5.48)
RDW, POC: 13.1 %
WBC: 11.1 10*3/uL — AB (ref 4.6–10.2)

## 2012-09-13 LAB — POCT RAPID STREP A (OFFICE): Rapid Strep A Screen: NEGATIVE

## 2012-09-13 MED ORDER — HYDROCODONE-HOMATROPINE 5-1.5 MG/5ML PO SYRP: 5.0000 mL | ORAL_SOLUTION | Freq: Three times a day (TID) | ORAL | Status: DC | PRN

## 2012-09-13 MED ORDER — AZITHROMYCIN 500 MG PO TABS: 500.0000 mg | ORAL_TABLET | Freq: Every day | ORAL | Status: DC

## 2012-09-13 MED ORDER — VENLAFAXINE HCL ER 150 MG PO CP24: 150.0000 mg | ORAL_CAPSULE | Freq: Every day | ORAL | Status: DC

## 2012-09-13 MED ORDER — IPRATROPIUM BROMIDE 0.03 % NA SOLN: 2.0000 | Freq: Two times a day (BID) | NASAL | Status: DC

## 2012-09-13 NOTE — Progress Notes (Signed)
  Subjective:    Patient ID: Shelly [name], female    DOB: 1975/09/06, 37 y.o.   MRN: [id number]  HPI 37 year old female presents with 5 day history of sore throat, cough, fever, chills, and nasal congestion.  Admits that her 68 year old son has been sick at home with similar symptoms, although he has improved and went back to school yesterday. Cough is productive of yellow/green sputum.  Denies chest pain, SOB, wheezing, or dizziness. Does complain of sinus pressure and a lot of PND and nasal discharge.  She has had temps at home between 99.0 and 100.0 degrees.   History of sarcoidosis. On Effexor XR for depression - stable and well controlled. Has been on this medication for over 10 years. Does request refill of this today. Otherwise doing well with no other concerns today.     Review of Systems  Constitutional: Positive for fever and chills.  HENT: Positive for congestion, sore throat, rhinorrhea and postnasal drip. Negative for ear pain and neck stiffness.   Respiratory: Positive for cough. Negative for shortness of breath and wheezing.   Gastrointestinal: Negative for nausea, vomiting and abdominal pain.  Neurological: Negative for dizziness and headaches.       Objective:   Physical Exam  Constitutional: She is oriented to person, place, and time. She appears well-developed and well-nourished.  HENT:  Head: Normocephalic and atraumatic.  Right Ear: Hearing, tympanic membrane, external ear and ear canal normal.  Left Ear: Hearing, tympanic membrane, external ear and ear canal normal.  Nose: Nose normal. Right sinus exhibits no maxillary sinus tenderness and no frontal sinus tenderness. Left sinus exhibits no maxillary sinus tenderness and no frontal sinus tenderness.  Mouth/Throat: Uvula is midline, oropharynx is clear and moist and mucous membranes are normal. No oropharyngeal exudate (2+ bilateral hypertrophic tonsils), posterior oropharyngeal edema, posterior oropharyngeal erythema  or tonsillar abscesses.  Eyes: Conjunctivae are normal.  Neck: Normal range of motion.  Cardiovascular: Normal rate, regular rhythm and normal heart sounds.   Pulmonary/Chest: Effort normal and breath sounds normal.  Lymphadenopathy:    She has no cervical adenopathy.  Neurological: She is alert and oriented to person, place, and time.  Psychiatric: She has a normal mood and affect. Her behavior is normal. Judgment and thought content normal.         [facility] reading (PRIMARY) by  Dr. Perrin Maltese as chronic right lower lobe atelectasis. No acute infiltrate or consolidation.   Assessment & Plan:  Cough - Plan: POCT CBC, DG Chest 2 View, azithromycin (ZITHROMAX) 500 MG tablet, HYDROcodone-homatropine (HYCODAN) 5-1.5 MG/5ML syrup  Fever, unspecified  Acute pharyngitis - Plan: POCT rapid strep A, ipratropium (ATROVENT) 0.03 % nasal spray  Depression - Plan: venlafaxine XR (EFFEXOR-XR) 150 MG 24 hr capsule Will go ahead and treat with zithromax 500 mg daily x 5 days Atrovent NS twice daily to help with congestion and PND Increase fluids and rest Effexor-XR refilled x 6 months Follow up if symptoms worsen or fail to improve.

## 2012-10-01 ENCOUNTER — Ambulatory Visit (INDEPENDENT_AMBULATORY_CARE_PROVIDER_SITE_OTHER): Payer: BC Managed Care – PPO | Admitting: Internal Medicine

## 2012-10-01 VITALS — BP 120/83 | HR 99 | Temp 98.0°F | Resp 16 | Ht 65.5 in | Wt 196.6 lb

## 2012-10-01 DIAGNOSIS — S9001XA Contusion of right ankle, initial encounter: Secondary | ICD-10-CM

## 2012-10-01 DIAGNOSIS — S9000XA Contusion of unspecified ankle, initial encounter: Secondary | ICD-10-CM

## 2012-10-01 DIAGNOSIS — J301 Allergic rhinitis due to pollen: Secondary | ICD-10-CM

## 2012-10-01 DIAGNOSIS — J019 Acute sinusitis, unspecified: Secondary | ICD-10-CM

## 2012-10-01 MED ORDER — PREDNISONE 20 MG PO TABS: ORAL_TABLET | ORAL | Status: DC

## 2012-10-01 MED ORDER — AZITHROMYCIN 500 MG PO TABS: 500.0000 mg | ORAL_TABLET | Freq: Every day | ORAL | Status: DC

## 2012-10-01 NOTE — Progress Notes (Signed)
  Subjective:    Patient ID: Shelly [name], female    DOB: 06/17/1975, 37 y.o.   MRN: [id number]  HPI at last visit was treated with Zithromax for respiratory infection that has not resolved She continues with coughing, worse at night with copious postnasal drainage whenever she is in the shower. She continues with fatigue. 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 Her allergies have continued to be a problem and she uses Atrovent nasal spray. She has intermittent sore throat worse in the morning. No wheezing. No fever chills or night sweats at this point although she had fever at the onset of her original illness.  She also had an injury last weekend banging her right ankle on a metal frame. She had lots of swelling and ecchymosis and continues with a tender area medially with touch or walking  Past Medical History  Diagnosis Date  . Allergy   . Sarcoidosis   . Migraines   . Depression   . Anxiety   . PFO (patent foramen ovale)   . TIA (transient ischemic attack)   . Arthritis   . Anemia   . GERD (gastroesophageal reflux disease)   . Stroke   Current outpatient prescriptions:aspirin 325 MG tablet, Take 325 mg by mouth daily., Disp: , Rfl: ;  cetirizine (ZYRTEC) 10 MG tablet, Take 10 mg by mouth daily., Disp: , Rfl: ;  ipratropium (ATROVENT) 0.03 % nasal spray, Place 2 sprays into the nose 2 (two) times daily., Disp: 30 mL, Rfl: 5;  Multiple Vitamin (MULTIVITAMIN) tablet, Take 1 tablet by mouth daily., Disp: , Rfl:  venlafaxine XR (EFFEXOR-XR) 150 MG 24 hr capsule, Take 1 capsule (150 mg total) by mouth daily., Disp: 30 capsule, Rfl: 5;    Review of Systems Noncontributory    Objective:   Physical Exam BP 120/83  Pulse 99  Temp(Src) 98 F (36.7 C) (Oral)  Resp 16  Ht 5' 5.5" (1.664 m)  Wt 196 lb 9.6 oz (89.177 kg)  BMI 32.21 kg/m2  SpO2 98%  LMP 09/11/2012 TMs clear Nares boggy with purulent/ tender maxillary areas Throat  clear and no cervical adenopathy Chest clear to auscultation   right ankle with tenderness on the medial malleolus but there is no swelling or ecchymosis in the ankle has a full range of motion without pain     Assessment & Plan:  Problem #1 prolonged sinusitis with cough Meds ordered this encounter  Medications  . azithromycin (ZITHROMAX) 500 MG tablet    Sig: Take 1 tablet (500 mg total) by mouth daily.    Dispense:  5 tablet    Refill:  0  . predniSONE (DELTASONE) 20 MG tablet    Sig: 3/3/2/2/1/1 single daily dose for 6 days    Dispense:  12 tablet    Refill:  0   continue Atrovent to cover allergic rhinitis  Problem #2 contusion of ankle Reassured that this should disappear without specific treatment

## 2013-03-16 ENCOUNTER — Other Ambulatory Visit: Payer: Self-pay | Admitting: Physician Assistant

## 2013-04-03 ENCOUNTER — Ambulatory Visit (INDEPENDENT_AMBULATORY_CARE_PROVIDER_SITE_OTHER): Payer: BC Managed Care – PPO

## 2013-07-15 ENCOUNTER — Ambulatory Visit (INDEPENDENT_AMBULATORY_CARE_PROVIDER_SITE_OTHER): Payer: BC Managed Care – PPO | Admitting: Internal Medicine

## 2013-07-15 VITALS — BP 120/82 | HR 112 | Temp 98.6°F | Resp 18 | Ht 65.0 in | Wt 201.0 lb

## 2013-07-15 DIAGNOSIS — J019 Acute sinusitis, unspecified: Secondary | ICD-10-CM

## 2013-07-15 MED ORDER — AZITHROMYCIN 250 MG PO TABS: ORAL_TABLET | ORAL | Status: DC

## 2013-07-15 NOTE — Progress Notes (Signed)
   Subjective:    Patient ID: Shelly [name], female    DOB: [birth year], 38 y.o.   MRN: [id number] This chart was scribed for Ellamae Siaobert Doolittle, MD by Nicholos Johnsenise Iheanachor, Medical Scribe. This patient's care was started at 5:15 PM.  Fever  Associated symptoms include coughing, ear pain and a sore throat.  Otalgia  Associated symptoms include coughing and a sore throat.  Abdominal Pain Associated symptoms include a fever.   HPI Comments: Shelly [name] is a 38 y.o. female who presents to the [facility] complaining of intermittent fever, intermittent sore throat, congestion, and bilateral ear pain greater on the left, initial onset 3 days ago. States fever presented 2 days ago. Cough presented today. States she started to feel better and went to work but has since gotten worse. Taking Zyrtec with minimal relief. Her abdominal symptoms have resolved.  Also reports intermittent left leg pain that feels swollen that radiates to the toes. States when pain is present her husband will shake her leg and the pain will subside. Has popped a hip out of place on the right side.  Review of Systems  Constitutional: Positive for fever.  HENT: Positive for ear pain and sore throat.   Respiratory: Positive for cough.    Objective:  Physical Exam  Vitals reviewed. Constitutional: She is oriented to person, place, and time. She appears well-developed and well-nourished. No distress.  HENT:  Head: Normocephalic and atraumatic.  Right Ear: External ear normal.  Mouth/Throat: Oropharynx is clear and moist.  Left TM: dull Naries have purulent discharge on the left.   Eyes: EOM are normal.  Neck: Neck supple.  Cardiovascular: Normal rate.   Pulmonary/Chest: Effort normal. No respiratory distress.  Musculoskeletal: Normal range of motion.  Straight leg raise to 90 on the left creates discomfort in the lumbar area There is tenderness over the left iliac crest and into the left  buttock Hip exam is normal Reflexes are intact Sensory is intact  Lymphadenopathy:    She has no cervical adenopathy.  Neurological: She is alert and oriented to person, place, and time.  Skin: Skin is warm and dry.  Psychiatric: She has a normal mood and affect. Her behavior is normal.   Assessment & Plan:    I have completed the patient encounter in its entirety as documented by the scribe, with editing by me where necessary. Robert P. Merla Richesoolittle, M.D. Acute sinusitis, unspecified  Meds ordered this encounter  Medications  -  12 hour Sudafed       . azithromycin (ZITHROMAX) 250 MG tablet    Sig: As packaged    Dispense:  6 tablet    Refill:  0   Lumbosacral pain with mild radicular symptoms  Physical therapy  Ibuprofen

## 2013-07-28 ENCOUNTER — Other Ambulatory Visit: Payer: Self-pay | Admitting: Physician Assistant

## 2013-07-28 ENCOUNTER — Ambulatory Visit (INDEPENDENT_AMBULATORY_CARE_PROVIDER_SITE_OTHER): Payer: BC Managed Care – PPO | Admitting: Family Medicine

## 2013-07-28 VITALS — BP 118/80 | HR 98 | Temp 98.0°F | Resp 16 | Ht 63.5 in | Wt 200.0 lb

## 2013-07-28 DIAGNOSIS — R112 Nausea with vomiting, unspecified: Secondary | ICD-10-CM

## 2013-07-28 DIAGNOSIS — D869 Sarcoidosis, unspecified: Secondary | ICD-10-CM

## 2013-07-28 LAB — POCT CBC
Granulocyte percent: 65.2 %G (ref 37–80)
HCT, POC: 44.5 % (ref 37.7–47.9)
Hemoglobin: 14.5 g/dL (ref 12.2–16.2)
Lymph, poc: 3 (ref 0.6–3.4)
MCH, POC: 29.5 pg (ref 27–31.2)
MCHC: 32.6 g/dL (ref 31.8–35.4)
MCV: 90.7 fL (ref 80–97)
MID (cbc): 0.4 (ref 0–0.9)
MPV: 10 fL (ref 0–99.8)
POC Granulocyte: 6.4 (ref 2–6.9)
POC LYMPH PERCENT: 30.5 %L (ref 10–50)
POC MID %: 4.3 %M (ref 0–12)
Platelet Count, POC: 364 10*3/uL (ref 142–424)
RBC: 4.91 M/uL (ref 4.04–5.48)
RDW, POC: 13.5 %
WBC: 9.8 10*3/uL (ref 4.6–10.2)

## 2013-07-28 LAB — POCT URINALYSIS DIPSTICK
Bilirubin, UA: NEGATIVE
Blood, UA: NEGATIVE
Glucose, UA: NEGATIVE
Leukocytes, UA: NEGATIVE
Nitrite, UA: NEGATIVE
Protein, UA: NEGATIVE
Spec Grav, UA: >=1.03
Urobilinogen, UA: 0.2
pH, UA: 6

## 2013-07-28 MED ORDER — PROMETHAZINE HCL 12.5 MG PO TABS: 12.5000 mg | ORAL_TABLET | Freq: Three times a day (TID) | ORAL | Status: DC | PRN

## 2013-07-28 NOTE — Progress Notes (Signed)
   Subjective:    Patient ID: Shelly [name], female    DOB: Sep 08, 1975, 38 y.o.   MRN: [id number]  HPI    Review of Systems     Objective:   Physical Exam   Results for orders placed in visit on 07/28/13  POCT CBC      Result Value Ref Range   WBC 9.8  4.6 - 10.2 K/uL   Lymph, poc 3.0  0.6 - 3.4   POC LYMPH PERCENT 30.5  10 - 50 %L   MID (cbc) 0.4  0 - 0.9   POC MID % 4.3  0 - 12 %M   POC Granulocyte 6.4  2 - 6.9   Granulocyte percent 65.2  37 - 80 %G   RBC 4.91  4.04 - 5.48 M/uL   Hemoglobin 14.5  12.2 - 16.2 g/dL   HCT, POC 78.244.5  95.637.7 - 47.9 %   MCV 90.7  80 - 97 fL   MCH, POC 29.5  27 - 31.2 pg   MCHC 32.6  31.8 - 35.4 g/dL   RDW, POC 21.313.5     Platelet Count, POC 364  142 - 424 K/uL   MPV 10.0  0 - 99.8 fL  POCT URINALYSIS DIPSTICK      Result Value Ref Range   Color, UA yellow     Clarity, UA clear     Glucose, UA neg     Bilirubin, UA neg     Ketones, UA trace     Spec Grav, UA >=1.030     Blood, UA neg     pH, UA 6.0     Protein, UA neg     Urobilinogen, UA 0.2     Nitrite, UA neg     Leukocytes, UA Negative          Assessment & Plan:  Nausea with vomiting - Plan: POCT CBC, POCT urinalysis dipstick, promethazine (PHENERGAN) 12.5 MG tablet  Sarcoidosis  Signed, Elvina SidleKurt Joziyah Roblero, MD

## 2013-07-28 NOTE — Patient Instructions (Signed)

## 2013-07-28 NOTE — Progress Notes (Signed)
This chart was scribed for Elvina SidleKurt Lauenstein, MD by Quintella ReichertMatthew Underwood, Scribe.  [facility] and the patient's care was started at 5:10 PM.  @[facility] @  Patient ID: Shelly [name] MRN: [id number], DOB: 20-May-1975, 38 y.o. 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  She states she awoke feeling weak and fatigued.  After taking a shower she became nauseated.  While getting ready for work she began vomiting.  She vomited 2 times in that episode but has not vomited since then.  She has been able to tolerate fluids and toast.  She also reports alternating hot and cold spells.  She states she has also begun to have a sore throat since arrival here.  She reports feeling slightly lightheaded and dizzy when she stands up.  She denies diarrhea or abdominal pain.  Pt admits to h/o cholecystectomy.  She has h/o sarcoidosis but has not been treated for this in ~5 years.  She denies significant pulmonary issues associated with her sarcoidosis.   Past Medical History  Diagnosis Date   Allergy    Sarcoidosis    Migraines    Depression    Anxiety    PFO (patent foramen ovale)    TIA (transient ischemic attack)    Arthritis    Anemia    GERD (gastroesophageal reflux disease)    Stroke      Home Meds: Prior to Admission medications   Medication Sig Start Date End Date Taking? Authorizing Provider  aspirin 325 MG tablet Take 325 mg by mouth daily.   Yes Historical Provider, MD  cetirizine (ZYRTEC) 10 MG tablet Take 10 mg by mouth daily.   Yes Historical Provider, MD  ipratropium (ATROVENT) 0.03 % nasal spray Place 2 sprays into the nose 2 (two) times daily. 09/13/12  Yes Heather Jaquita RectorM Marte, PA-C  Multiple Vitamin (MULTIVITAMIN) tablet Take 1 tablet by mouth daily.   Yes Historical Provider,  MD  venlafaxine XR (EFFEXOR-XR) 150 MG 24 hr capsule TAKE 1 CAPSULE (150 MG TOTAL) BY MOUTH DAILY. 03/16/13  Yes Heather M Marte, PA-C  venlafaxine XR (EFFEXOR-XR) 75 MG 24 hr capsule Take 75 mg by mouth daily with breakfast.   Yes Historical Provider, MD  azithromycin (ZITHROMAX) 250 MG tablet As packaged 07/15/13   Tonye Pearsonobert P Doolittle, MD    Allergies:  Allergies  Allergen Reactions   Clindamycin/Lincomycin    Keflex [Cephalexin]    Penicillins    Sulfonamide Derivatives     History   Social History   Marital Status: Married    Spouse Name: N/A    Number of Children: N/A   Years of Education: N/A   Occupational History   Not on file.   Social History Main Topics   Smoking status: Current Every Day Smoker -- 1.00 packs/day for 22 years    Types: Cigarettes   Smokeless tobacco: Not on file   Alcohol Use: No   Drug Use: No   Sexual Activity: Yes   Other Topics Concern   Not on file   Social History Narrative   No narrative on file     Review of Systems: Constitutional: positive for fatigue, alternating hot and cold spells.  Negative for weight changes HEENT: positive for sore throat.  negative for vision changes, hearing loss, congestion, rhinorrhea, epistaxis, or  sinus pressure Cardiovascular: negative for chest pain or palpitations Respiratory: negative for hemoptysis, wheezing, shortness of breath, or cough Abdominal: positive for nausea and vomiting.  negative for abdominal pain, diarrhea, or constipation Dermatological: negative for rash Neurologic: positive for dizziness and lightheadedness.  negative for syncope All other systems reviewed and are otherwise negative with the exception to those above and in the HPI.   Physical Exam: Blood pressure 118/80, pulse 98, temperature 98 F (36.7 C), temperature source Oral, resp. rate 16, height 5' 3.5" (1.613 m), weight 200 lb (90.719 kg), last menstrual period 07/23/2013, SpO2 98.00%., Body mass index  is 34.87 kg/(m^2). General: Well developed, well nourished, in no acute distress. Head: Normocephalic, atraumatic, eyes without discharge, sclera non-icteric, nares are without discharge. Bilateral auditory canals clear, TM's are without perforation, pearly grey and translucent with reflective cone of light bilaterally. Oral cavity moist, posterior pharynx without exudate, erythema, peritonsillar abscess, or post nasal drip.  Neck: Supple. No thyromegaly. Full ROM. No lymphadenopathy. Lungs: Clear bilaterally to auscultation without wheezes, rales, or rhonchi. Breathing is unlabored. Heart: RRR with S1 S2. No murmurs, rubs, or gallops appreciated. Abdomen: Soft, non-tender, non-distended with normoactive bowel sounds. No hepatomegaly. No rebound/guarding. No obvious abdominal masses. Msk:  Strength and tone normal for age. Extremities/Skin: Warm and dry. No clubbing or cyanosis. No edema. No rashes or suspicious lesions. Neuro: Alert and oriented X 3. Moves all extremities spontaneously. Gait is normal. CNII-XII grossly in tact. Psych:  Responds to questions appropriately with a normal affect.   Labs: Results for orders placed in visit on 07/28/13  POCT CBC      Result Value Ref Range   WBC 9.8  4.6 - 10.2 K/uL   Lymph, poc 3.0  0.6 - 3.4   POC LYMPH PERCENT 30.5  10 - 50 %L   MID (cbc) 0.4  0 - 0.9   POC MID % 4.3  0 - 12 %M   POC Granulocyte 6.4  2 - 6.9   Granulocyte percent 65.2  37 - 80 %G   RBC 4.91  4.04 - 5.48 M/uL   Hemoglobin 14.5  12.2 - 16.2 g/dL   HCT, POC 16.144.5  09.637.7 - 47.9 %   MCV 90.7  80 - 97 fL   MCH, POC 29.5  27 - 31.2 pg   MCHC 32.6  31.8 - 35.4 g/dL   RDW, POC 04.513.5     Platelet Count, POC 364  142 - 424 K/uL   MPV 10.0  0 - 99.8 fL  POCT URINALYSIS DIPSTICK      Result Value Ref Range   Color, UA yellow     Clarity, UA clear     Glucose, UA neg     Bilirubin, UA neg     Ketones, UA trace     Spec Grav, UA >=1.030     Blood, UA neg     pH, UA 6.0      Protein, UA neg     Urobilinogen, UA 0.2     Nitrite, UA neg     Leukocytes, UA Negative        ASSESSMENT AND PLAN:  38 y.o. year old female with   Nausea with vomiting - Plan: POCT CBC, POCT urinalysis dipstick    Signed, Elvina SidleKurt Lauenstein, MD 07/28/2013 5:12 PM

## 2013-09-14 ENCOUNTER — Other Ambulatory Visit: Payer: Self-pay | Admitting: Physician Assistant

## 2013-10-15 ENCOUNTER — Other Ambulatory Visit: Payer: Self-pay | Admitting: Internal Medicine

## 2013-11-01 ENCOUNTER — Ambulatory Visit (INDEPENDENT_AMBULATORY_CARE_PROVIDER_SITE_OTHER): Payer: BC Managed Care – PPO | Admitting: Family Medicine

## 2013-11-01 VITALS — BP 124/80 | HR 129 | Temp 98.6°F | Resp 18 | Ht 65.0 in | Wt 199.0 lb

## 2013-11-01 DIAGNOSIS — F329 Major depressive disorder, single episode, unspecified: Secondary | ICD-10-CM

## 2013-11-01 DIAGNOSIS — F32A Depression, unspecified: Secondary | ICD-10-CM

## 2013-11-01 DIAGNOSIS — F3289 Other specified depressive episodes: Secondary | ICD-10-CM

## 2013-11-01 MED ORDER — VENLAFAXINE HCL ER 150 MG PO CP24: 150.0000 mg | ORAL_CAPSULE | Freq: Every day | ORAL | Status: DC

## 2013-11-01 NOTE — Progress Notes (Signed)
Subjective:  This chart was scribed for [name], MD by Carl Best, Medical Scribe. This patient was seen in Room 8 and the patient's care was started at 2:20 PM.   Patient ID: Shelly [name], female    DOB: 1975-12-20, 38 y.o.   MRN: [id number]  HPI HPI Comments: Shelly [name] is a 38 y.o. female who presents to the [facility] for a refill on her Effexor.  The patient states that she was given 15 days worth of medication and told that she needed to see a physician before she could get another refill.  She states that she was originally prescribed 150mg  and then another 75mg  after she had a miscarriage in September and started suffering from depression.  She states that her gynecologist wants to eventually ween her off of the medication.  She states that her 75mg  dosage of Effexor is good for one year.  The patient states that she is no longer taking zithromax.  She states that she takes atrovent when needed, zyrtec, aspirin, and multivitamins.    The patient is also complaining of a painful lower abdominal pain that started a two weeks ago.  The patient states that the pain has woken her out of her sleep once.  She states that she has a history of ovarian cysts.  The patient states she is currently in the middle of her menstrual cycle.  Past Medical History  Diagnosis Date  . Allergy   . Sarcoidosis   . Migraines   . Depression   . Anxiety   . PFO (patent foramen ovale)   . TIA (transient ischemic attack)   . Arthritis   . Anemia   . GERD (gastroesophageal reflux disease)   . Stroke   . Miscarriage    Past Surgical History  Procedure Laterality Date  . Cholecystectomy    . Nasal sinus surgery    . Hand surgery    . Bronchoscopy    . Cesarean section     Family History  Problem Relation Age of Onset  . Osteoporosis Mother   . Asthma Mother   . Diabetes Father   . Heart disease Father   . Kidney disease Maternal Grandmother   . Heart  disease Maternal Grandfather   . Cancer Paternal Grandmother   . Heart disease Paternal Grandfather    History   Social History  . Marital Status: Married    Spouse Name: N/A    Number of Children: N/A  . Years of Education: N/A   Occupational History  . Not on file.   Social History Main Topics  . Smoking status: Current Every Day Smoker -- 1.00 packs/day for 22 years    Types: Cigarettes  . Smokeless tobacco: Not on file  . Alcohol Use: No  . Drug Use: No  . Sexual Activity: Yes   Other Topics Concern  . Not on file   Social History Narrative  . No narrative on file   Allergies  Allergen Reactions  . Clindamycin/Lincomycin   . Keflex [Cephalexin]   . Penicillins   . Sulfonamide Derivatives     Review of Systems  Gastrointestinal: Positive for abdominal pain.     Objective:  Physical Exam  Nursing note and vitals reviewed. Constitutional: She is oriented to person, place, and time. She appears well-developed and well-nourished.  HENT:  Head: Normocephalic and atraumatic.  Eyes: EOM are normal.  Neck: Normal range of motion.  Cardiovascular: Normal rate, regular  rhythm, normal heart sounds and intact distal pulses.   No murmur heard. Pulmonary/Chest: Effort normal and breath sounds normal. No respiratory distress. She has no wheezes. She has no rales.  Abdominal: Soft. There is no tenderness.  Musculoskeletal: Normal range of motion.  Neurological: She is alert and oriented to person, place, and time.  Skin: Skin is warm and dry.  Psychiatric: She has a normal mood and affect. Her behavior is normal.  Patient relates normally.     BP 124/80  Pulse 129  Temp(Src) 98.6 F (37 C) (Oral)  Resp 18  Ht 5\' 5"  (1.651 m)  Wt 199 lb (90.266 kg)  BMI 33.12 kg/m2  SpO2 97%  LMP 10/12/2013 Assessment & Plan:    I personally performed the services described in this documentation, which was scribed in my presence. The recorded information has been reviewed  and is accurate.  Depression - Plan: venlafaxine XR (EFFEXOR-XR) 150 MG 24 hr capsule  Signed, Elvina SidleKurt Harrold Fitchett, MD

## 2014-01-27 ENCOUNTER — Ambulatory Visit (INDEPENDENT_AMBULATORY_CARE_PROVIDER_SITE_OTHER): Payer: BC Managed Care – PPO | Admitting: Physician Assistant

## 2014-01-27 VITALS — BP 120/82 | HR 96 | Temp 98.9°F | Resp 18 | Ht 64.75 in | Wt 194.0 lb

## 2014-01-27 DIAGNOSIS — J3089 Other allergic rhinitis: Secondary | ICD-10-CM

## 2014-01-27 DIAGNOSIS — B9789 Other viral agents as the cause of diseases classified elsewhere: Secondary | ICD-10-CM

## 2014-01-27 DIAGNOSIS — R062 Wheezing: Secondary | ICD-10-CM

## 2014-01-27 DIAGNOSIS — F17200 Nicotine dependence, unspecified, uncomplicated: Secondary | ICD-10-CM

## 2014-01-27 DIAGNOSIS — D869 Sarcoidosis, unspecified: Secondary | ICD-10-CM

## 2014-01-27 DIAGNOSIS — J069 Acute upper respiratory infection, unspecified: Secondary | ICD-10-CM

## 2014-01-27 MED ORDER — FLUTICASONE PROPIONATE 50 MCG/ACT NA SUSP: 2.0000 | Freq: Every day | NASAL | Status: DC

## 2014-01-27 MED ORDER — ALBUTEROL SULFATE (2.5 MG/3ML) 0.083% IN NEBU
2.5000 mg | INHALATION_SOLUTION | Freq: Once | RESPIRATORY_TRACT | Status: AC
  Administered 2014-01-27: 2.5 mg via RESPIRATORY_TRACT

## 2014-01-27 MED ORDER — IPRATROPIUM BROMIDE 0.02 % IN SOLN
0.5000 mg | Freq: Once | RESPIRATORY_TRACT | Status: AC
  Administered 2014-01-27: 0.5 mg via RESPIRATORY_TRACT

## 2014-01-27 MED ORDER — VARENICLINE TARTRATE 0.5 MG X 11 & 1 MG X 42 PO MISC: ORAL | Status: DC

## 2014-01-27 MED ORDER — HYDROCOD POLST-CHLORPHEN POLST 10-8 MG/5ML PO LQCR: 5.0000 mL | Freq: Two times a day (BID) | ORAL | Status: DC | PRN

## 2014-01-27 MED ORDER — GUAIFENESIN ER 1200 MG PO TB12: 1.0000 | ORAL_TABLET | Freq: Two times a day (BID) | ORAL | Status: DC | PRN

## 2014-01-27 MED ORDER — ALBUTEROL SULFATE HFA 108 (90 BASE) MCG/ACT IN AERS: 2.0000 | INHALATION_SPRAY | RESPIRATORY_TRACT | Status: DC | PRN

## 2014-01-27 MED ORDER — AZITHROMYCIN 250 MG PO TABS: ORAL_TABLET | ORAL | Status: DC

## 2014-01-27 MED ORDER — VARENICLINE TARTRATE 1 MG PO TABS: 1.0000 mg | ORAL_TABLET | Freq: Two times a day (BID) | ORAL | Status: DC

## 2014-01-27 MED ORDER — PREDNISONE 10 MG PO TABS: ORAL_TABLET | ORAL | Status: DC

## 2014-01-27 NOTE — Progress Notes (Signed)
IDENTIFYING INFORMATION  Shelly [name] / female / 1975/07/22 / 38 y.o. / MRN: [id number]  The patient has SARCOIDOSIS; RHINITIS, ACUTE; TRACHEOBRONCHITIS; ERYTHEMA NODOSUM; ARTHRITIS; DYSPNEA; and COUGH on her problem list.  SUBJECTIVE  Chief Complaint: had a chief complaint of Sinus Congestion and additional complaints of Chest Congestion, Chills, and Cough.  History of present illness:  This started last Thursday in the afternoon with HA and sinus pressure.  She was very congested at that time, and unable to excrete mucous.  She lost her voice on Friday morning and also had fatigue and malaise.  She later developed muscle aches.  She reports a low grade fever as of yesterday morning, and takes ASA 325 in the morning. She is a Engineer, siteschool teacher.     She is interested in smoking cessation at this time, and would like to discuss medication options.  She has tried nicotine patches before, but says that made her "wired."   There is no immunization history on file for this patient.  The patient has a current medication list which includes the following prescription(s): aspirin, cetirizine, ipratropium, multivitamin, venlafaxine xr, and venlafaxine xr.  Shelly [name] is allergic to clindamycin/lincomycin; keflex; penicillins; and sulfonamide derivatives. and she  reports that she has been smoking Cigarettes.  She has a 22 pack-year smoking history. She does not have any smokeless tobacco history on file. She reports that she does not drink alcohol or use illicit drugs.  The patient  has past surgical history that includes Cholecystectomy; Nasal sinus surgery; Hand surgery; Bronchoscopy; and Cesarean section. and her family history includes Asthma in her mother; Cancer in her paternal grandmother; Diabetes in her father; Heart disease in her father, maternal grandfather, and paternal grandfather; Kidney disease in her maternal grandmother; Osteoporosis in her mother.  Review of Systems    Constitutional: Positive for fever, malaise/fatigue and diaphoresis. Negative for chills.  HENT: Positive for congestion and sore throat. Negative for ear discharge and ear pain.   Eyes:       Itchy eyes  Respiratory: Positive for cough, sputum production and shortness of breath (2/2 exertion and coughing). Negative for hemoptysis and wheezing.   Cardiovascular: Positive for chest pain (2/2 coughing). Negative for palpitations, orthopnea, claudication and PND.  Gastrointestinal: Negative.   Musculoskeletal: Positive for myalgias. Negative for joint pain.  Skin: Negative.   Neurological: Positive for dizziness (post tussive) and headaches. Negative for weakness.    OBJECTIVE  Blood pressure 120/82, pulse 119, temperature 98.9 F (37.2 C), temperature source Oral, resp. rate 18, height 5' 4.75" (1.645 m), weight 194 lb (87.998 kg), last menstrual period 01/07/2014, SpO2 97.00%.  Physical Exam  Vitals reviewed. Constitutional: She is oriented to person, place, and time and well-developed, well-nourished, and in no distress.  HENT:  Right Ear: Hearing, tympanic membrane and external ear normal.  Left Ear: Hearing, tympanic membrane and external ear normal.  Nose: Rhinorrhea present.  Mouth/Throat: Uvula is midline and mucous membranes are normal. Posterior oropharyngeal erythema present. No oropharyngeal exudate or posterior oropharyngeal edema.  Cardiovascular: Normal rate, regular rhythm and normal heart sounds.   HR: 96  Pulmonary/Chest: She has wheezes (End expiratory) in the right upper field, the right middle field, the right lower field, the left upper field and the left middle field.  Lymphadenopathy:       Head (right side): No submental, no submandibular, no tonsillar, no preauricular, no posterior auricular and no occipital adenopathy present.       Head (  left side): No submental, no submandibular, no tonsillar, no preauricular, no posterior auricular and no occipital  adenopathy present.    She has no cervical adenopathy.  Neurological: She is alert and oriented to person, place, and time.  Skin: She is not diaphoretic.   Post Duoneb treatment: Inspiratory and expiratory wheezes in all lung fields.    ASSESSMENT & PLAN  Wheezing on expiration - Plan: albuterol (PROVENTIL) (2.5 MG/3ML) 0.083% nebulizer solution 2.5 mg, ipratropium (ATROVENT) nebulizer solution 0.5 mg, albuterol (PROVENTIL HFA;VENTOLIN HFA) 108 (90 BASE) MCG/ACT inhaler.  Viral URI with cough - Plan: Guaifenesin (MUCINEX MAXIMUM STRENGTH) 1200 MG TB12, albuterol (PROVENTIL HFA;VENTOLIN HFA) 108 (90 BASE) MCG/ACT inhaler, chlorpheniramine-HYDROcodone (TUSSIONEX PENNKINETIC ER) 10-8 MG/5ML LQCR  Smoking addiction - Plan: varenicline (CHANTIX STARTING MONTH PAK) 0.5 MG X 11 & 1 MG X 42 tablet, varenicline (CHANTIX CONTINUING MONTH PAK) 1 MG tablet.  Patient given instructions to start this medicine 7 days prior to her quit date.    Environmental and seasonal allergies - Plan: fluticasone (FLONASE) 50 MCG/ACT nasal spray.    Sarcoidosis - Plan: azithromycin (ZITHROMAX) 250 MG tablet, predniSONE (DELTASONE) 10 MG tablet.  Given the potential for complication in the complex patient, myself and Ms. Huey RomansSara Weber PA-C felt antibiotics a prudent choice for treating or preventing a potential bacterial infection.  She [facility]  She has agreed to ask the specialist to route documentation to this office.    The patient was instructed to to call or comeback to clinic as needed, or should symptoms warrant.  Deliah BostonMichael Clark, MS, PA-C [facility] 01/27/2014 10:59 AM

## 2014-01-27 NOTE — Progress Notes (Signed)
I was directly involved with the patient's care and agree with the physical, diagnosis and treatment plan.  

## 2014-02-02 ENCOUNTER — Ambulatory Visit (INDEPENDENT_AMBULATORY_CARE_PROVIDER_SITE_OTHER): Payer: BC Managed Care – PPO | Admitting: Emergency Medicine

## 2014-02-02 VITALS — BP 118/72 | HR 112 | Temp 99.2°F | Resp 18 | Ht 64.0 in | Wt 198.0 lb

## 2014-02-02 DIAGNOSIS — B9789 Other viral agents as the cause of diseases classified elsewhere: Principal | ICD-10-CM

## 2014-02-02 DIAGNOSIS — J0100 Acute maxillary sinusitis, unspecified: Secondary | ICD-10-CM

## 2014-02-02 DIAGNOSIS — J069 Acute upper respiratory infection, unspecified: Secondary | ICD-10-CM

## 2014-02-02 DIAGNOSIS — J209 Acute bronchitis, unspecified: Secondary | ICD-10-CM

## 2014-02-02 MED ORDER — ALBUTEROL SULFATE (2.5 MG/3ML) 0.083% IN NEBU
5.0000 mg | INHALATION_SOLUTION | Freq: Once | RESPIRATORY_TRACT | Status: AC
  Administered 2014-02-02: 5 mg via RESPIRATORY_TRACT

## 2014-02-02 MED ORDER — HYDROCOD POLST-CHLORPHEN POLST 10-8 MG/5ML PO LQCR: 5.0000 mL | Freq: Two times a day (BID) | ORAL | Status: DC | PRN

## 2014-02-02 MED ORDER — LEVOFLOXACIN 500 MG PO TABS: 500.0000 mg | ORAL_TABLET | Freq: Every day | ORAL | Status: AC

## 2014-02-02 MED ORDER — IPRATROPIUM BROMIDE 0.02 % IN SOLN
0.5000 mg | Freq: Once | RESPIRATORY_TRACT | Status: AC
  Administered 2014-02-02: 0.5 mg via RESPIRATORY_TRACT

## 2014-02-02 MED ORDER — PSEUDOEPHEDRINE-GUAIFENESIN ER 60-600 MG PO TB12: 1.0000 | ORAL_TABLET | Freq: Two times a day (BID) | ORAL | Status: DC

## 2014-02-02 NOTE — Addendum Note (Signed)
Addended by: Nita SellsSMITH, Angelea Penny S on: 02/02/2014 07:52 PM   Modules accepted: Orders

## 2014-02-02 NOTE — Patient Instructions (Signed)
Metered Dose Inhaler (No Spacer Used)  Inhaled medicines are the basis of treatment for asthma and other breathing problems. Inhaled medicine can only be effective if used properly. Good technique assures that the medicine reaches the lungs.  Metered dose inhalers (MDIs) are used to deliver a variety of inhaled medicines. These include quick relief or rescue medicines (such as bronchodilators) and controller medicines (such as corticosteroids). The medicine is delivered by pushing down on a metal canister to release a set amount of spray.  If you are using different kinds of inhalers, use your quick relief medicine to open the airways 10-15 minutes before using a steroid, if instructed to do so by your health care provider. If you are unsure which inhalers to use and the order of using them, ask your health care provider, nurse, or respiratory therapist.  HOW TO USE THE INHALER  1. Remove the cap from the inhaler.  2. If you are using the inhaler for the first time, you will need to prime it. Shake the inhaler for 5 seconds and release four puffs into the air, away from your face. Ask your health care provider or pharmacist if you have questions about priming your inhaler.  3. Shake the inhaler for 5 seconds before each breath in (inhalation).  4. Position the inhaler so that the top of the canister faces up.  5. Put your index finger on the top of the medicine canister. Your thumb supports the bottom of the inhaler.  6. Open your mouth.  7. Either place the inhaler between your teeth and place your lips tightly around the mouthpiece, or hold the inhaler 1-2 inches away from your open mouth. If you are unsure of which technique to use, ask your health care provider.  8. Breathe out (exhale) normally and as completely as possible.  9. Press the canister down with the index finger to release the medicine.  10. At the same time as the canister is pressed, inhale deeply and slowly until your lungs are completely filled.  This should take 4-6 seconds. Keep your tongue down.  11. Hold the medicine in your lungs for 5-10 seconds (10 seconds is best). This helps the medicine get into the small airways of your lungs.  12. Breathe out slowly, through pursed lips. Whistling is an example of pursed lips.  13. Wait at least 1 minute between puffs. Continue with the above steps until you have taken the number of puffs your health care provider has ordered. Do not use the inhaler more than your health care provider directs you to.  14. Replace the cap on the inhaler.  15. Follow the directions from your health care provider or the inhaler insert for cleaning the inhaler.  If you are using a steroid inhaler, after your last puff, rinse your mouth with water, gargle, and spit out the water. Do not swallow the water.  AVOID:  · Inhaling before or after starting the spray of medicine. It takes practice to coordinate your breathing with triggering the spray.  · Inhaling through the nose (rather than the mouth) when triggering the spray.  HOW TO DETERMINE IF YOUR INHALER IS FULL OR NEARLY EMPTY  You cannot know when an inhaler is empty by shaking it. Some inhalers are now being made with dose counters. Ask your health care provider for a prescription that has a dose counter if you feel you need that extra help. If your inhaler does not have a counter, ask your health care   provider to help you determine the date you need to refill your inhaler. Write the refill date on a calendar or your inhaler canister. Refill your inhaler 7-10 days before it runs out. Be sure to keep an adequate supply of medicine. This includes making sure it has not expired, and making sure you have a spare inhaler.  SEEK MEDICAL CARE IF:  · Symptoms are only partially relieved with your inhaler.  · You are having trouble using your inhaler.  · You experience an increase in phlegm.  SEEK IMMEDIATE MEDICAL CARE IF:  · You feel little or no relief with your inhalers. You are still  wheezing and feeling shortness of breath, tightness in your chest, or both.  · You have dizziness, headaches, or a fast heart rate.  · You have chills, fever, or night sweats.  · There is a noticeable increase in phlegm production, or there is blood in the phlegm.  MAKE SURE YOU:  · Understand these instructions.  · Will watch your condition.  · Will get help right away if you are not doing well or get worse.  Document Released: 01/29/2007 Document Revised: 08/18/2013 Document Reviewed: 09/19/2012  [employer]® Patient Information ©2015 [employer]. This information is not intended to replace advice given to you by your health care provider. Make sure you discuss any questions you have with your health care provider.

## 2014-02-02 NOTE — Progress Notes (Signed)
[facility] [address], [place] [id number] [phone]  Date:  02/02/2014   Name:  Shelly [name]   DOB:  08-13-1975   MRN:  [id number]  PCP:  Tally DueGUEST, CHRIS WARREN, MD    Chief Complaint: Sore Throat, Otalgia, chest tigtness and Facial Pain   History of Present Illness:  Shelly [name] is a 38 y.o. very pleasant female patient who presents with the following:  Ill since last week with nasal congestion and no nasal discharge.  Has a purulent post nasal drainage. Non productive cough.  Still wheezing and exertional shortness of breath Sore throat and left ear pain. Transient improvement with meds last week but no lasting improvement. No improvement with over the counter medications or other home remedies.  Denies other complaint or health concern today.   Patient Active Problem List   Diagnosis Date Noted  . SARCOIDOSIS 04/05/2007  . RHINITIS, ACUTE 04/05/2007  . TRACHEOBRONCHITIS 04/05/2007  . ERYTHEMA NODOSUM 04/05/2007  . ARTHRITIS 04/05/2007  . DYSPNEA 04/05/2007  . COUGH 04/05/2007    Past Medical History  Diagnosis Date  . Allergy   . Sarcoidosis   . Migraines   . Depression   . Anxiety   . PFO (patent foramen ovale)   . TIA (transient ischemic attack)   . Arthritis   . Anemia   . GERD (gastroesophageal reflux disease)   . Stroke   . Miscarriage     Past Surgical History  Procedure Laterality Date  . Cholecystectomy    . Nasal sinus surgery    . Hand surgery    . Bronchoscopy    . Cesarean section      History  Substance Use Topics  . Smoking status: Current Every Day Smoker -- 1.00 packs/day for 22 years    Types: Cigarettes  . Smokeless tobacco: Not on file  . Alcohol Use: No    Family History  Problem Relation Age of Onset  . Osteoporosis Mother   . Asthma Mother   . Diabetes Father   . Heart disease Father   . Kidney disease Maternal Grandmother   . Heart disease Maternal Grandfather   . Cancer Paternal  Grandmother   . Heart disease Paternal Grandfather     Allergies  Allergen Reactions  . Clindamycin/Lincomycin   . Keflex [Cephalexin]   . Penicillins   . Sulfonamide Derivatives     Medication list has been reviewed and updated.  Current Outpatient Prescriptions on File Prior to Visit  Medication Sig Dispense Refill  . albuterol (PROVENTIL HFA;VENTOLIN HFA) 108 (90 BASE) MCG/ACT inhaler Inhale 2 puffs into the lungs every 4 (four) hours as needed for wheezing or shortness of breath (cough, shortness of breath or wheezing.).  1 Inhaler  1  . aspirin 325 MG tablet Take 325 mg by mouth daily.      . cetirizine (ZYRTEC) 10 MG tablet Take 10 mg by mouth daily.      . fluticasone (FLONASE) 50 MCG/ACT nasal spray Place 2 sprays into both nostrils daily.  16 g  12  . Guaifenesin (MUCINEX MAXIMUM STRENGTH) 1200 MG TB12 Take 1 tablet (1,200 mg total) by mouth every 12 (twelve) hours as needed.  14 tablet  1  . ipratropium (ATROVENT) 0.03 % nasal spray Place 2 sprays into the nose 2 (two) times daily.  30 mL  5  . Multiple Vitamin (MULTIVITAMIN) tablet Take 1 tablet by mouth daily.      . varenicline (  CHANTIX STARTING MONTH PAK) 0.5 MG X 11 & 1 MG X 42 tablet Take one 0.5 mg tablet by mouth once daily for 3 days, then increase to one 0.5 mg tablet twice daily for 4 days, then increase to one 1 mg tablet twice daily.  53 tablet  0  . venlafaxine XR (EFFEXOR-XR) 150 MG 24 hr capsule Take 1 capsule (150 mg total) by mouth daily.  30 capsule  11  . venlafaxine XR (EFFEXOR-XR) 75 MG 24 hr capsule Take 75 mg by mouth daily with breakfast.      . azithromycin (ZITHROMAX) 250 MG tablet Take 2 tabs PO x 1 dose, then 1 tab PO QD x 4 days  6 tablet  0  . chlorpheniramine-HYDROcodone (TUSSIONEX PENNKINETIC ER) 10-8 MG/5ML LQCR Take 5 mLs by mouth every 12 (twelve) hours as needed for cough (cough).  70 mL  0  . predniSONE (DELTASONE) 10 MG tablet 6-1 taper-Start with 6 pills on the 1st day and decreased each  day by one pill. Take pills for that day all together in the am with food.  21 tablet  0  . varenicline (CHANTIX CONTINUING MONTH PAK) 1 MG tablet Take 1 tablet (1 mg total) by mouth 2 (two) times daily.  1 tablet  1   No current facility-administered medications on file prior to visit.    Review of Systems:  As per HPI, otherwise negative.    Physical Examination: Filed Vitals:   02/02/14 1821  BP: 118/72  Pulse: 112  Temp: 99.2 F (37.3 C)  Resp: 18   Filed Vitals:   02/02/14 1821  Height: 5\' 4"  (1.626 m)  Weight: 198 lb (89.812 kg)   Body mass index is 33.97 kg/(m^2). Ideal Body Weight: Weight in (lb) to have BMI = 25: 145.3  GEN: WDWN, NAD, Non-toxic, A & O x 3 HEENT: Atraumatic, Normocephalic. Neck supple. No masses, No LAD. Ears and Nose: No external deformity. CV: RRR, No M/G/R. No JVD. No thrill. No extra heart sounds. PULM: CTA B, no wheezes, crackles, rhonchi. No retractions. No resp. distress. No accessory muscle use. ABD: S, NT, ND, +BS. No rebound. No HSM. EXTR: No c/c/e NEURO Normal gait.  PSYCH: Normally interactive. Conversant. Not depressed or anxious appearing.  Calm demeanor.    Assessment and Plan: Sinusitis Bronchitis bronchospasm Neb levaquin mucinex d tussionex   Signed,  Phillips OdorJeffery Trennon Torbeck, MD

## 2014-06-05 DIAGNOSIS — K219 Gastro-esophageal reflux disease without esophagitis: Secondary | ICD-10-CM | POA: Insufficient documentation

## 2014-06-05 DIAGNOSIS — F17200 Nicotine dependence, unspecified, uncomplicated: Secondary | ICD-10-CM | POA: Insufficient documentation

## 2014-06-05 DIAGNOSIS — Z7689 Persons encountering health services in other specified circumstances: Secondary | ICD-10-CM | POA: Insufficient documentation

## 2014-06-05 DIAGNOSIS — IMO0002 Reserved for concepts with insufficient information to code with codable children: Secondary | ICD-10-CM | POA: Insufficient documentation

## 2014-08-10 HISTORY — PX: OTHER SURGICAL HISTORY: SHX169

## 2014-08-16 ENCOUNTER — Ambulatory Visit (INDEPENDENT_AMBULATORY_CARE_PROVIDER_SITE_OTHER): Payer: BC Managed Care – PPO | Admitting: Physician Assistant

## 2014-08-16 VITALS — BP 130/84 | HR 103 | Temp 98.1°F | Ht 64.75 in | Wt 197.1 lb

## 2014-08-16 DIAGNOSIS — L249 Irritant contact dermatitis, unspecified cause: Secondary | ICD-10-CM | POA: Diagnosis not present

## 2014-08-16 MED ORDER — FLUTICASONE PROPIONATE 0.05 % EX CREA: TOPICAL_CREAM | Freq: Two times a day (BID) | CUTANEOUS | Status: DC

## 2014-08-16 NOTE — Progress Notes (Signed)
Subjective:    Patient ID: Shelly [name], female    DOB: 1975/08/06, 39 y.o.   MRN: [id number]  Chief Complaint  Patient presents with  . Poison Ivy    On both arms and her sides of her abdomen and migrating to front-started about 2 weeks ago after working in the yard. Sx's didnt appear until about a week ago. Tried using Calamine lotion & Benadryl (no help)   Patient Active Problem List   Diagnosis Date Noted  . SARCOIDOSIS 04/05/2007  . RHINITIS, ACUTE 04/05/2007  . TRACHEOBRONCHITIS 04/05/2007  . ARTHRITIS 04/05/2007  . DYSPNEA 04/05/2007   Prior to Admission medications   Medication Sig Start Date End Date Taking? Authorizing Provider  aspirin 325 MG tablet Take 325 mg by mouth daily.   Yes Historical Provider, MD  cetirizine (ZYRTEC) 10 MG tablet Take 10 mg by mouth daily.   Yes Historical Provider, MD  ibuprofen (ADVIL,MOTRIN) 800 MG tablet Take 800 mg by mouth every 6 (six) hours as needed.   Yes Historical Provider, MD  Multiple Vitamin (MULTIVITAMIN) tablet Take 1 tablet by mouth daily.   Yes Historical Provider, MD  omeprazole (PRILOSEC OTC) 20 MG tablet Take 20 mg by mouth daily.   Yes Historical Provider, MD  venlafaxine XR (EFFEXOR-XR) 150 MG 24 hr capsule Take 1 capsule (150 mg total) by mouth daily. 11/01/13  Yes [name], MD  fluticasone (CUTIVATE) 0.05 % cream Apply topically 2 (two) times daily. 08/16/14   Donnajean Lopes, PA   Medications, allergies, past medical history, surgical history, family history, social history and problem list reviewed and updated.  HPI  38 yof presents with rash past 1-2 wks.   Out in yard at new home clearing brush and cleaning 2 wks ago. Had t-shirt and long pants on. Approx 4 days later noticed itchy bumps on bilateral forearms. Has been applying calamine lotion with no relief. Has not applied steroid topicals. Benadryl helped a small amnt with itching but made her drowsy.   For past week had also noticed same bumps on mid  back both sides and right neck. All areas are itchy.   No new meds, foods, detergents, lotions, creams. Nobody else in house has rash.   Review of Systems No fevers, chills.     Objective:   Physical Exam  Constitutional: She is oriented to person, place, and time. She appears well-developed and well-nourished.  Non-toxic appearance. She does not have a sickly appearance. She does not appear ill. No distress.  BP 130/84 mmHg  Pulse 103  Temp(Src) 98.1 F (36.7 C) (Oral)  Ht 5' 4.75" (1.645 m)  Wt 197 lb 2 oz (89.415 kg)  BMI 33.04 kg/m2  SpO2 97%  LMP 05/18/2014   Neurological: She is alert and oriented to person, place, and time.  Skin:     Circled areas --> Patchy areas of papules with underlying erythema and slight drying of overlying skin. Areas over flexural elbows and forearms slightly lichenified.       Assessment & Plan:   11 yof presents with rash past 1-2 wks.   Irritant contact dermatitis - Plan: fluticasone (CUTIVATE) 0.05 % cream --contact dermatitis, unsure what exposure was in backyard, denies any new substances used in the house --medium potency topical steroid bid for 2 wks --> medium potency as large portion of the affected skin is over elbow flexural areas --antihistamines for itching --rtc if no relief 2 wks or if improves then worsens again after tx  Donnajean Lopesodd M. Rushi Chasen, PA-C Physician Assistant-Certified [facility]  08/16/2014 3:23 PM

## 2014-08-16 NOTE — Patient Instructions (Signed)
Please apply the steroid topical twice daily for 2 weeks.  Take the zyrtec daily for the itching and possibly the benadryl once at night.  Please come back to see us if the rash returns or does not improve with this, as it could be coming from something else.

## 2014-09-09 DIAGNOSIS — Z8673 Personal history of transient ischemic attack (TIA), and cerebral infarction without residual deficits: Secondary | ICD-10-CM | POA: Insufficient documentation

## 2014-09-09 DIAGNOSIS — Q211 Atrial septal defect: Secondary | ICD-10-CM | POA: Insufficient documentation

## 2014-09-09 DIAGNOSIS — Q2112 Patent foramen ovale: Secondary | ICD-10-CM | POA: Insufficient documentation

## 2014-10-29 ENCOUNTER — Other Ambulatory Visit: Payer: Self-pay | Admitting: Family Medicine

## 2014-11-25 ENCOUNTER — Other Ambulatory Visit: Payer: Self-pay | Admitting: Family Medicine

## 2014-12-01 ENCOUNTER — Ambulatory Visit (INDEPENDENT_AMBULATORY_CARE_PROVIDER_SITE_OTHER): Payer: BC Managed Care – PPO | Admitting: Family Medicine

## 2014-12-01 VITALS — BP 120/78 | HR 105 | Temp 98.5°F | Resp 18 | Ht 64.25 in | Wt 198.5 lb

## 2014-12-01 DIAGNOSIS — G43809 Other migraine, not intractable, without status migrainosus: Secondary | ICD-10-CM | POA: Diagnosis not present

## 2014-12-01 DIAGNOSIS — F32A Depression, unspecified: Secondary | ICD-10-CM

## 2014-12-01 DIAGNOSIS — J3489 Other specified disorders of nose and nasal sinuses: Secondary | ICD-10-CM | POA: Diagnosis not present

## 2014-12-01 DIAGNOSIS — J309 Allergic rhinitis, unspecified: Secondary | ICD-10-CM | POA: Diagnosis not present

## 2014-12-01 DIAGNOSIS — F329 Major depressive disorder, single episode, unspecified: Secondary | ICD-10-CM | POA: Diagnosis not present

## 2014-12-01 MED ORDER — VENLAFAXINE HCL ER 150 MG PO CP24: 150.0000 mg | ORAL_CAPSULE | Freq: Every day | ORAL | Status: DC

## 2014-12-01 NOTE — Patient Instructions (Addendum)
Restart flonase, Saline nasal spray at least 4 times per day for allergies. If discolored nasal discharge and sinus pressure does not improve into Monday of next week - call me and I will call you in an antibiotic. Return to the clinic or go to the nearest emergency room if any of your symptoms worsen or new symptoms occur.  Ok to remain on same dose of Effexor for now. Do not miss dose, and would discuss plan prior to decreasing dose.  Follow up with any new provider here for physical in next 6 months.

## 2014-12-01 NOTE — Progress Notes (Signed)
Subjective:   This chart was scribed for [name], MD by [name], Medical Scribe. This patient was seen in Room 2 and the patient's care was started at 7:26 PM.    Patient ID: Shelly [name], female    DOB: 05-05-1975, 39 y.o.   MRN: [id number]  Chief Complaint  Patient presents with  . Medication Refill    Need refill on Effexor  . Sinus Problem    C/O face feels heavy, hears popping, PND, & facial pressure x 3-4 days    HPI Shelly [name] is a 39 y.o. female  PCP: [name], [name], MD  Medication Refill Pt is here for refill for her Effexor medication. She takes 150 mg QD. She was last prescribed for depression and migraines by [name] in July 2015. She has been on the medication for 12 years. She has to have the dose upped in the past when she had a miscarriage and was taking 225 mg but she has now been back down. She states that the dose helps with her migraines and depression. She denies any suicidal ideations. Depression screen [facility] 2/9 12/01/2014 08/16/2014  Decreased Interest 0 2  Down, Depressed, Hopeless 0 2  PHQ - 2 Score 0 4  Altered sleeping - 2  Tired, decreased energy - 3  Change in appetite - 0  Feeling bad or failure about yourself  - 2  Trouble concentrating - 1  Moving slowly or fidgety/restless - 0  Suicidal thoughts - 0  PHQ-9 Score - 12  Difficult doing work/chores - Somewhat difficult   Sinus Pressure She is complaining of constant, gradually worsening, sinus pressure onset 3 days. She states the sinus pressure is worse in the morning. She reports associated irritated throat, PND, congestion and bilateral ear fullness. She has a h/o seasonal allergies for which she takes Zyrtec. He has Flonase at home. She notes when she blows her nose the phlegm is a discolored yellow and green color. She denies any fever, chills, or cough.    Patient Active Problem List   Diagnosis Date Noted  . SARCOIDOSIS 04/05/2007  . RHINITIS, ACUTE  04/05/2007  . TRACHEOBRONCHITIS 04/05/2007  . ARTHRITIS 04/05/2007  . DYSPNEA 04/05/2007   Past Medical History  Diagnosis Date  . Allergy   . Sarcoidosis   . Migraines   . Depression   . Anxiety   . PFO (patent foramen ovale)   . TIA (transient ischemic attack)   . Arthritis   . Anemia   . GERD (gastroesophageal reflux disease)   . Stroke   . Miscarriage    Past Surgical History  Procedure Laterality Date  . Cholecystectomy    . Nasal sinus surgery    . Hand surgery    . Bronchoscopy    . Cesarean section      miscarriage  . Miscarriage  08/10/14   Allergies  Allergen Reactions  . Clindamycin/Lincomycin   . Keflex [Cephalexin]   . Penicillins   . Sulfonamide Derivatives    Prior to Admission medications   Medication Sig Start Date End Date Taking? Authorizing Provider  aspirin 325 MG tablet Take 325 mg by mouth daily.   Yes Historical Provider, MD  cetirizine (ZYRTEC) 10 MG tablet Take 10 mg by mouth daily.   Yes Historical Provider, MD  ibuprofen (ADVIL,MOTRIN) 800 MG tablet Take 800 mg by mouth every 6 (six) hours as needed.   Yes Historical Provider, MD  Multiple Vitamin (MULTIVITAMIN) tablet Take 1  tablet by mouth daily.   Yes Historical Provider, MD  omeprazole (PRILOSEC) 40 MG capsule Take 40 mg by mouth daily.   Yes Historical Provider, MD  venlafaxine XR (EFFEXOR-XR) 150 MG 24 hr capsule Take 1 capsule (150 mg total) by mouth daily with breakfast. PATIENT NEEDS DEPRESSION CHECK UP FOR ADDITIONAL REFILLS 10/30/14  Yes [name], MD  fluticasone (CUTIVATE) 0.05 % cream Apply topically 2 (two) times daily. Patient not taking: Reported on 12/01/2014 08/16/14   Raelyn Ensign, PA   Social History   Social History  . Marital Status: Married    Spouse Name: N/A  . Number of Children: N/A  . Years of Education: N/A   Occupational History  . Not on file.   Social History Main Topics  . Smoking status: Current Every Day Smoker -- 1.00 packs/day for 22 years      Types: Cigarettes  . Smokeless tobacco: Not on file  . Alcohol Use: No  . Drug Use: No  . Sexual Activity: Yes   Other Topics Concern  . Not on file   Social History Narrative     Review of Systems  Constitutional: Negative for fever and chills.  HENT: Positive for congestion, ear pain (bilateral fullness), postnasal drip, sinus pressure and sore throat.   Respiratory: Negative for cough.   Psychiatric/Behavioral: Positive for dysphoric mood (h/o). Negative for suicidal ideas.       Objective:   Physical Exam  Constitutional: She is oriented to person, place, and time. She appears well-developed and well-nourished. No distress.  HENT:  Head: Normocephalic and atraumatic.  Right Ear: Tympanic membrane and ear canal normal.  Left Ear: Tympanic membrane and ear canal normal.  Nose: Right sinus exhibits maxillary sinus tenderness and frontal sinus tenderness. Left sinus exhibits maxillary sinus tenderness and frontal sinus tenderness.  Mouth/Throat: Oropharynx is clear and moist. No oropharyngeal exudate or posterior oropharyngeal erythema.  TMs pearl gray   Eyes: Conjunctivae and EOM are normal.  Neck: Neck supple. No tracheal deviation present.  Cardiovascular: Normal rate.   Pulmonary/Chest: Effort normal. No respiratory distress.  Musculoskeletal: Normal range of motion.  Lymphadenopathy:  Slightly tender over lymph nodes but no palpated lymphadenopathy  Neurological: She is alert and oriented to person, place, and time.  Skin: Skin is warm and dry.  Psychiatric: She has a normal mood and affect. Her behavior is normal.  Nursing note and vitals reviewed.  Filed Vitals:   12/01/14 1828  BP: 120/78  Pulse: 105  Temp: 98.5 F (36.9 C)  TempSrc: Oral  Resp: 18  Height: 5' 4.25" (1.632 m)  Weight: 198 lb 8 oz (90.039 kg)  SpO2: 98%       Assessment & Plan:   Shelly [name] is a 39 y.o. female Allergic rhinitis, unspecified allergic rhinitis type, Sinus  pressure   suspected allergic cause. No signs of sinus infection at this time. Discussed appropriate use of Flonase,  Saline nasal spray,  But if symptoms are not improving into next week, can call me and I can call in an antibiotic for possible secondary sinus infection.  RTC precautions.  Depression, other migraine without status migrainosus, not intractable   Stable on current dose of Effexor. Refilled through next year, but can follow-up after the school year to decide if we want to try to wean back to a lower dose. Discussed that with this medication would want to wean slowly due to serotonin withdrawal and side effects.  Meds ordered this encounter  Medications  . omeprazole (PRILOSEC) 40 MG capsule    Sig: Take 40 mg by mouth daily.  Marland Kitchen venlafaxine XR (EFFEXOR-XR) 150 MG 24 hr capsule    Sig: Take 1 capsule (150 mg total) by mouth daily with breakfast.    Dispense:  90 capsule    Refill:  3   Patient Instructions  Restart flonase, Saline nasal spray at least 4 times per day for allergies. If discolored nasal discharge and sinus pressure does not improve into Monday of next week - call me and I will call you in an antibiotic. Return to the clinic or go to the nearest emergency room if any of your symptoms worsen or new symptoms occur.  Ok to remain on same dose of Effexor for now. Do not miss dose, and would discuss plan prior to decreasing dose.  Follow up with any new provider here for physical in next 6 months.     I personally performed the services described in this documentation, which was scribed in my presence. The recorded information has been reviewed and considered, and addended by me as needed.

## 2015-02-22 ENCOUNTER — Ambulatory Visit (INDEPENDENT_AMBULATORY_CARE_PROVIDER_SITE_OTHER): Payer: BC Managed Care – PPO | Admitting: Physician Assistant

## 2015-02-22 ENCOUNTER — Ambulatory Visit (INDEPENDENT_AMBULATORY_CARE_PROVIDER_SITE_OTHER): Payer: BC Managed Care – PPO

## 2015-02-22 VITALS — BP 116/76 | HR 138 | Temp 100.0°F | Resp 18 | Ht 64.75 in | Wt 199.6 lb

## 2015-02-22 DIAGNOSIS — R058 Other specified cough: Secondary | ICD-10-CM

## 2015-02-22 DIAGNOSIS — R05 Cough: Secondary | ICD-10-CM | POA: Diagnosis not present

## 2015-02-22 DIAGNOSIS — R0602 Shortness of breath: Secondary | ICD-10-CM

## 2015-02-22 DIAGNOSIS — J189 Pneumonia, unspecified organism: Secondary | ICD-10-CM

## 2015-02-22 DIAGNOSIS — Z3202 Encounter for pregnancy test, result negative: Secondary | ICD-10-CM | POA: Diagnosis not present

## 2015-02-22 LAB — POCT CBC
Granulocyte percent: 78.2 %G (ref 37–80)
HCT, POC: 41.2 % (ref 37.7–47.9)
Hemoglobin: 13.8 g/dL (ref 12.2–16.2)
Lymph, poc: 3.1 (ref 0.6–3.4)
MCH: 28.9 pg (ref 27–31.2)
MCHC: 33.6 g/dL (ref 31.8–35.4)
MCV: 86 fL (ref 80–97)
MID (cbc): 0.8 (ref 0–0.9)
MPV: 8.4 fL (ref 0–99.8)
PLATELET COUNT, POC: 285 10*3/uL (ref 142–424)
POC Granulocyte: 14 — AB (ref 2–6.9)
POC LYMPH PERCENT: 17.3 %L (ref 10–50)
POC MID %: 4.5 %M (ref 0–12)
RBC: 4.79 M/uL (ref 4.04–5.48)
RDW, POC: 13.2 %
WBC: 17.9 10*3/uL — AB (ref 4.6–10.2)

## 2015-02-22 LAB — POCT URINE PREGNANCY: Preg Test, Ur: NEGATIVE

## 2015-02-22 MED ORDER — BENZONATATE 100 MG PO CAPS: 100.0000 mg | ORAL_CAPSULE | Freq: Three times a day (TID) | ORAL | Status: DC | PRN

## 2015-02-22 MED ORDER — GUAIFENESIN ER 1200 MG PO TB12: 1.0000 | ORAL_TABLET | Freq: Two times a day (BID) | ORAL | Status: DC | PRN

## 2015-02-22 MED ORDER — LEVOFLOXACIN 750 MG PO TABS: 750.0000 mg | ORAL_TABLET | Freq: Every day | ORAL | Status: AC

## 2015-02-22 MED ORDER — HYDROCOD POLST-CPM POLST ER 10-8 MG/5ML PO SUER: 5.0000 mL | Freq: Every evening | ORAL | Status: DC | PRN

## 2015-02-22 NOTE — Patient Instructions (Addendum)
Please make sure that you are hydrating well with 64oz of water per day or more.   Get plenty of rest. Community-Acquired Pneumonia, Adult Pneumonia is an infection of the lungs. There are different types of pneumonia. One type can develop while a person is in a hospital. A different type, called community-acquired pneumonia, develops in people who are not, or have not recently been, in the hospital or other health care facility.  CAUSES Pneumonia may be caused by bacteria, viruses, or funguses. Community-acquired pneumonia is often caused by Streptococcus pneumonia bacteria. These bacteria are often passed from one person to another by breathing in droplets from the cough or sneeze of an infected person. RISK FACTORS The condition is more likely to develop in:  People who havechronic diseases, such as chronic obstructive pulmonary disease (COPD), asthma, congestive heart failure, cystic fibrosis, diabetes, or kidney disease.  People who haveearly-stage or late-stage HIV.  People who havesickle cell disease.  People who havehad their spleen removed (splenectomy).  People who havepoor Administratordental hygiene.  People who havemedical conditions that increase the risk of breathing in (aspirating) secretions their own mouth and nose.   People who havea weakened immune system (immunocompromised).  People who smoke.  People whotravel to areas where pneumonia-causing germs commonly exist.  People whoare around animal habitats or animals that have pneumonia-causing germs, including birds, bats, rabbits, cats, and farm animals. SYMPTOMS Symptoms of this condition include:  Adry cough.  A wet (productive) cough.  Fever.  Sweating.  Chest pain, especially when breathing deeply or coughing.  Rapid breathing or difficulty breathing.  Shortness of breath.  Shaking chills.  Fatigue.  Muscle aches. DIAGNOSIS Your health care provider will take a medical history and perform a  physical exam. You may also have other tests, including:  Imaging studies of your chest, including X-rays.  Tests to check your blood oxygen level and other blood gases.  Other tests on blood, mucus (sputum), fluid around your lungs (pleural fluid), and urine. If your pneumonia is severe, other tests may be done to identify the specific cause of your illness. TREATMENT The type of treatment that you receive depends on many factors, such as the cause of your pneumonia, the medicines you take, and other medical conditions that you have. For most adults, treatment and recovery from pneumonia may occur at home. In some cases, treatment must happen in a hospital. Treatment may include:  Antibiotic medicines, if the pneumonia was caused by bacteria.  Antiviral medicines, if the pneumonia was caused by a virus.  Medicines that are given by mouth or through an IV tube.  Oxygen.  Respiratory therapy. Although rare, treating severe pneumonia may include:  Mechanical ventilation. This is done if you are not breathing well on your own and you cannot maintain a safe blood oxygen level.  Thoracentesis. This procedureremoves fluid around one lung or both lungs to help you breathe better. HOME CARE INSTRUCTIONS  Take over-the-counter and prescription medicines only as told by your health care provider.  Only takecough medicine if you are losing sleep. Understand that cough medicine can prevent your body's natural ability to remove mucus from your lungs.  If you were prescribed an antibiotic medicine, take it as told by your health care provider. Do not stop taking the antibiotic even if you start to feel better.  Sleep in a semi-upright position at night. Try sleeping in a reclining chair, or place a few pillows under your head.  Do not use tobacco products, including cigarettes,  chewing tobacco, and e-cigarettes. If you need help quitting, ask your health care provider.  Drink enough water to  keep your urine clear or pale yellow. This will help to thin out mucus secretions in your lungs. PREVENTION There are ways that you can decrease your risk of developing community-acquired pneumonia. Consider getting a pneumococcal vaccine if:  You are older than 41 years of age.  You are older than 41 years of age and are undergoing cancer treatment, have chronic lung disease, or have other medical conditions that affect your immune system. Ask your health care provider if this applies to you. There are different types and schedules of pneumococcal vaccines. Ask your health care provider which vaccination option is best for you. You may also prevent community-acquired pneumonia if you take these actions:  Get an influenza vaccine every year. Ask your health care provider which type of influenza vaccine is best for you.  Go to the dentist on a regular basis.  Wash your hands often. Use hand sanitizer if soap and water are not available. SEEK MEDICAL CARE IF:  You have a fever.  You are losing sleep because you cannot control your cough with cough medicine. SEEK IMMEDIATE MEDICAL CARE IF:  You have worsening shortness of breath.  You have increased chest pain.  Your sickness becomes worse, especially if you are an older adult or have a weakened immune system.  You cough up blood.   This information is not intended to replace advice given to you by your health care provider. Make sure you discuss any questions you have with your health care provider.   Document Released: 04/03/2005 Document Revised: 12/23/2014 Document Reviewed: 07/29/2014 Elsevier Interactive Patient Education [employer].

## 2015-02-22 NOTE — Progress Notes (Signed)
[facility] [address] [place] [address] [phone]  Date:  02/22/2015   Name:  Shelly [name]   DOB:  October 11, 1975   MRN:  [id number]  PCP:  Tally Due, MD    History of Present Illness:  Shelly [name] is a 39 y.o. female patient who presents to [facility] for cc of cough, laryngitis, sore throat, chills, and sob since yesterday. Patient reports that she had a mild dry cough 2 days ago, that progressively worsened today.  She has fatigue,  fever and chills.  It has become productive of a thick yellow sputum.  She also has ear pain and hip pain at this time.  She had chills, lightheadedness, and pain in her hips.  She had also noticed some shortness of breath when she was moving around too much.  She took her inhaler which helped resolve that symptoms.  She has felt drained.  Ears hurt and throat pain.   She has not had anything for relief.  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       Patient Active Problem List   Diagnosis Date Noted  . SARCOIDOSIS 04/05/2007  . RHINITIS, ACUTE 04/05/2007  . TRACHEOBRONCHITIS 04/05/2007  . ARTHRITIS 04/05/2007  . DYSPNEA 04/05/2007    Past Medical History  Diagnosis Date  . Allergy   . Sarcoidosis (HCC)   . Migraines   . Depression   . Anxiety   . PFO (patent foramen ovale)   . TIA (transient ischemic attack)   . Arthritis   . Anemia   . GERD (gastroesophageal reflux disease)   . Stroke (HCC)   . Miscarriage     Past Surgical History  Procedure Laterality Date  . Cholecystectomy    . Nasal sinus surgery    . Hand surgery    . Bronchoscopy    . Cesarean section      miscarriage  . Miscarriage  08/10/14    Social History  Substance Use Topics  . Smoking status: Current Every Day Smoker -- 1.00 packs/day for 22 years    Types: Cigarettes  . Smokeless tobacco: None  . Alcohol Use: No    Family History  Problem Relation Age of Onset  . Osteoporosis Mother   . Asthma  Mother   . Diabetes Father   . Heart disease Father   . Kidney disease Maternal Grandmother   . Heart disease Maternal Grandfather   . Cancer Paternal Grandmother   . Heart disease Paternal Grandfather     Allergies  Allergen Reactions  . Clindamycin/Lincomycin   . Keflex [Cephalexin]   . Penicillins   . Sulfonamide Derivatives     Medication list has been reviewed and updated.  Current Outpatient Prescriptions on File Prior to Visit  Medication Sig Dispense Refill  . aspirin 325 MG tablet Take 325 mg by mouth daily.    . cetirizine (ZYRTEC) 10 MG tablet Take 10 mg by mouth daily.    . Multiple Vitamin (MULTIVITAMIN) tablet Take 1 tablet by mouth daily.    Marland Kitchen omeprazole (PRILOSEC) 40 MG capsule Take 40 mg by mouth daily.    Marland Kitchen venlafaxine XR (EFFEXOR-XR) 150 MG 24 hr capsule Take 1 capsule (150 mg total) by mouth daily with breakfast. 90 capsule 3  . fluticasone (CUTIVATE) 0.05 % cream Apply topically 2 (two) times daily. (Patient not taking: Reported on 12/01/2014) 30 g 1  . ibuprofen (ADVIL,MOTRIN) 800 MG tablet Take 800 mg  by mouth every 6 (six) hours as needed.     No current facility-administered medications on file prior to visit.    ROS ROS otherwise unremarkable unless listed above.  Physical Examination: BP 116/76 mmHg  Pulse 138  Temp(Src) 100 F (37.8 C) (Oral)  Resp 18  Ht 5' 4.75" (1.645 m)  Wt 199 lb 9.6 oz (90.538 kg)  BMI 33.46 kg/m2  SpO2 98%  LMP 01/28/2015 Ideal Body Weight: Weight in (lb) to have BMI = 25: 148.8  Physical Exam  Constitutional: She is oriented to person, place, and time. She appears well-developed and well-nourished.  HENT:  Right Ear: Tympanic membrane, external ear and ear canal normal.  Left Ear: Tympanic membrane, external ear and ear canal normal.  Nose: Mucosal edema and rhinorrhea present. Right sinus exhibits no maxillary sinus tenderness and no frontal sinus tenderness. Left sinus exhibits no maxillary sinus tenderness and  no frontal sinus tenderness.  Mouth/Throat: No oropharyngeal exudate, posterior oropharyngeal edema or posterior oropharyngeal erythema.  Cardiovascular: Regular rhythm.  Tachycardia present.  Exam reveals no gallop, no distant heart sounds and no friction rub.   No murmur heard. Pulses:      Radial pulses are 2+ on the right side, and 2+ on the left side.  Pulmonary/Chest: No apnea. No respiratory distress. She has no decreased breath sounds. She has no wheezes. She has no rhonchi.  Lymphadenopathy:       Head (right side): No submental, no submandibular, no tonsillar, no preauricular and no posterior auricular adenopathy present.       Head (left side): No submental, no submandibular, no tonsillar, no preauricular and no posterior auricular adenopathy present.    She has no cervical adenopathy.    She has no axillary adenopathy.       Right: No supraclavicular adenopathy present.       Left: No supraclavicular adenopathy present.  Neurological: She is alert and oriented to person, place, and time.  Skin: Skin is warm and dry.  Psychiatric: She has a normal mood and affect. Her behavior is normal.    Results for orders placed or performed in visit on 02/22/15  POCT CBC  Result Value Ref Range   WBC 17.9 (A) 4.6 - 10.2 K/uL   Lymph, poc 3.1 0.6 - 3.4   POC LYMPH PERCENT 17.3 10 - 50 %L   MID (cbc) 0.8 0 - 0.9   POC MID % 4.5 0 - 12 %M   POC Granulocyte 14.0 (A) 2 - 6.9   Granulocyte percent 78.2 37 - 80 %G   RBC 4.79 4.04 - 5.48 M/uL   Hemoglobin 13.8 12.2 - 16.2 g/dL   HCT, POC 40.941.2 81.137.7 - 47.9 %   MCV 86.0 80 - 97 fL   MCH, POC 28.9 27 - 31.2 pg   MCHC 33.6 31.8 - 35.4 g/dL   RDW, POC 91.413.2 %   Platelet Count, POC 285 142 - 424 K/uL   MPV 8.4 0 - 99.8 fL  POCT urine pregnancy  Result Value Ref Range   Preg Test, Ur Negative Negative   [facility] reading (PRIMARY) by  Dr. Clelia CroftShaw: Right medial lower lobe infiltrate vs lymph node.     Assessment and Plan: [name] is a 39  y.o. female who is here today with cough, myalgias, and sob for 2 days.   -Temperature echecked in office at 99.9 -Advised hydration, and rest at this time. -rtc in 48 hours if she does not have  CAP (community acquired pneumonia) - Plan: levofloxacin (LEVAQUIN) 750 MG tablet, chlorpheniramine-HYDROcodone (TUSSIONEX PENNKINETIC ER) 10-8 MG/5ML SUER, Guaifenesin (MUCINEX MAXIMUM STRENGTH) 1200 MG TB12, benzonatate (TESSALON) 100 MG capsule  Productive cough - Plan: POCT CBC, DG Chest 2 View, POCT urine pregnancy, chlorpheniramine-HYDROcodone (TUSSIONEX PENNKINETIC ER) 10-8 MG/5ML SUER, benzonatate (TESSALON) 100 MG capsule  SOB (shortness of breath) - Plan: POCT CBC, DG Chest 2 View, POCT urine pregnancy  Trena Platt, PA-C [facility] 02/22/2015 8:07 PM

## 2015-02-23 ENCOUNTER — Ambulatory Visit (INDEPENDENT_AMBULATORY_CARE_PROVIDER_SITE_OTHER): Payer: BC Managed Care – PPO | Admitting: Internal Medicine

## 2015-02-23 VITALS — BP 122/80 | HR 141 | Temp 101.9°F | Resp 18 | Ht 64.75 in | Wt 200.8 lb

## 2015-02-23 DIAGNOSIS — J189 Pneumonia, unspecified organism: Secondary | ICD-10-CM | POA: Diagnosis not present

## 2015-02-23 DIAGNOSIS — J9801 Acute bronchospasm: Secondary | ICD-10-CM

## 2015-02-23 MED ORDER — PREDNISONE 20 MG PO TABS: ORAL_TABLET | ORAL | Status: DC

## 2015-02-23 MED ORDER — ALBUTEROL SULFATE (2.5 MG/3ML) 0.083% IN NEBU
2.5000 mg | INHALATION_SOLUTION | Freq: Once | RESPIRATORY_TRACT | Status: AC
  Administered 2015-02-23: 2.5 mg via RESPIRATORY_TRACT

## 2015-02-23 MED ORDER — IPRATROPIUM BROMIDE 0.02 % IN SOLN
0.5000 mg | Freq: Once | RESPIRATORY_TRACT | Status: AC
  Administered 2015-02-23: 0.5 mg via RESPIRATORY_TRACT

## 2015-02-23 NOTE — Progress Notes (Signed)
Subjective:    Patient ID: Shelly [name], female    DOB: 06/02/1975, 39 y.o.   MRN: [id number] This chart was scribed for Ellamae Siaobert Cassiel Fernandez, MD by Jolene Provostobert Halas, Medical Scribe. This patient was seen in Room 3 and the patient's care was started a 8:02 PM.  Chief Complaint  Patient presents with  . Follow-up    pneumonia x 1 week  . Sinusitis  . Sore Throat  . Generalized Body Aches  . Fever    HPI HPI Comments: Shelly [name] is a 39 y.o. female with a past hx of sarcoidosis who [facility] of productive cough, sore throat, myalgias, and fever for the last two days with associated SOB. [facility] and was diagnosed with pneumonia. She was put on Levaquin 750mg  and cough medications .She states she has slept all day today. She continues with productive cough. Her cough hurts in the anterior chest. She has mild shortness of breath at times. [facility] because when her fever got to 102 this afternoon, she called [facility] and staff told her she should come in and be seen. She has been drinking water all day and has not vomited. No antipyretics today.  She was diagnosed with asthma last February. She has an albuterol inhaler at home but she only uses it for emergencies.   She also states she is having pain in her right hip that reminds her of the pain she had while she was suffering from sarcoidosis. Sarcoidosis has been in remission.  Patient Active Problem List   Diagnosis Date Noted  . 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 FO (foramen ovale) 09/09/2014  . SARCOIDOSIS 04/05/2007  . RHINITIS, ACUTE 04/05/2007  . TRACHEOBRONCHITIS 04/05/2007  . ARTHRITIS 04/05/2007  . DYSPNEA 04/05/2007    Past Medical History  Diagnosis Date  . Allergy   . Sarcoidosis (HCC)   . Migraines   . Depression   . Anxiety   . PFO (patent foramen ovale)   . TIA (transient  ischemic attack)   . Arthritis   . Anemia   . GERD (gastroesophageal reflux disease)   . Stroke (HCC)   . Miscarriage    Allergies  Allergen Reactions  . Clindamycin/Lincomycin   . Keflex [Cephalexin]   . Penicillins   . Sulfonamide Derivatives    Current Outpatient Prescriptions on File Prior to Visit  Medication Sig Dispense Refill  . benzonatate (TESSALON) 100 MG capsule Take 1-2 capsules (100-200 mg total) by mouth 3 (three) times daily as needed for cough. 40 capsule 0  . cetirizine (ZYRTEC) 10 MG tablet Take 10 mg by mouth daily.    . chlorpheniramine-HYDROcodone (TUSSIONEX PENNKINETIC ER) 10-8 MG/5ML SUER Take 5 mLs by mouth at bedtime as needed for cough. 80 mL 0  . Guaifenesin (MUCINEX MAXIMUM STRENGTH) 1200 MG TB12 Take 1 tablet (1,200 mg total) by mouth every 12 (twelve) hours as needed. 14 tablet 1  . levofloxacin (LEVAQUIN) 750 MG tablet Take 1 tablet (750 mg total) by mouth daily. 5 tablet 0  . Multiple Vitamin (MULTIVITAMIN) tablet Take 1 tablet by mouth daily.    Marland Kitchen. omeprazole (PRILOSEC) 40 MG capsule Take 40 mg by mouth daily.    Marland Kitchen. venlafaxine XR (EFFEXOR-XR) 150 MG 24 hr capsule Take 1 capsule (150 mg total) by mouth daily with breakfast. 90 capsule 3  . aspirin 325 MG tablet Take 325  mg by mouth daily.    Marland Kitchen ibuprofen (ADVIL,MOTRIN) 800 MG tablet Take 800 mg by mouth every 6 (six) hours as needed.     No current facility-administered medications on file prior to visit.    Review of Systems  Constitutional: Positive for fever, chills and diaphoresis.  HENT: Positive for congestion and sore throat. Negative for rhinorrhea and sinus pressure.   Eyes: Negative for photophobia.  Respiratory: Positive for cough, chest tightness and shortness of breath.   Gastrointestinal: Negative for abdominal pain and diarrhea.  Genitourinary: Negative for dysuria, frequency and difficulty urinating.  Musculoskeletal: Positive for myalgias.      Objective:  BP 122/80 mmHg  Pulse  141  Temp(Src) 101.9 F (38.8 C) (Oral)  Resp 18  Ht 5' 4.75" (1.645 m)  Wt 200 lb 12.8 oz (91.082 kg)  BMI 33.66 kg/m2  SpO2 97%  LMP 01/28/2015  Physical Exam  Constitutional: She is oriented to person, place, and time. She appears well-developed and well-nourished. No distress.  HENT:  Right Ear: External ear normal.  Left Ear: External ear normal.  Nose: Nose normal.  Mouth/Throat: Oropharynx is clear and moist.  Eyes: Conjunctivae and EOM are normal. Pupils are equal, round, and reactive to light.  Neck: Normal range of motion. Neck supple.  Cardiovascular: Regular rhythm and intact distal pulses.  Exam reveals no friction rub.   No murmur heard. Tachycardic, but regular. No m or rub.  Pulmonary/Chest: She has wheezes. She has rales. She exhibits no tenderness.  Rales in the right axilla and wheezing bilaterally with forced expiration.  Abdominal: She exhibits no distension. There is no tenderness.  Musculoskeletal: She exhibits tenderness. She exhibits no edema.  Tender over R hiplaterally without redness or swelling ROM ok  Lymphadenopathy:    She has no cervical adenopathy.  Neurological: She is alert and oriented to person, place, and time.  Skin: Skin is warm.  Psychiatric: She has a normal mood and affect. Her behavior is normal.   Neb with albut and atrovent produced subjective and objective improvement Xray fr yest=RML infiltr    Assessment & Plan:  CAP (community acquired pneumonia) -contin levaquin -bedrest -Antipyretics -push fluids -f/u 24hr if worse, 2 d if not afebrile -repeat CXR 1 month  Bronchospasm  - Plan:pred 6d - Use albut q 4-6h -f/u 2 w to see in prolonged inhaled steroids needed  Nicotine addiction -obviously needs to quit  R Hip discomfort-?etio  Hx Sarcoid  Meds ordered this encounter  Medications  . albuterol (PROVENTIL) (2.5 MG/3ML) 0.083% nebulizer solution 2.5 mg    Sig:   . ipratropium (ATROVENT) nebulizer solution 0.5  mg    Sig:   . predniSONE (DELTASONE) 20 MG tablet    Sig: 3/3/2/2/1/1 single daily dose for 6 days    Dispense:  12 tablet    Refill:  0      I have completed the patient encounter in its entirety as documented by the scribe, with editing by me where necessary. Saphire Barnhart P. Merla Riches, M.D.   By signing my name below, I, Javier Docker, attest that this documentation has been prepared under the direction and in the presence of Ellamae Sia, MD. Electronically Signed: Javier Docker, ER Scribe. 02/23/2015. 8:03 PM.

## 2015-03-02 ENCOUNTER — Ambulatory Visit (INDEPENDENT_AMBULATORY_CARE_PROVIDER_SITE_OTHER): Payer: BC Managed Care – PPO

## 2015-03-02 ENCOUNTER — Ambulatory Visit (INDEPENDENT_AMBULATORY_CARE_PROVIDER_SITE_OTHER): Payer: BC Managed Care – PPO | Admitting: Physician Assistant

## 2015-03-02 VITALS — BP 110/78 | HR 100 | Temp 98.5°F | Resp 18 | Ht 65.0 in | Wt 200.4 lb

## 2015-03-02 DIAGNOSIS — J069 Acute upper respiratory infection, unspecified: Secondary | ICD-10-CM

## 2015-03-02 DIAGNOSIS — Z8701 Personal history of pneumonia (recurrent): Secondary | ICD-10-CM

## 2015-03-02 DIAGNOSIS — Z720 Tobacco use: Secondary | ICD-10-CM

## 2015-03-02 DIAGNOSIS — R062 Wheezing: Secondary | ICD-10-CM

## 2015-03-02 DIAGNOSIS — Z716 Tobacco abuse counseling: Secondary | ICD-10-CM

## 2015-03-02 MED ORDER — HYDROCOD POLST-CPM POLST ER 10-8 MG/5ML PO SUER: 5.0000 mL | Freq: Every evening | ORAL | Status: DC | PRN

## 2015-03-02 MED ORDER — IPRATROPIUM BROMIDE 0.03 % NA SOLN: 2.0000 | Freq: Two times a day (BID) | NASAL | Status: DC

## 2015-03-02 NOTE — Progress Notes (Signed)
Subjective:    Patient ID: Shelly [name], female    DOB: 19-Apr-1975, 39 y.o.   MRN: [id number]  HPI Patient presents for pneumonia follow up. Finally returned to work today and begun having increased wheezing and chest tightness. States that wheezing and chest tightness had resolved entirely 2 days ago and cough has improved, but worse at night. States that discomfort in chest is sometimes burning is sensation. Additionally endorses HA, some SOB, rhinorrhea, and congestion. Completed levaquin, tussionex, and prednisone. Has used albuterol here and there.   When questioned about stress is tearful and states that work does not present increased stress load. Works at a [employer] and enjoys job. Tried to quit smoking earlier this year, however, found out she was pregnant so stopped taking Chantix. Would like to try chantix again at some point. Realized that her delivery date would have been coming up soon. Adds that this has just been a rough week.    Review of Systems As noted above.    Objective:   Physical Exam  Constitutional: She is oriented to person, place, and time. She appears well-developed and well-nourished. No distress.  Blood pressure 110/78, pulse 100, temperature 98.5 F (36.9 C), temperature source Oral, resp. rate 18, height  (1.651 m), weight 200 lb 6.4 oz (90.901 kg), last menstrual period 02/14/2015, SpO2 96 %.  HENT:  Head: Normocephalic and atraumatic.  Right Ear: Tympanic membrane, external ear and ear canal normal.  Left Ear: Tympanic membrane, external ear and ear canal normal.  Nose: Rhinorrhea present. No mucosal edema. Right sinus exhibits no maxillary sinus tenderness. Left sinus exhibits no maxillary sinus tenderness and no frontal sinus tenderness.  Mouth/Throat: Uvula is midline and oropharynx is clear and moist. No oropharyngeal exudate.  Eyes: Conjunctivae are normal. Pupils are equal, round, and reactive to light. Right eye exhibits no discharge. Left  eye exhibits no discharge. No scleral icterus.  Neck: Normal range of motion. Neck supple.  Cardiovascular: Normal rate, regular rhythm, normal heart sounds and intact distal pulses.  Exam reveals no gallop and no friction rub.   No murmur heard. Pulmonary/Chest: Effort normal and breath sounds normal. No accessory muscle usage. No apnea. No respiratory distress. She has no decreased breath sounds. She has no wheezes. She has no rhonchi. She has no rales. She exhibits tenderness.  Lymphadenopathy:    She has no cervical adenopathy.  Neurological: She is alert and oriented to person, place, and time.  Skin: She is not diaphoretic.  Psychiatric: She has a normal mood and affect. Her behavior is normal. Judgment and thought content normal.    [facility] reading (PRIMARY) by  Dr. Alwyn Ren. Lower left lobe infiltrate present, but improved from 02/22/15.     Assessment & Plan:  1. URI (upper respiratory infection) Continue tessalon and scheduled albuterol. Chest tightness more likely related to stress and anxiety versus previous problems with pneumonia. - chlorpheniramine-HYDROcodone (TUSSIONEX PENNKINETIC ER) 10-8 MG/5ML SUER; Take 5 mLs by mouth at bedtime as needed for cough.  Dispense: 75 mL; Refill: 0 - ipratropium (ATROVENT) 0.03 % nasal spray; Place 2 sprays into both nostrils 2 (two) times daily.  Dispense: 30 mL; Refill: 0  2. History of pneumonia F/U CXR in one month.  - DG Chest 2 View; Future  3. Wheezing - DG Chest 2 View; Future  4. Encounter for smoking cessation counseling Will call back when ready to re-start Chantix.   Janan Ridge PA-C  [facility] 03/02/2015 3:16 PM

## 2015-03-25 ENCOUNTER — Ambulatory Visit (INDEPENDENT_AMBULATORY_CARE_PROVIDER_SITE_OTHER): Payer: BC Managed Care – PPO | Admitting: Family Medicine

## 2015-03-25 VITALS — BP 130/78 | HR 98 | Temp 99.0°F | Resp 16 | Ht 64.0 in | Wt 202.4 lb

## 2015-03-25 DIAGNOSIS — S83421A Sprain of lateral collateral ligament of right knee, initial encounter: Secondary | ICD-10-CM | POA: Diagnosis not present

## 2015-03-25 MED ORDER — PREDNISONE 20 MG PO TABS: ORAL_TABLET | ORAL | Status: DC

## 2015-03-25 MED ORDER — HYDROCODONE-ACETAMINOPHEN 5-325 MG PO TABS: 1.0000 | ORAL_TABLET | Freq: Four times a day (QID) | ORAL | Status: DC | PRN

## 2015-03-25 NOTE — Patient Instructions (Signed)
Lateral Collateral Knee Ligament Sprain With Phase I Rehab The lateral collateral ligament (LCL) of the knee helps hold the knee joint in proper alignment and prevents the bones from shifting out of alignment (displacing) toward the outside (laterally). Injury to the knee may cause a tear in the LCL ligament (sprain). The LCL is the least common ligament of the knee to be injured. Sprains may heal on their own, but they often result in a loose joint. Sprains are classified into three categories. Grade 1 sprains cause pain, but the tendon is not lengthened. Grade 2 sprains include a lengthened ligament, due to the ligament being stretched or partially ruptured. With grade 2 sprains there is still function, although the function may be decreased. Grade 3 sprains involve a complete tear of the tendon or muscle, and function is usually impaired. SYMPTOMS   Pain and tenderness on the outer side of the knee.  A "pop," tearing, or pulling sensation at the time of injury.  Bruising (contusion) at the site of injury within 48 hours of injury.  Knee stiffness.  Limping, often walking with the knee bent. CAUSES  An LCL sprain occurs when a force is placed on the ligament that is greater than it can handle. Common causes of injury include:  Direct hit (trauma) to the inner side of the knee, especially if the foot is planted on the ground.  Forceful pivoting of the body and leg while the foot is planted on the ground. RISK INCREASES WITH:  Contact sports (football, rugby).  Sports that require pivoting or cutting (soccer).  Poor knee strength and flexibility.  Improper equipment use. PREVENTION   Warm up and stretch properly before activity.  Maintain physical fitness:  Strength, flexibility, and endurance.  Cardiovascular fitness.  Wear properly fitted protective equipment (correct length of cleats for surface).  Functional braces may be effective in preventing injury. PROGNOSIS  If  treated properly, LCL tears usually heal on their own. Sometimes, surgery is required. RELATED COMPLICATIONS   Frequently recurring symptoms, such as knee giving way, instability, and swelling.  Injury to other structures in the knee joint.  Meniscal cartilage, resulting in locking and swelling of the knee.  Articular cartilage, resulting in knee arthritis.  Other ligaments of the knee (commonly).  Injury to nerves, causing numbness of the outer leg, foot, and ankle and weakness or paralysis, with inability to raise the ankle, big toe, or lesser toes.  Knee stiffness (loss of knee motion). TREATMENT  Treatment first involves the use of ice and medicine to reduce pain and inflammation. The use of strengthening and stretching exercises may help reduce pain with activity. These exercises may be performed at home, but referral to a therapist is often advised. You may be advised to walk with crutches until you are able to walk without a limp. Your caregiver may provide you with a hinged knee brace to help regain a full range of motion while also protecting the injured knee. For severe LCL injuries, or injuries that involve other ligaments of the knee, surgery is often advised. MEDICATION   If pain medicine is needed, nonsteroidal anti-inflammatory medicines (aspirin and ibuprofen), or other minor pain relievers (acetaminophen), are often advised.  Do not take pain medicine for 7 days before surgery.  Prescription pain relievers may be given, if your caregiver thinks they are needed. Use only as directed and only as much as you need. HEAT AND COLD  Cold treatment (icing) should be applied for 10 to 15 minutes   every 2 to 3 hours for inflammation and pain, and immediately after activity that aggravates your symptoms. Use ice packs or an ice massage.  Heat treatment may be used before performing stretching and strengthening activities prescribed by your caregiver, physical therapist, or athletic  trainer. Use a heat pack or a warm water soak. SEEK MEDICAL CARE IF:   Symptoms get worse or do not improve in 4 to 6 weeks, despite treatment.  New, unexplained symptoms develop. (Drugs used in treatment may produce side effects.) EXERCISES RANGE OF MOTION (ROM) AND STRETCHING EXERCISES - Lateral Collateral Knee Ligament Sprain Phase I These are some of the initial exercises that your physician, physical therapist or athletic trainer may have you perform to begin your rehabilitation. When you demonstrate gains in your flexibility and strength, your caregiver may progress you to Phase II exercises. As you perform these exercises, remember:   These initial exercises are intended to be gentle. They will help you restore motion without increasing any swelling.  Completing these exercises allows less painful movement and prepares you for the more aggressive strengthening exercises in Phase II.  An effective stretch should be held for at least 30 seconds.  A stretch should never be painful. You should only feel a gentle lengthening or release in the stretched tissue. RANGE OF MOTION - Knee Flexion, Active  Lie on your back with both knees straight. (If this causes back discomfort, bend your opposite knee, placing your foot flat on the floor.)  Slowly slide your heel back toward your buttocks until you feel a gentle stretch in the front of your knee or thigh.  Hold for __________ seconds. Slowly slide your heel back to the starting position. Repeat __________ times. Complete this exercise __________ times per day.  STRETCH - Knee Flexion, Supine  Lie on the floor with your right / left heel and foot lightly touching the wall. (Place both feet on the wall, if you do not use a door frame.)  Without using any effort, allow gravity to slide your foot down the wall slowly until you feel a gentle stretch in the front of your right / left knee.  Hold this stretch for __________ seconds. Then return  the leg to the starting position, using your healthy leg for help, if needed. Repeat __________ times. Complete this stretch __________ times per day.  RANGE OF MOTION - Knee Flexion and Extension, Active-Assisted  Sit on the edge of a table or chair with your thighs firmly supported. It may be helpful to place a folded towel under the end of your right / left thigh.  Flexion (bending): Place the ankle of your healthy leg on top of the other ankle. Use your healthy leg to gently bend your right / left knee until you feel a mild tension across the top of your knee.  Hold for __________ seconds.  Extension (straightening): Switch your ankles so your right / left leg is on top. Use your healthy leg to straighten your right / left knee until you feel a mild tension on the backside of your knee.  Hold for __________ seconds. Repeat __________ times. Complete this exercise __________ times per day. STRETCH - Knee Extension Sitting  Sit with your right / left leg/heel propped on another chair, coffee table, or foot stool.  Allow your leg muscles to relax, letting gravity straighten out your knee.*  You should feel a stretch behind your right / left knee. Hold this position for __________ seconds. Repeat __________ times.   Complete this stretch __________ times per day.  *Your physician, physical therapist, or athletic trainer may instruct you place a __________ weight on your thigh, just above your kneecap, to deepen the stretch.  STRENGTHENING EXERCISES Lateral Collateral Knee Ligament Sprain - Phase I These exercises may help you when beginning to rehabilitate your injury. They may resolve your symptoms with or without further involvement from your physician, physical therapist, or athletic trainer. While completing these exercises, remember:   Muscles can gain both the endurance and the strength needed for everyday activities through controlled exercises.  Complete these exercises as  instructed by your physician, physical therapist or athletic trainer. Increase the resistance and repetitions only as guided.  In order to return to more demanding activities, you will likely need to progress to more challenging exercises. Your physician, physical therapist or athletic trainer will advance your exercises when your tissues show adequate healing and your muscles demonstrate increased strength. STRENGTH - Quadriceps, Isometrics  Lie on your back with your right / left leg extended and your opposite knee bent.  Gradually tense the muscles in the front of your right / left thigh. You should see either your kneecap slide up toward your hip or increased dimpling just above the knee. This motion will push the back of the knee down toward the floor, mat, or bed on which you are lying.  Hold the muscle as tight as you can without increasing your pain for __________ seconds.  Relax the muscles slowly and completely between each repetition. Repeat __________ times. Complete this exercise __________ times per day.  STRENGTH - Quadriceps, Short Arcs   Lie on your back. Place a __________ inch towel roll under your right / left knee, so that the knee bends slightly.  Raise only your lower leg by tightening the muscles in the front of your thigh. Do not allow your thigh to rise.  Hold this position for __________ seconds. Repeat __________ times. Complete this exercise __________ times per day.  OPTIONAL ANKLE WEIGHTS: Begin with ____________________, but DO NOT exceed ____________________. Increase in 1 pound/0.5 kilogram increments. STRENGTH - Quadriceps, Straight Leg Raises  Quality counts! Watch for signs that the quadriceps muscle is working, to be sure you are strengthening the correct muscles and not "cheating" by substituting with healthier muscles.  Lie on your back with your right / left leg extended and your opposite knee bent.  Tense the muscles in the front of your right /  left thigh. You should see either your kneecap slide up or increased dimpling just above the knee. Your thigh may even shake a bit.  Tighten these muscles even more and raise your leg 4 to 6 inches off the floor. Hold for __________ seconds.  Keeping these muscles tense, lower your leg.  Relax the muscles slowly and completely in between each repetition. Repeat __________ times. Complete this exercise __________ times per day.  STRENGTH - Hamstring, Isometrics   Lie on your back, on a firm surface.  Bend your right / left knee approximately __________ degrees.  Dig your heel into the surface as if you are trying to pull it toward your buttocks. Tighten the muscles in the back of your thighs to "dig" as hard as you can, without increasing any pain.  Hold this position for __________ seconds.  Release the tension gradually and allow your muscle to completely relax for __________ seconds in between each exercise. Repeat __________ times. Complete this exercise __________ times per day.  STRENGTH - Hamstring,   Curls   Lie on your stomach with your legs extended. (If you lie on a bed, your feet may hang over the edge.)  Tighten the muscles in the back of your thigh to bend your right / left knee up to 90 degrees. Keep your hips flat on the bed.  Hold this position for __________ seconds.  Slowly lower your leg back to the starting position. Repeat __________ times. Complete this exercise __________ times per day.  OPTIONAL ANKLE WEIGHTS: Begin with ____________________, but DO NOT exceed ____________________. Increase in 1 pound/0.5 kilogram increments.   This information is not intended to replace advice given to you by your health care provider. Make sure you discuss any questions you have with your health care provider.   Document Released: 04/03/2005 Document Revised: 04/24/2014 Document Reviewed: 07/16/2008 Elsevier Interactive Patient Education 2016 [employer]  

## 2015-03-25 NOTE — Progress Notes (Signed)
   Subjective:    Patient ID: Shelly [name], female    DOB: 04-Aug-1975, 39 y.o.   MRN: [id number] By signing my name below, I, Littie Deedsichard Sun, attest that this documentation has been prepared under the direction and in the presence of Elvina SidleKurt Valyncia Wiens, MD.  Electronically Signed: Littie Deedsichard Sun, Medical Scribe. 03/25/2015. 6:42 PM.  HPI HPI Comments: Shelly Crosbymanda S Vacca is a 39 y.o. female who presents to the [facility] complaining of sudden onset right knee pain radiating down through her leg to the toes that started after she stood up around lunchtime today. She reports having associated knee swelling. The pain has been improving. The pain is worse with ambulation. She states that earlier today, straightening her right leg would cause the pain to radiate down the back of her leg. Patient denies injury or fall. She reports having allergies to clindamycin, keflex, penicillin, and sulfa. 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  Patient is a Runner, broadcasting/film/videoteacher.  Review of Systems  Musculoskeletal: Positive for joint swelling and arthralgias.       Objective:   Physical Exam CONSTITUTIONAL: Well developed/well nourished HEAD: Normocephalic/atraumatic EYES: EOM/PERRL ENMT: Mucous membranes moist NECK: supple no meningeal signs SPINE: entire spine nontender CV: S1/S2 noted, no murmurs/rubs/gallops noted LUNGS: Lungs are clear to auscultation bilaterally, no apparent distress ABDOMEN: soft, nontender, no rebound or guarding GU: no cva tenderness NEURO: Pt is awake/alert, moves all extremitiesx4 EXTREMITIES: Tenderness to the LCL of the right knee.  No effusion or bony abnormality. Ligaments intact and there is no pain with ligaments are stressed SKIN: warm, color normal PSYCH: no abnormalities of mood noted      Assessment & Plan:   This chart was scribed in my presence and reviewed by me personally.    ICD-9-CM  ICD-10-CM   1. Sprain and strain of lateral collateral ligament of knee, right, initial encounter 844.0 S83.421A predniSONE (DELTASONE) 20 MG tablet     HYDROcodone-acetaminophen (NORCO) 5-325 MG tablet     Signed, Elvina SidleKurt Harrel Ferrone, MD

## 2015-09-07 ENCOUNTER — Ambulatory Visit (INDEPENDENT_AMBULATORY_CARE_PROVIDER_SITE_OTHER): Payer: BC Managed Care – PPO | Admitting: Physician Assistant

## 2015-09-07 VITALS — BP 130/82 | HR 118 | Temp 98.7°F | Resp 18 | Ht 64.0 in | Wt 205.0 lb

## 2015-09-07 DIAGNOSIS — F40243 Fear of flying: Secondary | ICD-10-CM | POA: Diagnosis not present

## 2015-09-07 DIAGNOSIS — J069 Acute upper respiratory infection, unspecified: Secondary | ICD-10-CM | POA: Diagnosis not present

## 2015-09-07 DIAGNOSIS — B9789 Other viral agents as the cause of diseases classified elsewhere: Secondary | ICD-10-CM

## 2015-09-07 MED ORDER — GUAIFENESIN ER 1200 MG PO TB12: 1.0000 | ORAL_TABLET | Freq: Two times a day (BID) | ORAL | Status: AC

## 2015-09-07 MED ORDER — HYDROCOD POLST-CPM POLST ER 10-8 MG/5ML PO SUER: 5.0000 mL | Freq: Two times a day (BID) | ORAL | Status: DC | PRN

## 2015-09-07 MED ORDER — DOXYCYCLINE HYCLATE 100 MG PO TABS: 100.0000 mg | ORAL_TABLET | Freq: Two times a day (BID) | ORAL | Status: DC

## 2015-09-07 MED ORDER — ALPRAZOLAM 0.5 MG PO TABS: 0.2500 mg | ORAL_TABLET | Freq: Two times a day (BID) | ORAL | Status: DC | PRN

## 2015-09-07 NOTE — Patient Instructions (Addendum)
Please push fluids.  Tylenol and Motrin for fever and body aches.    A humidifier can help especially when the air is dry -if you do not have a humidifier you can boil a pot of water on the stove in your home to help with the dry air.   For the flight start with a half of pill and you can take up to 2 for each flight    IF you received an x-ray today, you will receive an invoice from [facility]. Please contact [facility] at [phone] with questions or concerns regarding your invoice.   IF you received labwork today, you will receive an invoice from [facility]. Please contact [facility] at [phone] with questions or concerns regarding your invoice.   Our billing staff will not be able to assist you with questions regarding bills from these companies.  You will be contacted with the lab results as soon as they are available. The fastest way to get your results is to activate your My Chart account. Instructions are located on the last page of this paperwork. If you have not heard from us regarding the results in 2 weeks, please contact this office.

## 2015-09-07 NOTE — Progress Notes (Signed)
Shelly [name]  MRN: [id number] DOB: [birth year]  Subjective:  Pt presents to clinic with 5 day h/o cold symptoms.  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  She has never flown before and she is scared.  She is going with other. She has taken a Xanax in the past for an MRI which helped with his anxiety but did not make her sleepy.  Patient Active Problem List   Diagnosis Date Noted  . 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 FO (foramen ovale) 09/09/2014  . SARCOIDOSIS 04/05/2007  . RHINITIS, ACUTE 04/05/2007  . TRACHEOBRONCHITIS 04/05/2007  . ARTHRITIS 04/05/2007  . DYSPNEA 04/05/2007    Current Outpatient Prescriptions on File Prior to Visit  Medication Sig Dispense Refill  . aspirin 325 MG tablet Take 325 mg by mouth daily.    . cetirizine (ZYRTEC) 10 MG tablet Take 10 mg by mouth daily.    Marland Kitchen ipratropium (ATROVENT) 0.03 % nasal spray Place 2 sprays into both nostrils 2 (two) times daily. 30 mL 0  . Multiple Vitamin (MULTIVITAMIN) tablet Take 1 tablet by mouth daily.    Marland Kitchen omeprazole (PRILOSEC) 40 MG capsule Take 40 mg by mouth daily.     No current facility-administered medications on file prior to visit.    Allergies  Allergen Reactions  . Clindamycin/Lincomycin   . Keflex [Cephalexin]   . Penicillins   . Sulfonamide Derivatives     Review of Systems  Constitutional: Positive for fever (99-100 yesterday) and chills.    HENT: Positive for congestion, postnasal drip, rhinorrhea and sore throat.   Respiratory: Positive for cough. Negative for shortness of breath and wheezing.   Gastrointestinal: Positive for nausea.  Musculoskeletal: Negative for myalgias.  Neurological: Positive for headaches.   Objective:  BP 130/82 mmHg  Pulse 118  Temp(Src) 98.7 F (37.1 C) (Oral)  Resp 18  Ht  (1.626 m)  Wt 205 lb (92.987 kg)  BMI 35.17 kg/m2  SpO2 96%  LMP 08/31/2015  Physical Exam  Constitutional: She is oriented to person, place, and time and well-developed, well-nourished, and in no distress.  HENT:  Head: Normocephalic and atraumatic.  Right Ear: Hearing, tympanic membrane, external ear and ear canal normal.  Left Ear: Hearing, tympanic membrane, external ear and ear canal normal.  Nose: Nose normal.  Mouth/Throat: Uvula is midline, oropharynx is clear and moist and mucous membranes are normal.  Eyes: Conjunctivae are normal.  Neck: Normal range of motion.  Cardiovascular: Normal rate, regular rhythm and normal heart sounds.   No murmur heard. Pulmonary/Chest: Effort normal and breath sounds normal. She has no wheezes.  Neurological: She is alert and oriented to person, place, and time. Gait normal.  Skin: Skin is warm and dry.  Psychiatric: Mood, memory, affect and judgment normal.  Vitals reviewed.   Assessment and Plan :  Fear of flying - Plan: ALPRAZolam Prudy Feeler)  0.5 MG tablet - d/w pt how and when to take in regards to the flight  Viral URI with cough - Plan: Guaifenesin (MUCINEX MAXIMUM STRENGTH) 1200 MG TB12, chlorpheniramine-HYDROcodone (TUSSIONEX PENNKINETIC ER) 10-8 MG/5ML SUER, doxycycline (VIBRA-TABS) 100 MG tablet, Care order/instruction - we will start with symptomatic treatment - she will start her atrovent nasal spray again - due to her sarcoid I have given her a Rx for an abx that she will fill if she is not improving in the next several days but at this point I suspect this is  a virus.  She will rest and hydrate.  Benny LennertSarah Weber PA-C  [facility] 09/07/2015 9:15 AM

## 2015-11-23 ENCOUNTER — Ambulatory Visit (INDEPENDENT_AMBULATORY_CARE_PROVIDER_SITE_OTHER): Payer: BC Managed Care – PPO | Admitting: Urgent Care

## 2015-11-23 ENCOUNTER — Other Ambulatory Visit: Payer: Self-pay | Admitting: Family Medicine

## 2015-11-23 VITALS — BP 122/74 | HR 126 | Temp 99.3°F | Resp 17 | Ht 64.5 in | Wt 204.0 lb

## 2015-11-23 DIAGNOSIS — Z862 Personal history of diseases of the blood and blood-forming organs and certain disorders involving the immune mechanism: Secondary | ICD-10-CM

## 2015-11-23 DIAGNOSIS — R05 Cough: Secondary | ICD-10-CM

## 2015-11-23 DIAGNOSIS — R040 Epistaxis: Secondary | ICD-10-CM | POA: Diagnosis not present

## 2015-11-23 DIAGNOSIS — J019 Acute sinusitis, unspecified: Secondary | ICD-10-CM | POA: Diagnosis not present

## 2015-11-23 DIAGNOSIS — F17200 Nicotine dependence, unspecified, uncomplicated: Secondary | ICD-10-CM

## 2015-11-23 DIAGNOSIS — F32A Depression, unspecified: Secondary | ICD-10-CM

## 2015-11-23 DIAGNOSIS — F329 Major depressive disorder, single episode, unspecified: Secondary | ICD-10-CM

## 2015-11-23 DIAGNOSIS — J3489 Other specified disorders of nose and nasal sinuses: Secondary | ICD-10-CM

## 2015-11-23 DIAGNOSIS — R059 Cough, unspecified: Secondary | ICD-10-CM

## 2015-11-23 MED ORDER — VENLAFAXINE HCL ER 150 MG PO CP24: 150.0000 mg | ORAL_CAPSULE | Freq: Every day | ORAL | 1 refills | Status: DC

## 2015-11-23 MED ORDER — VENLAFAXINE HCL 75 MG PO TABS: 75.0000 mg | ORAL_TABLET | Freq: Two times a day (BID) | ORAL | 1 refills | Status: DC

## 2015-11-23 MED ORDER — DOXYCYCLINE HYCLATE 100 MG PO CAPS: 100.0000 mg | ORAL_CAPSULE | Freq: Two times a day (BID) | ORAL | 0 refills | Status: DC

## 2015-11-23 NOTE — Progress Notes (Signed)
    MRN: [id number] DOB: 05/20/1975  Subjective:   Shelly [name] is a 40 y.o. female presenting for chief complaint of Medication Refill (effexor) and Epistaxis  Depression - Managed well with Effexor. Has taken this for 14 years. Her gynecologist added 75mg  in addition to her standard dose of 150mg  XR ~6 months ago. Feels like she does not want to come back down yet. 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 Denies SI, HI. Patient also has the stressor of cancer with her father.  Epistaxis - Reports 2 week history of nasal congestion. Patient blew out her nose which had large clot of blood mixed with mucus. Also had some right neck lymph node pain, frontal sinus pain. She has since had intermittent nose bleeds ~3 episodes. Uses Zyrtec daily for allergies. Denies fever, ear pain, ear drainage, sore throat. Smokes ~1 ppd day. Has tried Chantix in the past but had to stop this because she became pregnant more than 1 year ago.  Shelly [name] has a current medication list which includes the following prescription(s): alprazolam, aspirin, cetirizine, multivitamin, omeprazole, venlafaxine, and venlafaxine xr. Also is allergic to clindamycin/lincomycin; keflex [cephalexin]; penicillins; and sulfonamide derivatives.  Shelly [name]  has a past medical history of Allergy; Anemia; Anxiety; Arthritis; Depression; GERD (gastroesophageal reflux disease); Migraines; Miscarriage; PFO (patent foramen ovale); Sarcoidosis (HCC); Stroke [facility](HCC); and TIA (transient ischemic attack). Also  has a past surgical history that includes Cholecystectomy; Nasal sinus surgery; Hand surgery; Bronchoscopy; Cesarean section; and miscarriage (08/10/14).  Objective:   Vitals: BP 122/74 (BP Location: Right Arm, Patient Position: Sitting, Cuff Size: Normal)   Pulse (!) 126   Temp 99.3 F (37.4 C) (Oral)   Resp 17   Ht 5' 4.5" (1.638 m)   Wt 204 lb (92.5 kg)   LMP 10/26/2015 (Approximate)   SpO2 98%   BMI 34.48 kg/m   Physical Exam    Constitutional: She is oriented to person, place, and time. She appears well-developed and well-nourished.  HENT:  TM's intact bilaterally, no effusions or erythema. Nasal turbinates erythematous with thick yellow mucus. Bilateral maxiallry sinus tenderness. Oropharynx clear, mucous membranes moist, dentition in good repair.  Eyes: Right eye exhibits no discharge. Left eye exhibits no discharge. No scleral icterus.  Cardiovascular: Normal rate, regular rhythm and intact distal pulses.  Exam reveals no gallop and no friction rub.   No murmur heard. Pulmonary/Chest: No respiratory distress. She has no wheezes. She has no rales.  Neurological: She is alert and oriented to person, place, and time.  Skin: Skin is warm and dry.   Assessment and Plan :   1. Depression - Refill for 6 months. Recheck at that point. She will let me know if she wants to try to come back down to just 150mg  Effexor XR.  2. Tobacco use disorder - Patient will let me know if she wants to restart Chantix.  3. Cough 4. History of sarcoidosis - Monitor  5. Acute sinusitis, recurrence not specified, unspecified location 6. Sinus pain - Will cover for sinusitis with doxycycline given allergies to PCN. RTC in 1 week if no improvement. Continue Zyrtec daily. Consider Singulair for better allergy control.  7. Epistaxis - Will monitor, consider referral to ENT if nosebleeds persist. For now use nasal saline rinses.  Wallis BambergMario Aarianna Hoadley, PA-C [facility] [phone] 11/23/2015 2:19 PM

## 2015-11-23 NOTE — Patient Instructions (Addendum)
Sinusitis, Adult Sinusitis is redness, soreness, and inflammation of the paranasal sinuses. Paranasal sinuses are air pockets within the bones of your face. They are located beneath your eyes, in the middle of your forehead, and above your eyes. In healthy paranasal sinuses, mucus is able to drain out, and air is able to circulate through them by way of your nose. However, when your paranasal sinuses are inflamed, mucus and air can become trapped. This can allow bacteria and other germs to grow and cause infection. Sinusitis can develop quickly and last only a short time (acute) or continue over a long period (chronic). Sinusitis that lasts for more than 12 weeks is considered chronic. CAUSES Causes of sinusitis include:  Allergies.  Structural abnormalities, such as displacement of the cartilage that separates your nostrils (deviated septum), which can decrease the air flow through your nose and sinuses and affect sinus drainage.  Functional abnormalities, such as when the small hairs (cilia) that line your sinuses and help remove mucus do not work properly or are not present. SIGNS AND SYMPTOMS Symptoms of acute and chronic sinusitis are the same. The primary symptoms are pain and pressure around the affected sinuses. Other symptoms include:  Upper toothache.  Earache.  Headache.  Bad breath.  Decreased sense of smell and taste.  A cough, which worsens when you are lying flat.  Fatigue.  Fever.  Thick drainage from your nose, which often is green and may contain pus (purulent).  Swelling and warmth over the affected sinuses. DIAGNOSIS Your health care provider will perform a physical exam. During your exam, your health care provider may perform any of the following to help determine if you have acute sinusitis or chronic sinusitis:  Look in your nose for signs of abnormal growths in your nostrils (nasal polyps).  Tap over the affected sinus to check for signs of  infection.  View the inside of your sinuses using an imaging device that has a light attached (endoscope). If your health care provider suspects that you have chronic sinusitis, one or more of the following tests may be recommended:  Allergy tests.  Nasal culture. A sample of mucus is taken from your nose, sent to a lab, and screened for bacteria.  Nasal cytology. A sample of mucus is taken from your nose and examined by your health care provider to determine if your sinusitis is related to an allergy. TREATMENT Most cases of acute sinusitis are related to a viral infection and will resolve on their own within 10 days. Sometimes, medicines are prescribed to help relieve symptoms of both acute and chronic sinusitis. These may include pain medicines, decongestants, nasal steroid sprays, or saline sprays. However, for sinusitis related to a bacterial infection, your health care provider will prescribe antibiotic medicines. These are medicines that will help kill the bacteria causing the infection. Rarely, sinusitis is caused by a fungal infection. In these cases, your health care provider will prescribe antifungal medicine. For some cases of chronic sinusitis, surgery is needed. Generally, these are cases in which sinusitis recurs more than 3 times per year, despite other treatments. HOME CARE INSTRUCTIONS  Drink plenty of water. Water helps thin the mucus so your sinuses can drain more easily.  Use a humidifier.  Inhale steam 3-4 times a day (for example, sit in the bathroom with the shower running).  Apply a warm, moist washcloth to your face 3-4 times a day, or as directed by your health care provider.  Use saline nasal sprays to help   moisten and clean your sinuses.  Take medicines only as directed by your health care provider.  If you were prescribed either an antibiotic or antifungal medicine, finish it all even if you start to feel better. SEEK IMMEDIATE MEDICAL CARE IF:  You have  increasing pain or severe headaches.  You have nausea, vomiting, or drowsiness.  You have swelling around your face.  You have vision problems.  You have a stiff neck.  You have difficulty breathing.   This information is not intended to replace advice given to you by your health care provider. Make sure you discuss any questions you have with your health care provider.   Document Released: 04/03/2005 Document Revised: 04/24/2014 Document Reviewed: 04/18/2011 Elsevier Interactive Patient Education 2016 [employer]    Smoking Cessation, Tips for Success If you are ready to quit smoking, congratulations! You have chosen to help yourself be healthier. Cigarettes bring nicotine, tar, carbon monoxide, and other irritants into your body. Your lungs, heart, and blood vessels will be able to work better without these poisons. There are many different ways to quit smoking. Nicotine gum, nicotine patches, a nicotine inhaler, or nicotine nasal spray can help with physical craving. Hypnosis, support groups, and medicines help break the habit of smoking. WHAT THINGS CAN I DO TO MAKE QUITTING EASIER?  Here are some tips to help you quit for good:  Pick a date when you will quit smoking completely. Tell all of your friends and family about your plan to quit on that date.  Do not try to slowly cut down on the number of cigarettes you are smoking. Pick a quit date and quit smoking completely starting on that day.  Throw away all cigarettes.   Clean and remove all ashtrays from your home, work, and car.  On a card, write down your reasons for quitting. Carry the card with you and read it when you get the urge to smoke.  Cleanse your body of nicotine. Drink enough water and fluids to keep your urine clear or pale yellow. Do this after quitting to flush the nicotine from your body.  Learn to predict your moods. Do not let a bad situation be your excuse to have a cigarette. Some situations in your  life might tempt you into wanting a cigarette.  Never have "just one" cigarette. It leads to wanting another and another. Remind yourself of your decision to quit.  Change habits associated with smoking. If you smoked while driving or when feeling stressed, try other activities to replace smoking. Stand up when drinking your coffee. Brush your teeth after eating. Sit in a different chair when you read the paper. Avoid alcohol while trying to quit, and try to drink fewer caffeinated beverages. Alcohol and caffeine may urge you to smoke.  Avoid foods and drinks that can trigger a desire to smoke, such as sugary or spicy foods and alcohol.  Ask people who smoke not to smoke around you.  Have something planned to do right after eating or having a cup of coffee. For example, plan to take a walk or exercise.  Try a relaxation exercise to calm you down and decrease your stress. Remember, you may be tense and nervous for the first 2 weeks after you quit, but this will pass.  Find new activities to keep your hands busy. Play with a pen, coin, or rubber band. Doodle or draw things on paper.  Brush your teeth right after eating. This will help cut down on the  craving for the taste of tobacco after meals. You can also try mouthwash.   Use oral substitutes in place of cigarettes. Try using lemon drops, carrots, cinnamon sticks, or chewing gum. Keep them handy so they are available when you have the urge to smoke.  When you have the urge to smoke, try deep breathing.  Designate your home as a nonsmoking area.  If you are a heavy smoker, ask your health care provider about a prescription for nicotine chewing gum. It can ease your withdrawal from nicotine.  Reward yourself. Set aside the cigarette money you save and buy yourself something nice.  Look for support from others. Join a support group or smoking cessation program. Ask someone at home or at work to help you with your plan to quit  smoking.  Always ask yourself, "Do I need this cigarette or is this just a reflex?" Tell yourself, "Today, I choose not to smoke," or "I do not want to smoke." You are reminding yourself of your decision to quit.  Do not replace cigarette smoking with electronic cigarettes (commonly called e-cigarettes). The safety of e-cigarettes is unknown, and some may contain harmful chemicals.  If you relapse, do not give up! Plan ahead and think about what you will do the next time you get the urge to smoke. HOW WILL I FEEL WHEN I QUIT SMOKING? You may have symptoms of withdrawal because your body is used to nicotine (the addictive substance in cigarettes). You may crave cigarettes, be irritable, feel very hungry, cough often, get headaches, or have difficulty concentrating. The withdrawal symptoms are only temporary. They are strongest when you first quit but will go away within 10-14 days. When withdrawal symptoms occur, stay in control. Think about your reasons for quitting. Remind yourself that these are signs that your body is healing and getting used to being without cigarettes. Remember that withdrawal symptoms are easier to treat than the major diseases that smoking can cause.  Even after the withdrawal is over, expect periodic urges to smoke. However, these cravings are generally short lived and will go away whether you smoke or not. Do not smoke! WHAT RESOURCES ARE AVAILABLE TO HELP ME QUIT SMOKING? Your health care provider can direct you to community resources or hospitals for support, which may include:  Group support.  Education.  Hypnosis.  Therapy.   This information is not intended to replace advice given to you by your health care provider. Make sure you discuss any questions you have with your health care provider.   Document Released: 12/31/2003 Document Revised: 04/24/2014 Document Reviewed: 09/19/2012 Elsevier Interactive Patient Education 2016 [employer].     IF you  received an x-ray today, you will receive an invoice from [facility]. Please contact [facility] at [phone] with questions or concerns regarding your invoice.   IF you received labwork today, you will receive an invoice from [employer]. Please contact [facility] at [phone] with questions or concerns regarding your invoice.   Our billing staff will not be able to assist you with questions regarding bills from these companies.  You will be contacted with the lab results as soon as they are available. The fastest way to get your results is to activate your My Chart account. Instructions are located on the last page of this paperwork. If you have not heard from [place] regarding the results in 2 weeks, please contact this office.

## 2016-01-20 ENCOUNTER — Ambulatory Visit (INDEPENDENT_AMBULATORY_CARE_PROVIDER_SITE_OTHER): Payer: BC Managed Care – PPO | Admitting: Physician Assistant

## 2016-01-20 VITALS — BP 124/80 | HR 89 | Temp 98.9°F | Resp 18 | Ht 64.5 in | Wt 204.0 lb

## 2016-01-20 DIAGNOSIS — J040 Acute laryngitis: Secondary | ICD-10-CM

## 2016-01-20 DIAGNOSIS — R0982 Postnasal drip: Secondary | ICD-10-CM

## 2016-01-20 DIAGNOSIS — J302 Other seasonal allergic rhinitis: Secondary | ICD-10-CM | POA: Diagnosis not present

## 2016-01-20 MED ORDER — FLUTICASONE PROPIONATE 50 MCG/ACT NA SUSP: 2.0000 | Freq: Every day | NASAL | 12 refills | Status: DC

## 2016-01-20 NOTE — Progress Notes (Signed)
Shelly [name]  MRN: [id number] DOB: [birth year]  PCP: Tally DueGUEST, CHRIS WARREN, MD  Subjective:  Pt is a 40 year old female who presents to clinic for illness x two days. She feels "gunky" yesterday. She felt fine this morning, however developed a "raw throat" and hoarse voice as the day progressed. Also notes dizziness and occasional "viscious sneezes"  Denies cough, SOB, Cp, fever, chills.  Notes seasonal allergies, develops sinus problems when the seasons change.  Has not tried anything to aleve her symptoms.  Sick contact: her 42 year old son is sick.   Review of Systems  Constitutional: Negative for chills, diaphoresis, fatigue and fever.  HENT: Positive for congestion, postnasal drip, sinus pressure, sneezing, sore throat and voice change. Negative for tinnitus and trouble swallowing.   Respiratory: Negative for cough, chest tightness, shortness of breath and wheezing.   Cardiovascular: Negative.   Neurological: Negative for weakness, light-headedness and headaches.    Patient Active Problem List   Diagnosis Date Noted  . 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 FO (foramen ovale) 09/09/2014  . SARCOIDOSIS 04/05/2007  . RHINITIS, ACUTE 04/05/2007  . TRACHEOBRONCHITIS 04/05/2007  . ARTHRITIS 04/05/2007  . DYSPNEA 04/05/2007    Current Outpatient Prescriptions on File Prior to Visit  Medication Sig Dispense Refill  . cetirizine (ZYRTEC) 10 MG tablet Take 10 mg by mouth daily.    . Multiple Vitamin (MULTIVITAMIN) tablet Take 1 tablet by mouth daily.    Marland Kitchen. venlafaxine (EFFEXOR) 75 MG tablet Take 1 tablet (75 mg total) by mouth 2 (two) times daily. 90 tablet 1  . venlafaxine XR (EFFEXOR-XR) 150 MG 24 hr capsule Take 1 capsule (150 mg total) by mouth daily with breakfast. 90 capsule 1  . ALPRAZolam (XANAX) 0.5 MG tablet Take 0.5-2 tablets (0.25-1 mg total) by mouth 2 (two) times daily as needed for anxiety.  (Patient not taking: Reported on 01/20/2016) 10 tablet 0  . aspirin 325 MG tablet Take 325 mg by mouth daily.     No current facility-administered medications on file prior to visit.     Allergies  Allergen Reactions  . Clindamycin/Lincomycin   . Keflex [Cephalexin]   . Penicillins   . Sulfonamide Derivatives     Objective:  BP 124/80 (BP Location: Right Arm, Patient Position: Sitting, Cuff Size: Small)   Pulse 89   Temp 98.9 F (37.2 C) (Oral)   Resp 18   Ht 5' 4.5" (1.638 m)   Wt 204 lb (92.5 kg)   LMP 01/14/2016   SpO2 98%   BMI 34.48 kg/m   Physical Exam  Constitutional: She is oriented to person, place, and time and well-developed, well-nourished, and in no distress. No distress.  HENT:  Right Ear: Tympanic membrane normal.  Left Ear: Tympanic membrane normal.  Mouth/Throat: Uvula is midline and mucous membranes are normal. Oropharyngeal exudate and tonsillar abscesses present. No posterior oropharyngeal edema or posterior oropharyngeal erythema.  Cardiovascular: Normal rate, regular rhythm and normal heart sounds.   Pulmonary/Chest: Effort normal and breath sounds normal. She has no wheezes.  Neurological: She is alert and oriented to person, place, and time. GCS score is 15.  Skin: Skin is warm and dry.  Psychiatric: Mood, memory, affect and judgment normal.  Vitals reviewed.   Assessment and Plan :  1. Laryngitis 2. Chronic seasonal allergic rhinitis, unspecified trigger 3. Post-nasal drip - fluticasone (FLONASE) 50 MCG/ACT nasal spray; Place 2 sprays into both nostrils  daily.  Dispense: 16 g; Refill: 12 - Supportive care: Encouraged patient to stay well hydrated, use humidifier, and rest vocal cords. RTC is symptoms fail to improve/worsen.    Marco Collie, PA-C  [facility] 01/20/2016 3:12 PM

## 2016-01-20 NOTE — Patient Instructions (Addendum)
Thank you for coming in today. I hope you feel we met your needs.  Feel free to call [facility] if you have any questions or further requests.  Please consider signing up for MyChart if you do not already have it, as this is a great way to communicate with me.  Best,  Whitney McVey, PA-C    IF you received an x-ray today, you will receive an invoice from [facility]. Please contact [facility] at [phone] with questions or concerns regarding your invoice.   IF you received labwork today, you will receive an invoice from [employer]. Please contact [facility] at [phone] with questions or concerns regarding your invoice.   Our billing staff will not be able to assist you with questions regarding bills from these companies.  You will be contacted with the lab results as soon as they are available. The fastest way to get your results is to activate your My Chart account. Instructions are located on the last page of this paperwork. If you have not heard from [place] regarding the results in 2 weeks, please contact this office.     Laryngitis Laryngitis is inflammation of your vocal cords. This causes hoarseness, coughing, loss of voice, sore throat, or a dry throat. Your vocal cords are two bands of muscles that are found in your throat. When you speak, these cords come together and vibrate. These vibrations come out through your mouth as sound. When your vocal cords are inflamed, your voice sounds different. Laryngitis can be temporary (acute) or long-term (chronic). Most cases of acute laryngitis improve with time. Chronic laryngitis is laryngitis that lasts for more than three weeks. CAUSES Acute laryngitis may be caused by:  A viral infection.  Lots of talking, yelling, or singing. This is also called vocal strain.  Bacterial infections. Chronic laryngitis may be caused by:  Vocal strain.  Injury to your vocal cords.  Acid reflux  (gastroesophageal reflux disease or GERD).  Allergies.  Sinus infection.  Smoking.  Alcohol abuse.  Breathing in chemicals or dust.  Growths on the vocal cords. RISK FACTORS Risk factors for laryngitis include:  Smoking.  Alcohol abuse.  Having allergies. SIGNS AND SYMPTOMS Symptoms of laryngitis may include:  Low, hoarse voice.  Loss of voice.  Dry cough.  Sore throat.  Stuffy nose. DIAGNOSIS Laryngitis may be diagnosed by:  Physical exam.  Throat culture.  Blood test.  Laryngoscopy. This procedure allows your health care provider to look at your vocal cords with a mirror or viewing tube. TREATMENT Treatment for laryngitis depends on what is causing it. Usually, treatment involves resting your voice and using medicines to soothe your throat. However, if your laryngitis is caused by a bacterial infection, you may need to take antibiotic medicine. If your laryngitis is caused by a growth, you may need to have a procedure to remove it. HOME CARE INSTRUCTIONS  Drink enough fluid to keep your urine clear or pale yellow.  Breathe in moist air. Use a humidifier if you live in a dry climate.  Take medicines only as directed by your health care provider.  If you were prescribed an antibiotic medicine, finish it all even if you start to feel better.  Do not smoke cigarettes or electronic cigarettes. If you need help quitting, ask your health care provider.  Talk as little as possible. Also avoid whispering, which can cause vocal strain.  Write instead of talking. Do this until your voice is back to normal. [facility]  CARE IF:  You have a fever.  You have increasing pain.  You have difficulty swallowing. SEEK IMMEDIATE MEDICAL CARE IF:  You cough up blood.  You have trouble breathing.   This information is not intended to replace advice given to you by your health care provider. Make sure you discuss any questions you have with your health care  provider.   Document Released: 04/03/2005 Document Revised: 04/24/2014 Document Reviewed: 09/16/2013 Elsevier Interactive Patient Education [employer].

## 2016-03-20 ENCOUNTER — Ambulatory Visit (INDEPENDENT_AMBULATORY_CARE_PROVIDER_SITE_OTHER): Payer: BC Managed Care – PPO | Admitting: Physician Assistant

## 2016-03-20 VITALS — BP 130/84 | HR 102 | Temp 98.9°F | Resp 17 | Ht 65.0 in | Wt 204.0 lb

## 2016-03-20 DIAGNOSIS — J019 Acute sinusitis, unspecified: Secondary | ICD-10-CM

## 2016-03-20 MED ORDER — BENZONATATE 100 MG PO CAPS: 100.0000 mg | ORAL_CAPSULE | Freq: Three times a day (TID) | ORAL | 0 refills | Status: DC | PRN

## 2016-03-20 MED ORDER — GUAIFENESIN ER 1200 MG PO TB12: 1.0000 | ORAL_TABLET | Freq: Two times a day (BID) | ORAL | 1 refills | Status: DC | PRN

## 2016-03-20 MED ORDER — DOXYCYCLINE HYCLATE 100 MG PO CAPS: 100.0000 mg | ORAL_CAPSULE | Freq: Two times a day (BID) | ORAL | 0 refills | Status: AC

## 2016-03-20 NOTE — Progress Notes (Signed)
Patient ID: Shelly [name], female    DOB: [birth year], 40 y.o.   MRN: [id number]  PCP: Tally DueGUEST, CHRIS WARREN, MD  Chief Complaint  Patient presents with  . URI  . Laryngitis    Subjective:   Presents for evaluation of uri and laryngitis.  "I started to feel icky, tight in my face," irritated throat 03/15/2016. OTC cold product intermittently, which seemed to help some. Awoke 12/02 with "horrible migraine, sore throat," took OTC meds and slept. Felt better that night. This morning feels chest tightness and burning sensation in the chest. SOB yesterday. Reduced smoking from 1/2 ppd to only 2 cigarettes yesterday.  Son (age 207) had sinus and cough (yellow gooey stuff). Resolved with OTC products. 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    Review of Systems  Constitutional: Positive for activity change and fatigue. Negative for chills and fever.  HENT: Positive for congestion, postnasal drip, rhinorrhea, sinus pain, sinus pressure, sore throat and voice change. Negative for trouble swallowing.   Eyes: Negative for visual disturbance.  Respiratory: Positive for cough, chest tightness and shortness of breath. Negative for apnea, choking and wheezing.   Cardiovascular: Negative for chest pain, palpitations and leg swelling.  Gastrointestinal: Negative for diarrhea, nausea and vomiting.  Genitourinary: Negative for dysuria, frequency, hematuria and urgency.  Musculoskeletal: Negative for arthralgias and myalgias.  Neurological: Negative for dizziness, light-headedness and headaches.       Patient Active Problem List   Diagnosis Date Noted  . 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 FO (foramen ovale) 09/09/2014  . Smoking addiction 06/05/2014  . Persistent asthma 06/05/2014  . GERD without esophagitis 06/05/2014  . SARCOIDOSIS 04/05/2007  . ARTHRITIS 04/05/2007      Prior to Admission medications   Medication Sig Start Date End Date Taking? Authorizing Provider  aspirin (GOODSENSE ASPIRIN) 325 MG tablet Take 325 mg by mouth.   Yes Historical Provider, MD  cetirizine (ZYRTEC) 10 MG tablet Take 10 mg by mouth daily.   Yes Historical Provider, MD  fluticasone (FLONASE) 50 MCG/ACT nasal spray Place 2 sprays into both nostrils daily. 01/20/16  Yes Elizabeth Whitney McVey, PA-C  Multiple Vitamin (MULTIVITAMIN) tablet Take 1 tablet by mouth daily.   Yes Historical Provider, MD  venlafaxine XR (EFFEXOR-XR) 150 MG 24 hr capsule Take 1 capsule (150 mg total) by mouth daily with breakfast. 11/23/15  Yes Wallis BambergMario Mani, PA-C  venlafaxine XR (EFFEXOR-XR) 75 MG 24 hr capsule Take 75 mg by mouth daily with breakfast. Take one daily, with 150 mg, to achieve 225 mg total daily dose.   Yes Historical Provider, MD  albuterol (PROVENTIL HFA;VENTOLIN HFA) 108 (90 Base) MCG/ACT inhaler Inhale into the lungs as needed. 06/05/14   Historical Provider, MD     Allergies  Allergen Reactions  . Clindamycin/Lincomycin   . Keflex [Cephalexin]   . Penicillins   . Sulfonamide Derivatives        Objective:  Physical Exam  Constitutional: She is oriented to person, place, and time. She appears well-developed and well-nourished. No distress.  BP 130/84 (BP Location: Right Arm, Patient Position: Sitting, Cuff Size: Normal)   Pulse (!) 102   Temp 98.9 F (37.2 C) (Oral)   Resp 17   Ht 5\' 5"  (1.651 m)   Wt 204 lb (92.5 kg)   LMP 03/06/2016 (Approximate)   SpO2 98%   BMI 33.95 kg/m    HENT:  Head:  Normocephalic and atraumatic.  Right Ear: Hearing, tympanic membrane, external ear and ear canal normal.  Left Ear: Hearing, tympanic membrane, external ear and ear canal normal.  Nose: Mucosal edema and rhinorrhea present.  No foreign bodies. Right sinus exhibits no maxillary sinus tenderness and no frontal sinus tenderness. Left sinus exhibits no maxillary sinus tenderness and no  frontal sinus tenderness.  Mouth/Throat: Uvula is midline, oropharynx is clear and moist and mucous membranes are normal. No uvula swelling. No oropharyngeal exudate.  Eyes: Conjunctivae and EOM are normal. Pupils are equal, round, and reactive to light. Right eye exhibits no discharge. Left eye exhibits no discharge. No scleral icterus.  Neck: Trachea normal, normal range of motion and full passive range of motion without pain. Neck supple. No thyroid mass and no thyromegaly present.  Cardiovascular: Normal rate, regular rhythm and normal heart sounds.   Pulmonary/Chest: Effort normal and breath sounds normal.  Lymphadenopathy:       Head (right side): No submandibular, no tonsillar, no preauricular, no posterior auricular and no occipital adenopathy present.       Head (left side): No submandibular, no tonsillar, no preauricular and no occipital adenopathy present.    She has no cervical adenopathy.       Right: No supraclavicular adenopathy present.       Left: No supraclavicular adenopathy present.  Neurological: She is alert and oriented to person, place, and time. She has normal strength. No cranial nerve deficit or sensory deficit.  Skin: Skin is warm, dry and intact. No rash noted.  Psychiatric: She has a normal mood and affect. Her speech is normal and behavior is normal.           Assessment & Plan:   1. Acute non-recurrent sinusitis, unspecified location As a smoker, she is at increased risk for bacterial cause of her symptoms. Encouraged smoking cessation. Cover with doxycycline, given her allergies to PCN, cephalosporins and macrolides. Rest. Hydrate. Supportive care. - doxycycline (VIBRAMYCIN) 100 MG capsule; Take 1 capsule (100 mg total) by mouth 2 (two) times daily.  Dispense: 20 capsule; Refill: 0 - benzonatate (TESSALON) 100 MG capsule; Take 1-2 capsules (100-200 mg total) by mouth 3 (three) times daily as needed for cough.  Dispense: 40 capsule; Refill: 0 - Guaifenesin  (MUCINEX MAXIMUM STRENGTH) 1200 MG TB12; Take 1 tablet (1,200 mg total) by mouth every 12 (twelve) hours as needed.  Dispense: 14 tablet; Refill: 1   Fernande Brashelle S. Camilah Spillman, PA-C Physician Assistant-Certified [facility]

## 2016-03-20 NOTE — Patient Instructions (Addendum)
Restart the Atrovent (ipratropium bromide) nasal spray. Use the albuterol inhaler. Get lots of rest and drink at least 64 ounces of water. Quit smoking (of course!)    IF you received an x-ray today, you will receive an invoice from [facility]. Please contact [facility] at [phone] with questions or concerns regarding your invoice.   IF you received labwork today, you will receive an invoice from [facility]. Please contact [facility] at [phone] with questions or concerns regarding your invoice.   Our billing staff will not be able to assist you with questions regarding bills from these companies.  You will be contacted with the lab results as soon as they are available. The fastest way to get your results is to activate your My Chart account. Instructions are located on the last page of this paperwork. If you have not heard from us regarding the results in 2 weeks, please contact this office.     Did you know that you begin to benefit from quitting smoking within the first twenty minutes? It's TRUE.  At 20 minutes: -blood pressure decreases -pulse rate drops -body temperature of hands and feet increases  At 8 hours: -carbon monoxide level in blood drops to normal -oxygen level in blood increases to normal  At 24 hours: -the chance of heart attack decreases  At 48 hours: -nerve endings start regrowing -ability to smell and taste is enhanced  2 weeks-3 months: -circulation improves -walking becomes easier -lung function improves  1-9 months: -coughing, sinus congestion, fatigue and shortness of breath decreases  1 year: -excess risk of heart disease is decreased to HALF that of a smoker  5 years: Stroke risk is reduced to that of people who have never smoked  10 years: -risk of lung cancer drops to as little as half that of continuing smokers -risk of cancer of the mouth, throat, esophagus, bladder, kidney and  pancreas decreases -risk of ulcer decreases  15 years -risk of heart disease is now similar to that of people who have never smoked -risk of death returns to nearly the level of people who have never smoked

## 2016-03-21 ENCOUNTER — Telehealth: Payer: Self-pay

## 2016-03-21 NOTE — Telephone Encounter (Signed)
Shelly [name]   Patient states she is no better and her lungs feel like they are  On fire.   [phone] (H)

## 2016-03-23 ENCOUNTER — Ambulatory Visit (INDEPENDENT_AMBULATORY_CARE_PROVIDER_SITE_OTHER): Payer: BC Managed Care – PPO | Admitting: Family Medicine

## 2016-03-23 ENCOUNTER — Ambulatory Visit (INDEPENDENT_AMBULATORY_CARE_PROVIDER_SITE_OTHER): Payer: BC Managed Care – PPO

## 2016-03-23 VITALS — BP 124/82 | HR 103 | Temp 99.3°F | Resp 16 | Ht 65.0 in | Wt 204.8 lb

## 2016-03-23 DIAGNOSIS — R0789 Other chest pain: Secondary | ICD-10-CM

## 2016-03-23 DIAGNOSIS — R509 Fever, unspecified: Secondary | ICD-10-CM | POA: Diagnosis not present

## 2016-03-23 DIAGNOSIS — R0602 Shortness of breath: Secondary | ICD-10-CM

## 2016-03-23 LAB — POCT CBC
Granulocyte percent: 64.1 %G (ref 37–80)
HCT, POC: 39.9 % (ref 37.7–47.9)
Hemoglobin: 14.1 g/dL (ref 12.2–16.2)
LYMPH, POC: 3.4 (ref 0.6–3.4)
MCH: 30.1 pg (ref 27–31.2)
MCHC: 35.3 g/dL (ref 31.8–35.4)
MCV: 85.2 fL (ref 80–97)
MID (CBC): 0.6 (ref 0–0.9)
MPV: 8.4 fL (ref 0–99.8)
POC Granulocyte: 7.2 — AB (ref 2–6.9)
POC LYMPH %: 30.6 % (ref 10–50)
POC MID %: 5.3 % (ref 0–12)
Platelet Count, POC: 308 10*3/uL (ref 142–424)
RBC: 4.69 M/uL (ref 4.04–5.48)
RDW, POC: 13 %
WBC: 11.2 10*3/uL — AB (ref 4.6–10.2)

## 2016-03-23 MED ORDER — AZITHROMYCIN 250 MG PO TABS: ORAL_TABLET | ORAL | 0 refills | Status: DC

## 2016-03-23 NOTE — Patient Instructions (Addendum)
Start Azithromycin Take 2 tabs x 1 dose, then 1 tab every day for x 4 days.   IF you received an x-ray today, you will receive an invoice from [facility]. Please contact [facility] at [phone] with questions or concerns regarding your invoice.   IF you received labwork today, you will receive an invoice from [facility]. Please contact [facility] at [phone] with questions or concerns regarding your invoice.   Our billing staff will not be able to assist you with questions regarding bills from these companies.  You will be contacted with the lab results as soon as they are available. The fastest way to get your results is to activate your My Chart account. Instructions are located on the last page of this paperwork. If you have not heard from us regarding the results in 2 weeks, please contact this office.     Chronic Bronchitis Chronic bronchitis is a lasting inflammation of the bronchial tubes, which are the tubes that carry air into your lungs. This is inflammation that occurs:  On most days of the week.  For at least three months at a time.  Over a period of two years in a row. When the bronchial tubes are inflamed, they start to produce mucus. The inflammation and buildup of mucus make it more difficult to breathe. Chronic bronchitis is usually a permanent problem and is one type of chronic obstructive pulmonary disease (COPD). People with chronic bronchitis are at greater risk for getting repeated colds, or respiratory infections. What are the causes? Chronic bronchitis most often occurs in people who have:  Long-standing, severe asthma.  A history of smoking.  Asthma and who also smoke. What are the signs or symptoms? Chronic bronchitis may cause the following:  A cough that brings up mucus (productive cough).  Shortness of breath.  Early morning headache.  Wheezing.  Chest discomfort.  Recurring respiratory  infections. How is this diagnosed? Your health care provider may confirm the diagnosis by:  Taking your medical history.  Performing a physical exam.  Taking a chest X-ray.  Performing pulmonary function tests. How is this treated? Treatment involves controlling symptoms with medicines, oxygen therapy, or making lifestyle changes, such as exercising and eating a healthy, well-balanced diet. Medicines could include:  Inhalers to improve air flow in and out of your lungs.  Antibiotics to treat bacterial infections, such as pneumonia, sinus infections, and acute bronchitis. As a preventative measure, your health care provider may recommend routine vaccinations for influenza and pneumonia. This is to prevent infection and hospitalization since you may be more at risk for these types of infections. Follow these instructions at home:  Take medicines only as directed by your health care provider.  If you smoke cigarettes, chew tobacco, or use electronic cigarettes, quit. If you need help quitting, ask your health care provider.  Avoid pollen, dust, animal dander, molds, smoke, and other things that cause shortness of breath or wheezing attacks.  Talk to your health care provider about possible exercise routines. Regular exercise is very important to help you feel better.  If you are prescribed oxygen use at home follow these guidelines:  Never smoke while using oxygen. Oxygen does not burn or explode, but flammable materials will burn faster in the presence of oxygen.  Keep a Government social research officerfire extinguisher close by. Let your fire department know that you have oxygen in your home.  Warn visitors not to smoke near you when you are using oxygen. Put up "no smoking"  signs in your home where you most often use the oxygen.  Regularly test your smoke detectors at home to make sure they work. If you receive care in your home from a nurse or other health care provider, he or she may also check to make sure  your smoke detectors work.  Ask your health care provider whether you would benefit from a pulmonary rehabilitation program.  Do not wait to get medical care if you have any concerning symptoms. Delays could cause permanent injury and may be life threatening. Contact a health care provider if:  You have increased coughing or shortness of breath or both.  You have muscle aches.  You have chest pain.  Your mucus gets thicker.  Your mucus changes from clear or white to yellow, green, gray, or bloody. Get help right away if:  Your usual medicines do not stop your wheezing.  You have increased difficulty breathing.  You have any problems with the medicine you are taking, such as a rash, itching, swelling, or trouble breathing. This information is not intended to replace advice given to you by your health care provider. Make sure you discuss any questions you have with your health care provider. Document Released: 01/19/2006 Document Revised: 08/12/2015 Document Reviewed: 05/12/2013 Elsevier Interactive Patient Education  2017 [employer]

## 2016-03-23 NOTE — Progress Notes (Signed)
Patient ID: Shelly [name], female    DOB: 1975/12/19, 40 y.o.   MRN: [id number]  PCP: Porfirio Oarhelle Jeffery, PA-C  Chief Complaint  Patient presents with  . Follow-up    sxs no better, hoarse    Subjective:   HPI 40 year old female, current everyday smoker,  presents for evaluation of chest and back burning sensation with cough. She experiences a sharp pain upper right chest wall with coughing. Also recently noticed a sharp pain in her middle upper back. Reports a continued fever, hoarseness of voice, extreme fatigue, sleepiness, wheezing and intermittent headaches. Pt was seen and treated for sinusitis on 03/20/16 and reports worsening of symptoms while on doxycyline. Reports improved congestion and now symptoms seen to be in her chest and she has chest heaviness.  Social History   Social History  . Marital status: Married    Spouse name: N/A  . Number of children: N/A  . Years of education: N/A   Occupational History  . Not on file.   Social History Main Topics  . Smoking status: Current Every Day Smoker    Packs/day: 1.00    Years: 26.00    Types: Cigarettes  . Smokeless tobacco: Never Used  . Alcohol use No  . Drug use: No  . Sexual activity: Yes   Other Topics Concern  . Not on file   Social History Narrative  . No narrative on file    Family History  Problem Relation Age of Onset  . Osteoporosis Mother   . Asthma Mother   . Diabetes Father   . Heart disease Father   . Kidney disease Maternal Grandmother   . Heart disease Maternal Grandfather   . Cancer Paternal Grandmother   . Heart disease Paternal Grandfather      Review of Systems See HPI   Patient Active Problem List   Diagnosis Date Noted  . 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 FO (foramen ovale) 09/09/2014  . Smoking addiction 06/05/2014  . Persistent asthma 06/05/2014  . GERD without esophagitis 06/05/2014  .  SARCOIDOSIS 04/05/2007  . ARTHRITIS 04/05/2007     Prior to Admission medications   Medication Sig Start Date End Date Taking? Authorizing Provider  albuterol (PROVENTIL HFA;VENTOLIN HFA) 108 (90 Base) MCG/ACT inhaler Inhale into the lungs as needed. 06/05/14  Yes Historical Provider, MD  aspirin (GOODSENSE ASPIRIN) 325 MG tablet Take 325 mg by mouth.   Yes Historical Provider, MD  benzonatate (TESSALON) 100 MG capsule Take 1-2 capsules (100-200 mg total) by mouth 3 (three) times daily as needed for cough. 03/20/16  Yes Chelle Jeffery, PA-C  cetirizine (ZYRTEC) 10 MG tablet Take 10 mg by mouth daily.   Yes Historical Provider, MD  doxycycline (VIBRAMYCIN) 100 MG capsule Take 1 capsule (100 mg total) by mouth 2 (two) times daily. 03/20/16 03/30/16 Yes Chelle Jeffery, PA-C  fluticasone (FLONASE) 50 MCG/ACT nasal spray Place 2 sprays into both nostrils daily. 01/20/16  Yes Elizabeth Whitney McVey, PA-C  Guaifenesin (MUCINEX MAXIMUM STRENGTH) 1200 MG TB12 Take 1 tablet (1,200 mg total) by mouth every 12 (twelve) hours as needed. 03/20/16  Yes Chelle Jeffery, PA-C  Multiple Vitamin (MULTIVITAMIN) tablet Take 1 tablet by mouth daily.   Yes Historical Provider, MD  venlafaxine XR (EFFEXOR-XR) 150 MG 24 hr capsule Take 1 capsule (150 mg total) by mouth daily with breakfast. 11/23/15  Yes Wallis BambergMario Mani, PA-C  venlafaxine XR (EFFEXOR-XR) 75 MG 24  hr capsule Take 75 mg by mouth daily with breakfast. Take one daily, with 150 mg, to achieve 225 mg total daily dose.   Yes Historical Provider, MD     Allergies  Allergen Reactions  . Clindamycin/Lincomycin   . Keflex [Cephalexin]   . Penicillins   . Sulfonamide Derivatives    Results for orders placed or performed in visit on 03/23/16  POCT CBC  Result Value Ref Range   WBC 11.2 (A) 4.6 - 10.2 K/uL   Lymph, poc 3.4 0.6 - 3.4   POC LYMPH PERCENT 30.6 10 - 50 %L   MID (cbc) 0.6 0 - 0.9   POC MID % 5.3 0 - 12 %M   POC Granulocyte 7.2 (A) 2 - 6.9   Granulocyte  percent 64.1 37 - 80 %G   RBC 4.69 4.04 - 5.48 M/uL   Hemoglobin 14.1 12.2 - 16.2 g/dL   HCT, POC 16.1 09.6 - 47.9 %   MCV 85.2 80 - 97 fL   MCH, POC 30.1 27 - 31.2 pg   MCHC 35.3 31.8 - 35.4 g/dL   RDW, POC 04.5 %   Platelet Count, POC 308 142 - 424 K/uL   MPV 8.4 0 - 99.8 fL     Objective:  Physical Exam  Constitutional: She is oriented to person, place, and time. She appears well-developed and well-nourished.  HENT:  Head: Normocephalic.  Right Ear: Hearing and external ear normal.  Left Ear: Hearing, tympanic membrane, external ear and ear canal normal.  Nose: Rhinorrhea present. No mucosal edema.  Mouth/Throat: Uvula is midline. Posterior oropharyngeal erythema present. No oropharyngeal exudate, posterior oropharyngeal edema or tonsillar abscesses.  Eyes: Conjunctivae and EOM are normal. Pupils are equal, round, and reactive to light.  Watery discharge bilateral eyes  Neck: Normal range of motion. Neck supple.  Cardiovascular: Regular rhythm, normal heart sounds and intact distal pulses.   Pulmonary/Chest: Effort normal. She has wheezes. She exhibits tenderness.  Diminished breath sounds with expiratory wheeze upper lobes bilaterally. Dry hacking cough noted during exam  Musculoskeletal: Normal range of motion.  Lymphadenopathy:    She has no cervical adenopathy.  Neurological: She is alert and oriented to person, place, and time.  Skin: Skin is warm.  Skin is warm and moist  Psychiatric: She has a normal mood and affect. Her behavior is normal. Judgment and thought content normal.    Dg Chest 2 View  Result Date: 03/23/2016 CLINICAL DATA:  Chest tenderness and shortness of breath. Cough for 3 days. EXAM: CHEST  2 VIEW COMPARISON:  March 02, 2015 FINDINGS: Scarring in the right base. No pneumothorax. The heart, hila, mediastinum, lungs, and pleura are otherwise unchanged and unremarkable. IMPRESSION: No active cardiopulmonary disease. Electronically Signed   By: Gerome Sam III M.D   On: 03/23/2016 17:52   Vitals:   03/23/16 1653  BP: 124/82  Pulse: (!) 103  Resp: 16  Temp: 99.3 F (37.4 C)   Assessment & Plan:  1. Chest wall tenderness - DG Chest 2 View 2. Low grade fever - POCT CBC 3. SOB (shortness of breath)  Pt presents with symptoms that closely reflect a pneumonia vs influenza. 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 I am going to tx empirically for pneumonia and add Azithromycin to Doxycycline.   Return for care if symptoms do not improve of fever  persists.

## 2016-03-27 ENCOUNTER — Encounter: Payer: Self-pay | Admitting: Family Medicine

## 2016-03-27 ENCOUNTER — Ambulatory Visit (INDEPENDENT_AMBULATORY_CARE_PROVIDER_SITE_OTHER): Payer: BC Managed Care – PPO | Admitting: Family Medicine

## 2016-03-27 ENCOUNTER — Ambulatory Visit (INDEPENDENT_AMBULATORY_CARE_PROVIDER_SITE_OTHER): Payer: BC Managed Care – PPO

## 2016-03-27 VITALS — BP 119/83 | HR 120 | Temp 98.3°F

## 2016-03-27 DIAGNOSIS — R0602 Shortness of breath: Secondary | ICD-10-CM | POA: Diagnosis not present

## 2016-03-27 DIAGNOSIS — R509 Fever, unspecified: Secondary | ICD-10-CM

## 2016-03-27 DIAGNOSIS — D72829 Elevated white blood cell count, unspecified: Secondary | ICD-10-CM | POA: Diagnosis not present

## 2016-03-27 LAB — POCT CBC
GRANULOCYTE PERCENT: 64 % (ref 37–80)
HCT, POC: 41.2 % (ref 37.7–47.9)
HEMOGLOBIN: 14.8 g/dL (ref 12.2–16.2)
Lymph, poc: 3.3 (ref 0.6–3.4)
MCH: 30.4 pg (ref 27–31.2)
MCHC: 35.8 g/dL — AB (ref 31.8–35.4)
MCV: 84.9 fL (ref 80–97)
MID (CBC): 0.7 (ref 0–0.9)
MPV: 8.1 fL (ref 0–99.8)
PLATELET COUNT, POC: 294 10*3/uL (ref 142–424)
POC Granulocyte: 7 — AB (ref 2–6.9)
POC LYMPH PERCENT: 29.9 %L (ref 10–50)
POC MID %: 6.1 %M (ref 0–12)
RBC: 4.85 M/uL (ref 4.04–5.48)
RDW, POC: 12.9 %
WBC: 11 10*3/uL — AB (ref 4.6–10.2)

## 2016-03-27 MED ORDER — CEFTRIAXONE SODIUM 1 G IJ SOLR
1.0000 g | Freq: Once | INTRAMUSCULAR | Status: AC
  Administered 2016-03-27: 1 g via INTRAMUSCULAR

## 2016-03-27 MED ORDER — IPRATROPIUM BROMIDE 0.02 % IN SOLN
0.5000 mg | Freq: Once | RESPIRATORY_TRACT | Status: AC
  Administered 2016-03-27: 0.5 mg via RESPIRATORY_TRACT

## 2016-03-27 MED ORDER — FLUTICASONE FUROATE-VILANTEROL 200-25 MCG/INH IN AEPB: 1.0000 | INHALATION_SPRAY | Freq: Every day | RESPIRATORY_TRACT | 11 refills | Status: DC

## 2016-03-27 MED ORDER — ALBUTEROL SULFATE (2.5 MG/3ML) 0.083% IN NEBU
2.5000 mg | INHALATION_SOLUTION | Freq: Once | RESPIRATORY_TRACT | Status: AC
  Administered 2016-03-27: 2.5 mg via RESPIRATORY_TRACT

## 2016-03-27 NOTE — Progress Notes (Signed)
Patient ID: Shelly [name], female    DOB: [birth year], 40 y.o.   MRN: [id number]  PCP: Porfirio Oarhelle Jeffery, PA-C  Chief Complaint  Patient presents with  . Follow-up    fever,sob, upper back pain     Subjective:   HPI 40 year old female presents for evaluation of fever, cough, and shortness of breath.  Lightheadedness, upper chest heaviness and aching back since prior visit on 03/27/16.No sore throat. Still experiencing hoarseness of voice. No nausea or vomiting. Continues to have shortness of breath, low grade fever (99), and fatigue. She has continued to smoke during illness although reports that she has decreased the number of cigarettes smoked per day.  Social History   Social History  . Marital status: Married    Spouse name: N/A  . Number of children: N/A  . Years of education: N/A   Occupational History  . Not on file.   Social History Main Topics  . Smoking status: Current Every Day Smoker    Packs/day: 1.00    Years: 26.00    Types: Cigarettes  . Smokeless tobacco: Never Used  . Alcohol use No  . Drug use: No  . Sexual activity: Yes   Other Topics Concern  . Not on file   Social History Narrative  . No narrative on file    Family History  Problem Relation Age of Onset  . Osteoporosis Mother   . Asthma Mother   . Diabetes Father   . Heart disease Father   . Kidney disease Maternal Grandmother   . Heart disease Maternal Grandfather   . Cancer Paternal Grandmother   . Heart disease Paternal Grandfather    Review of Systems See HPI  Patient Active Problem List   Diagnosis Date Noted  . 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 FO (foramen ovale) 09/09/2014  . Smoking addiction 06/05/2014  . Persistent asthma 06/05/2014  . GERD without esophagitis 06/05/2014  . SARCOIDOSIS 04/05/2007  . ARTHRITIS 04/05/2007     Prior to Admission medications   Medication Sig Start Date  End Date Taking? Authorizing Provider  albuterol (PROVENTIL HFA;VENTOLIN HFA) 108 (90 Base) MCG/ACT inhaler Inhale into the lungs as needed. 06/05/14   Historical Provider, MD  aspirin (GOODSENSE ASPIRIN) 325 MG tablet Take 325 mg by mouth.    Historical Provider, MD  azithromycin (ZITHROMAX) 250 MG tablet Take 2 tabs PO x 1 dose, then 1 tab PO QD x 4 days 03/23/16   Doyle AskewKimberly Stephenia Aisia Correira, FNP  benzonatate (TESSALON) 100 MG capsule Take 1-2 capsules (100-200 mg total) by mouth 3 (three) times daily as needed for cough. 03/20/16   Chelle Jeffery, PA-C  cetirizine (ZYRTEC) 10 MG tablet Take 10 mg by mouth daily.    Historical Provider, MD  doxycycline (VIBRAMYCIN) 100 MG capsule Take 1 capsule (100 mg total) by mouth 2 (two) times daily. 03/20/16 03/30/16  Chelle Jeffery, PA-C  fluticasone (FLONASE) 50 MCG/ACT nasal spray Place 2 sprays into both nostrils daily. 01/20/16   Elizabeth Whitney McVey, PA-C  Guaifenesin (MUCINEX MAXIMUM STRENGTH) 1200 MG TB12 Take 1 tablet (1,200 mg total) by mouth every 12 (twelve) hours as needed. 03/20/16   Chelle Jeffery, PA-C  Multiple Vitamin (MULTIVITAMIN) tablet Take 1 tablet by mouth daily.    Historical Provider, MD  venlafaxine XR (EFFEXOR-XR) 150 MG 24 hr capsule Take 1 capsule (150 mg total) by mouth daily with breakfast. 11/23/15   Wallis BambergMario Mani, PA-C  venlafaxine XR (EFFEXOR-XR) 75 MG 24 hr capsule Take 75 mg by mouth daily with breakfast. Take one daily, with 150 mg, to achieve 225 mg total daily dose.    Historical Provider, MD     Allergies  Allergen Reactions  . Clindamycin/Lincomycin   . Keflex [Cephalexin]   . Penicillins   . Sulfonamide Derivatives        Objective:  Physical Exam  Constitutional: She is oriented to person, place, and time. She appears well-developed and well-nourished.  HENT:  Head: Normocephalic and atraumatic.  Right Ear: External ear normal.  Left Ear: External ear normal.  Mouth/Throat: Oropharynx is clear and moist.    Neck: Normal range of motion. Neck supple.  Cardiovascular: Normal rate, regular rhythm, normal heart sounds and intact distal pulses.   Pulmonary/Chest: Effort normal. She has decreased breath sounds in the right upper field, the right lower field, the left upper field and the left middle field. She has rales in the right middle field.  Musculoskeletal: Normal range of motion.  Lymphadenopathy:    She has cervical adenopathy.  Neurological: She is alert and oriented to person, place, and time.  Skin: Skin is warm and dry.  Psychiatric: She has a normal mood and affect. Her behavior is normal. Judgment and thought content normal.    Today's Vitals   03/27/16 1351  BP: 119/83  Pulse: (!) 120  Temp: 98.3 F (36.8 C)  TempSrc: Oral     Assessment & Plan:  1. Fever, unspecified fever cause - POCT CBC - DG Chest 2 View  2. Leukocytosis, unspecified type -Ceftriaxone (ROCEPHIN) injection 1 g; Inject 1 g into the muscle once. - CBC with Differential/Platelet  3. Shortness of breath - Albuterol (PROVENTIL) (2.5 MG/3ML) 0.083% nebulizer solution 2.5 mg; Take 3 mLs (2.5 mg total) by nebulization once. - Ipratropium (ATROVENT) nebulizer solution 0.5 mg; Take 2.5 mLs (0.5 mg total) by nebulization once. Plan: Start Breo Ellipta inhaler 1 puff daily every day.  Patient presents afebrile today, although white count remains mildly elevated. Ordering a CBC w/differential to determine neutrophils vs lymphocytes as being elevated.  Patient has been on two antibiotics and received a IM dose of Rocephin today. Patient may be experiencing a form of viral respiratory illness if she continues to be non-responsive to antibiotics. Will have patient return for a lab follow-up only on Wednesday 03/29/16.   [name], MSN, FNP-C [facility]

## 2016-03-27 NOTE — Patient Instructions (Addendum)
Return for follow-up in two days for repeat labs.  Complete Doxycycline 100 mg twice daily.  Start Breo Ellipta Inhaler daily-1 puff once daily every day.   IF you received an x-ray today, you will receive an invoice from [facility]. Please contact [facility] at [phone] with questions or concerns regarding your invoice.   IF you received labwork today, you will receive an invoice from [facility]. Please contact [facility] at [phone] with questions or concerns regarding your invoice.   Our billing staff will not be able to assist you with questions regarding bills from these companies.  You will be contacted with the lab results as soon as they are available. The fastest way to get your results is to activate your My Chart account. Instructions are located on the last page of this paperwork. If you have not heard from us regarding the results in 2 weeks, please contact this office.    Community-Acquired Pneumonia, Adult Introduction Pneumonia is an infection of the lungs. One type of pneumonia can happen while a person is in a hospital. A different type can happen when a person is not in a hospital (community-acquired pneumonia). It is easy for this kind to spread from person to person. It can spread to you if you breathe near an infected person who coughs or sneezes. Some symptoms include:  A dry cough.  A wet (productive) cough.  Fever.  Sweating.  Chest pain. Follow these instructions at home:  Take over-the-counter and prescription medicines only as told by your doctor.  Only take cough medicine if you are losing sleep.  If you were prescribed an antibiotic medicine, take it as told by your doctor. Do not stop taking the antibiotic even if you start to feel better.  Sleep with your head and neck raised (elevated). You can do this by putting a few pillows under your head, or you can sleep in a recliner.  Do not use  tobacco products. These include cigarettes, chewing tobacco, and e-cigarettes. If you need help quitting, ask your doctor.  Drink enough water to keep your pee (urine) clear or pale yellow. A shot (vaccine) can help prevent pneumonia. Shots are often suggested for:  People older than 42 years of age.  People older than 42 years of age:  Who are having cancer treatment.  Who have long-term (chronic) lung disease.  Who have problems with their body's defense system (immune system). You may also prevent pneumonia if you take these actions:  Get the flu (influenza) shot every year.  Go to the dentist as often as told.  Wash your hands often. If soap and water are not available, use hand sanitizer. Contact a doctor if:  You have a fever.  You lose sleep because your cough medicine does not help. Get help right away if:  You are short of breath and it gets worse.  You have more chest pain.  Your sickness gets worse. This is very serious if:  You are an older adult.  Your body's defense system is weak.  You cough up blood. This information is not intended to replace advice given to you by your health care provider. Make sure you discuss any questions you have with your health care provider. Document Released: 09/20/2007 Document Revised: 09/09/2015 Document Reviewed: 07/29/2014  2017 Elsevier

## 2016-03-29 ENCOUNTER — Other Ambulatory Visit (INDEPENDENT_AMBULATORY_CARE_PROVIDER_SITE_OTHER): Payer: BC Managed Care – PPO

## 2016-03-29 ENCOUNTER — Telehealth: Payer: Self-pay | Admitting: Family Medicine

## 2016-03-29 DIAGNOSIS — D72828 Other elevated white blood cell count: Secondary | ICD-10-CM

## 2016-03-29 LAB — POCT CBC
Granulocyte percent: 68.2 %G (ref 37–80)
HCT, POC: 38.4 % (ref 37.7–47.9)
HEMOGLOBIN: 13.5 g/dL (ref 12.2–16.2)
LYMPH, POC: 3.1 (ref 0.6–3.4)
MCH, POC: 30.1 pg (ref 27–31.2)
MCHC: 35.1 g/dL (ref 31.8–35.4)
MCV: 85.9 fL (ref 80–97)
MID (cbc): 0.7 (ref 0–0.9)
MPV: 8.1 fL (ref 0–99.8)
POC Granulocyte: 8.1 — AB (ref 2–6.9)
POC LYMPH PERCENT: 26 %L (ref 10–50)
POC MID %: 5.8 %M (ref 0–12)
Platelet Count, POC: 288 10*3/uL (ref 142–424)
RBC: 4.47 M/uL (ref 4.04–5.48)
RDW, POC: 12.8 %
WBC: 11.9 10*3/uL — AB (ref 4.6–10.2)

## 2016-03-29 NOTE — Telephone Encounter (Signed)
Please add on CBC with Differential

## 2016-03-30 ENCOUNTER — Encounter: Payer: Self-pay | Admitting: Family Medicine

## 2016-03-30 ENCOUNTER — Telehealth: Payer: Self-pay | Admitting: Family Medicine

## 2016-03-30 LAB — CBC WITH DIFFERENTIAL/PLATELET
BASOS ABS: 0 10*3/uL (ref 0.0–0.2)
Basos: 0 %
EOS (ABSOLUTE): 0 10*3/uL (ref 0.0–0.4)
EOS: 0 %
HEMATOCRIT: 39.5 % (ref 34.0–46.6)
Hemoglobin: 13.3 g/dL (ref 11.1–15.9)
IMMATURE GRANULOCYTES: 0 %
Immature Grans (Abs): 0 10*3/uL (ref 0.0–0.1)
LYMPHS ABS: 3.3 10*3/uL — AB (ref 0.7–3.1)
Lymphs: 28 %
MCH: 29 pg (ref 26.6–33.0)
MCHC: 33.7 g/dL (ref 31.5–35.7)
MCV: 86 fL (ref 79–97)
MONOCYTES: 5 %
MONOS ABS: 0.5 10*3/uL (ref 0.1–0.9)
NEUTROS PCT: 67 %
Neutrophils Absolute: 7.7 10*3/uL — ABNORMAL HIGH (ref 1.4–7.0)
PLATELETS: 346 10*3/uL (ref 150–379)
RBC: 4.58 x10E6/uL (ref 3.77–5.28)
RDW: 12.9 % (ref 12.3–15.4)
WBC: 11.6 10*3/uL — AB (ref 3.4–10.8)

## 2016-03-30 NOTE — Progress Notes (Signed)
March 30, 2016   [place] [address] [place] [address] [place] [id number]   Dear Ms. Kalman Jewelsinson,  Below are the results from your recent visit indicated a likely viral infection and that is improving.  Resulted Orders  CBC with Differential/Platelet  Result Value Ref Range   WBC 11.6 (H) 3.4 - 10.8 x10E3/uL   RBC 4.58 3.77 - 5.28 x10E6/uL   Hemoglobin 13.3 11.1 - 15.9 g/dL     Comment:                   **Please note reference interval change**   Hematocrit 39.5 34.0 - 46.6 %   MCV 86 79 - 97 fL   MCH 29.0 26.6 - 33.0 pg   MCHC 33.7 31.5 - 35.7 g/dL   RDW 29.512.9 62.112.3 - 30.815.4 %   Platelets 346 150 - 379 x10E3/uL   Neutrophils 67 Not Estab. %   Lymphs 28 Not Estab. %   Monocytes 5 Not Estab. %   Eos 0 Not Estab. %   Basos 0 Not Estab. %   Neutrophils Absolute 7.7 (H) 1.4 - 7.0 x10E3/uL   Lymphocytes Absolute 3.3 (H) 0.7 - 3.1 x10E3/uL   Monocytes Absolute 0.5 0.1 - 0.9 x10E3/uL   EOS (ABSOLUTE) 0.0 0.0 - 0.4 x10E3/uL   Basophils Absolute 0.0 0.0 - 0.2 x10E3/uL   Immature Granulocytes 0 Not Estab. %   Immature Grans (Abs) 0.0 0.0 - 0.1 x10E3/uL   Narrative   Performed at:  [address] - [facility] [address], [place], [place]  [id number] Lab Director: Mila HomerWilliam F Hancock MD, Phone:  [phone]     If you have any questions or concerns, please don't hesitate to call.  Sincerely,   Joaquin CourtsKimberly Ephram Kornegay, FNP

## 2016-03-30 NOTE — Telephone Encounter (Signed)
Out of the office but I  will contact patient tomorrow regarding lab work I sent off to [facility] which indicates that she most likely has a viral illness that will improve with time.   [name], MSN, FNP-C [facility]

## 2016-03-30 NOTE — Telephone Encounter (Signed)
Pt states that she has a bad ST fever chills she's getting worst please respond

## 2016-03-30 NOTE — Telephone Encounter (Signed)
Spoke with pt and she states that while yesterday she was starting to feel better, today she has had a fever on/off (21F).  Please advise.

## 2016-04-06 ENCOUNTER — Ambulatory Visit: Payer: BC Managed Care – PPO

## 2016-05-02 ENCOUNTER — Ambulatory Visit (INDEPENDENT_AMBULATORY_CARE_PROVIDER_SITE_OTHER): Payer: BC Managed Care – PPO | Admitting: Family Medicine

## 2016-05-02 VITALS — BP 126/80 | HR 119 | Temp 99.2°F | Resp 18 | Ht 65.0 in | Wt 205.0 lb

## 2016-05-02 DIAGNOSIS — R0981 Nasal congestion: Secondary | ICD-10-CM

## 2016-05-02 DIAGNOSIS — J029 Acute pharyngitis, unspecified: Secondary | ICD-10-CM | POA: Diagnosis not present

## 2016-05-02 DIAGNOSIS — J040 Acute laryngitis: Secondary | ICD-10-CM | POA: Diagnosis not present

## 2016-05-02 LAB — POCT RAPID STREP A (OFFICE): Rapid Strep A Screen: NEGATIVE

## 2016-05-02 MED ORDER — AZITHROMYCIN 250 MG PO TABS: ORAL_TABLET | ORAL | 0 refills | Status: DC

## 2016-05-02 NOTE — Patient Instructions (Addendum)
Gargle with salt water for throat soreness.  Start Start Azithromycin Take 2 tabs x 1 dose, then 1 tab every day for x 4 days  Return for follow-up if symptoms worsen for do not improve.    IF you received an x-ray today, you will receive an invoice from [facility]. Please contact [facility] at [phone] with questions or concerns regarding your invoice.   IF you received labwork today, you will receive an invoice from [facility]. Please contact [facility] at [phone] with questions or concerns regarding your invoice.   Our billing staff will not be able to assist you with questions regarding bills from these companies.  You will be contacted with the lab results as soon as they are available. The fastest way to get your results is to activate your My Chart account. Instructions are located on the last page of this paperwork. If you have not heard from [place] regarding the results in 2 weeks, please contact this office.    Pharyngitis Pharyngitis is redness, pain, and swelling (inflammation) of your pharynx. What are the causes? Pharyngitis is usually caused by infection. Most of the time, these infections are from viruses (viral) and are part of a cold. However, sometimes pharyngitis is caused by bacteria (bacterial). Pharyngitis can also be caused by allergies. Viral pharyngitis may be spread from person to person by coughing, sneezing, and personal items or utensils (cups, forks, spoons, toothbrushes). Bacterial pharyngitis may be spread from person to person by more intimate contact, such as kissing. What are the signs or symptoms? Symptoms of pharyngitis include:  Sore throat.  Tiredness (fatigue).  Low-grade fever.  Headache.  Joint pain and muscle aches.  Skin rashes.  Swollen lymph nodes.  Plaque-like film on throat or tonsils (often seen with bacterial pharyngitis). How is this diagnosed? Your health care provider will ask you questions about your  illness and your symptoms. Your medical history, along with a physical exam, is often all that is needed to diagnose pharyngitis. Sometimes, a rapid strep test is done. Other lab tests may also be done, depending on the suspected cause. How is this treated? Viral pharyngitis will usually get better in 3-4 days without the use of medicine. Bacterial pharyngitis is treated with medicines that kill germs (antibiotics). Follow these instructions at home:  Drink enough water and fluids to keep your urine clear or pale yellow.  Only take over-the-counter or prescription medicines as directed by your health care provider:  If you are prescribed antibiotics, make sure you finish them even if you start to feel better.  Do not take aspirin.  Get lots of rest.  Gargle with 8 oz of salt water ( tsp of salt per 1 qt of water) as often as every 1-2 hours to soothe your throat.  Throat lozenges (if you are not at risk for choking) or sprays may be used to soothe your throat. Contact a health care provider if:  You have large, tender lumps in your neck.  You have a rash.  You cough up green, yellow-brown, or bloody spit. Get help right away if:  Your neck becomes stiff.  You drool or are unable to swallow liquids.  You vomit or are unable to keep medicines or liquids down.  You have severe pain that does not go away with the use of recommended medicines.  You have trouble breathing (not caused by a stuffy nose). This information is not intended to replace advice given to you by your health care provider.  Make sure you discuss any questions you have with your health care provider. Document Released: 04/03/2005 Document Revised: 09/09/2015 Document Reviewed: 12/09/2012 Elsevier Interactive Patient Education  2017 [employer]

## 2016-05-02 NOTE — Progress Notes (Signed)
Patient ID: Shelly [name], female    DOB: 1976/03/02, 41 y.o.   MRN: [id number]  PCP: Porfirio Oarhelle Jeffery, PA-C  Chief Complaint  Patient presents with  . Sore Throat  . Laryngitis    Subjective:  HPI 41 year old female presents for evaluation of sore throat, nasal congestion, and hoarseness of voice x 4 days. Reports left side of throat is very painful to swallow, worsening fatigue and overall "just not feeling well".  Began to loose voice x 1 days ago. Denies fever. Sucking on throat lozenges too sooth throat pain.  Social History   Social History  . Marital status: Married    Spouse name: N/A  . Number of children: N/A  . Years of education: N/A   Occupational History  . Not on file.   Social History Main Topics  . Smoking status: Current Every Day Smoker    Packs/day: 1.00    Years: 26.00    Types: Cigarettes  . Smokeless tobacco: Never Used  . Alcohol use No  . Drug use: No  . Sexual activity: Yes   Other Topics Concern  . Not on file   Social History Narrative  . No narrative on file    Family History  Problem Relation Age of Onset  . Osteoporosis Mother   . Asthma Mother   . Diabetes Father   . Heart disease Father   . Kidney disease Maternal Grandmother   . Heart disease Maternal Grandfather   . Cancer Paternal Grandmother   . Heart disease Paternal Grandfather    Review of Systems See HPI Patient Active Problem List   Diagnosis Date Noted  . 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 FO (foramen ovale) 09/09/2014  . Smoking addiction 06/05/2014  . Persistent asthma 06/05/2014  . GERD without esophagitis 06/05/2014  . SARCOIDOSIS 04/05/2007  . ARTHRITIS 04/05/2007    Allergies  Allergen Reactions  . Clindamycin/Lincomycin   . Keflex [Cephalexin]   . Penicillins   . Sulfonamide Derivatives     Prior to Admission medications   Medication Sig Start Date End Date  Taking? Authorizing Provider  albuterol (PROVENTIL HFA;VENTOLIN HFA) 108 (90 Base) MCG/ACT inhaler Inhale into the lungs as needed. 06/05/14  Yes Historical Provider, MD  aspirin (GOODSENSE ASPIRIN) 325 MG tablet Take 325 mg by mouth.   Yes Historical Provider, MD  cetirizine (ZYRTEC) 10 MG tablet Take 10 mg by mouth daily.   Yes Historical Provider, MD  fluticasone (FLONASE) 50 MCG/ACT nasal spray Place 2 sprays into both nostrils daily. 01/20/16  Yes Elizabeth Whitney McVey, PA-C  fluticasone furoate-vilanterol (BREO ELLIPTA) 200-25 MCG/INH AEPB Inhale 1 puff into the lungs daily. 03/27/16  Yes Doyle AskewKimberly Stephenia Sheletha Bow, FNP  Guaifenesin [facility](MUCINEX MAXIMUM STRENGTH) 1200 MG TB12 Take 1 tablet (1,200 mg total) by mouth every 12 (twelve) hours as needed. 03/20/16  Yes Chelle Jeffery, PA-C  Multiple Vitamin (MULTIVITAMIN) tablet Take 1 tablet by mouth daily.   Yes Historical Provider, MD  venlafaxine XR (EFFEXOR-XR) 150 MG 24 hr capsule Take 1 capsule (150 mg total) by mouth daily with breakfast. 11/23/15  Yes Wallis BambergMario Mani, PA-C  venlafaxine XR (EFFEXOR-XR) 75 MG 24 hr capsule Take 75 mg by mouth daily with breakfast. Take one daily, with 150 mg, to achieve 225 mg total daily dose.   Yes Historical Provider, MD    Past Medical, Surgical Family and Social History reviewed and updated.    Objective:  Today's Vitals   05/02/16 1659  BP: 126/80  Pulse: (!) 119  Resp: 18  Temp: 99.2 F (37.3 C)  TempSrc: Oral  SpO2: 98%  Weight: 205 lb (93 kg)  Height: 5\' 5"  (1.651 m)    Wt Readings from Last 3 Encounters:  05/02/16 205 lb (93 kg)  03/23/16 204 lb 12.8 oz (92.9 kg)  03/20/16 204 lb (92.5 kg)   Physical Exam  Constitutional: She is oriented to person, place, and time. She appears well-developed and well-nourished.  HENT:  Right Ear: Hearing, tympanic membrane, external ear and ear canal normal.  Left Ear: Hearing, tympanic membrane, external ear and ear canal normal.  Nose: Mucosal edema  present.  Mouth/Throat: Mucous membranes are normal. Uvula swelling present. Posterior oropharyngeal edema and posterior oropharyngeal erythema present.  Eyes: Conjunctivae are normal. Pupils are equal, round, and reactive to light.  Neck: Normal range of motion.  Cardiovascular: Normal heart sounds and intact distal pulses.   Tachycardic   Pulmonary/Chest: Effort normal and breath sounds normal.  Lymphadenopathy:    She has cervical adenopathy.  Neurological: She is alert and oriented to person, place, and time.  Skin: Skin is warm and dry.  Psychiatric: She has a normal mood and affect. Her behavior is normal. Judgment and thought content normal.     Assessment & Plan:  1. Sore throat - POCT rapid strep A - Culture, Group A Strep 2. Laryngitis 3. Nasal congestion  Plan: Treating empirically for streptococcal infection based on physical exam  -Start Azithromycin Take 2 tabs x 1 dose, then 1 tab every day for x 4 days -Continue warm salt water gargles to soothe painful throat.  Godfrey Pick. Tiburcio Pea, MSN, FNP-C [facility] Care at [facility] [phone]

## 2016-05-05 ENCOUNTER — Telehealth: Payer: Self-pay | Admitting: Family Medicine

## 2016-05-05 LAB — CULTURE, GROUP A STREP

## 2016-05-05 NOTE — Telephone Encounter (Signed)
Phoned patient to advise positive strep. I already treated empirically for strep. Advised her to follow-up as needed.  [name], MSN, FNP-C [facility] at [facility] [phone]

## 2016-05-16 ENCOUNTER — Ambulatory Visit (INDEPENDENT_AMBULATORY_CARE_PROVIDER_SITE_OTHER): Payer: BC Managed Care – PPO | Admitting: Physician Assistant

## 2016-05-16 VITALS — BP 120/80 | HR 101 | Temp 98.9°F | Resp 18 | Ht 65.0 in | Wt 205.0 lb

## 2016-05-16 DIAGNOSIS — Z8673 Personal history of transient ischemic attack (TIA), and cerebral infarction without residual deficits: Secondary | ICD-10-CM | POA: Diagnosis not present

## 2016-05-16 DIAGNOSIS — R42 Dizziness and giddiness: Secondary | ICD-10-CM

## 2016-05-16 DIAGNOSIS — R931 Abnormal findings on diagnostic imaging of heart and coronary circulation: Secondary | ICD-10-CM

## 2016-05-16 DIAGNOSIS — R82998 Other abnormal findings in urine: Secondary | ICD-10-CM

## 2016-05-16 DIAGNOSIS — E785 Hyperlipidemia, unspecified: Secondary | ICD-10-CM | POA: Diagnosis not present

## 2016-05-16 DIAGNOSIS — R9431 Abnormal electrocardiogram [ECG] [EKG]: Secondary | ICD-10-CM

## 2016-05-16 DIAGNOSIS — R8299 Other abnormal findings in urine: Secondary | ICD-10-CM

## 2016-05-16 DIAGNOSIS — Z23 Encounter for immunization: Secondary | ICD-10-CM | POA: Diagnosis not present

## 2016-05-16 LAB — POCT URINALYSIS DIP (MANUAL ENTRY)
BILIRUBIN UA: NEGATIVE
Bilirubin, UA: NEGATIVE
Blood, UA: NEGATIVE
Glucose, UA: NEGATIVE
NITRITE UA: NEGATIVE
PROTEIN UA: NEGATIVE
Spec Grav, UA: 1.015
Urobilinogen, UA: 0.2
pH, UA: 6

## 2016-05-16 LAB — POC MICROSCOPIC URINALYSIS (UMFC)

## 2016-05-16 LAB — GLUCOSE, POCT (MANUAL RESULT ENTRY): POC Glucose: 93 mg/dl (ref 70–99)

## 2016-05-16 MED ORDER — MECLIZINE HCL 12.5 MG PO TABS: 12.5000 mg | ORAL_TABLET | Freq: Three times a day (TID) | ORAL | 0 refills | Status: DC | PRN

## 2016-05-16 MED ORDER — PROMETHAZINE HCL 12.5 MG PO TABS: 12.5000 mg | ORAL_TABLET | Freq: Four times a day (QID) | ORAL | 0 refills | Status: DC | PRN

## 2016-05-16 NOTE — Patient Instructions (Signed)
     IF you received an x-ray today, you will receive an invoice from [facility]. Please contact [facility] at [phone] with questions or concerns regarding your invoice.   IF you received labwork today, you will receive an invoice from [facility]. Please contact [facility] at [phone] with questions or concerns regarding your invoice.   Our billing staff will not be able to assist you with questions regarding bills from these companies.  You will be contacted with the lab results as soon as they are available. The fastest way to get your results is to activate your My Chart account. Instructions are located on the last page of this paperwork. If you have not heard from us regarding the results in 2 weeks, please contact this office.     m

## 2016-05-16 NOTE — Progress Notes (Signed)
05/17/2016 12:52 PM   DOB: 1975-08-24 / MRN: [id number]  SUBJECTIVE:  Shelly [name] is a 41 y.o. female presenting for "feeling odd" which started yesterday. Associates feeling "lightheaded," HA and dizzy (both room spinning, and orthostatic).  She also complains of some mild pain in her right ear. Reports an episode of strep throat roughly 3 weeks ago.  She has not had the flu shot.   She has a history of stroked secondary to patent PFO.  She does not have a neurologist.  She has been told that she needs a sleep study.  She would like to see neurology.   She is allergic to clindamycin/lincomycin; keflex [cephalexin]; penicillins; and sulfonamide derivatives.   She  has a past medical history of Allergy; Anemia; Anxiety; Arthritis; Depression; GERD (gastroesophageal reflux disease); Migraines; Miscarriage; PFO (patent foramen ovale); Sarcoidosis ([place]); Stroke [facility]); and TIA (transient ischemic attack).    She  reports that she has been smoking Cigarettes.  She has a 26.00 pack-year smoking history. She has never used smokeless tobacco. She reports that she does not drink alcohol or use drugs. She  reports that she currently engages in sexual activity. The patient  has a past surgical history that includes Cholecystectomy; Nasal sinus surgery; Hand surgery; Bronchoscopy; Cesarean section; and miscarriage (08/10/14).  Her family history includes Asthma in her mother; Cancer in her paternal grandmother; Diabetes in her father; Heart disease in her father, maternal grandfather, and paternal grandfather; Kidney disease in her maternal grandmother; Osteoporosis in her mother.  Review of Systems  Constitutional: Negative for chills and fever.  HENT: Negative for sore throat.   Cardiovascular: Negative for chest pain.  Gastrointestinal: Negative for nausea.  Musculoskeletal: Negative for myalgias.  Skin: Negative for itching and rash.  Neurological: Negative for dizziness.    The problem list and  medications were reviewed and updated by myself where necessary and exist elsewhere in the encounter.   OBJECTIVE:  BP 120/80   Pulse (!) 101   Temp 98.9 F (37.2 C) (Oral)   Resp 18   Ht 5' 5"  (1.651 m)   Wt 205 lb (93 kg)   LMP 04/28/2016   SpO2 96%   BMI 34.11 kg/m   Pulse Readings from Last 3 Encounters:  05/16/16 (!) 101  05/02/16 (!) 119  03/27/16 (!) 120   Wt Readings from Last 3 Encounters:  05/16/16 205 lb (93 kg)  05/02/16 205 lb (93 kg)  03/23/16 204 lb 12.8 oz (92.9 kg)   Physical Exam  Constitutional: She is oriented to person, place, and time. She appears well-developed and well-nourished. No distress.  HENT:  Right Ear: Tympanic membrane normal.  Left Ear: Tympanic membrane normal.  Ears:  Nose: Nose normal.  Mouth/Throat: Oropharynx is clear and moist. No oropharyngeal exudate.  Cardiovascular: Normal rate, regular rhythm and normal heart sounds.  Exam reveals no gallop and no friction rub.   No murmur heard. Pulmonary/Chest: Effort normal and breath sounds normal.  Musculoskeletal: Normal range of motion. She exhibits no edema, tenderness or deformity.  Neurological: She is alert and oriented to person, place, and time.  Skin: Skin is warm and dry. She is not diaphoretic.  Psychiatric: She has a normal mood and affect.   Results for orders placed or performed in visit on 05/16/16 (from the past 72 hour(s))  CMP14+EGFR     Status: None   Collection Time: 05/16/16  5:08 PM  Result Value Ref Range   Glucose 91 65 - 99 mg/dL  BUN 8 6 - 24 mg/dL   Creatinine, Ser 0.64 0.57 - 1.00 mg/dL   GFR calc non Af Amer 110 >59 mL/min/1.73   GFR calc Af Amer 127 >59 mL/min/1.73   BUN/Creatinine Ratio 13 9 - 23   Sodium 138 134 - 144 mmol/L   Potassium 4.4 3.5 - 5.2 mmol/L   Chloride 96 96 - 106 mmol/L   CO2 25 18 - 29 mmol/L   Calcium 9.9 8.7 - 10.2 mg/dL   Total Protein 7.3 6.0 - 8.5 g/dL   Albumin 5.0 3.5 - 5.5 g/dL   Globulin, Total 2.3 1.5 - 4.5 g/dL     Albumin/Globulin Ratio 2.2 1.2 - 2.2   Bilirubin Total <0.2 0.0 - 1.2 mg/dL   Alkaline Phosphatase 77 39 - 117 IU/L   AST 16 0 - 40 IU/L   ALT 25 0 - 32 IU/L  TSH     Status: None   Collection Time: 05/16/16  5:08 PM  Result Value Ref Range   TSH 1.640 0.450 - 4.500 uIU/mL  POCT glucose (manual entry)     Status: None   Collection Time: 05/16/16  5:20 PM  Result Value Ref Range   POC Glucose 93 70 - 99 mg/dl  POCT urinalysis dipstick     Status: Abnormal   Collection Time: 05/16/16  5:31 PM  Result Value Ref Range   Color, UA yellow yellow   Clarity, UA clear clear   Glucose, UA negative negative   Bilirubin, UA negative negative   Ketones, POC UA negative negative   Spec Grav, UA 1.015    Blood, UA negative negative   pH, UA 6.0    Protein Ur, POC negative negative   Urobilinogen, UA 0.2    Nitrite, UA Negative Negative   Leukocytes, UA Trace (A) Negative  POCT Microscopic Urinalysis ([facility])     Status: Abnormal   Collection Time: 05/16/16  5:44 PM  Result Value Ref Range   WBC,UR,HPF,POC Few (A) None WBC/hpf   RBC,UR,HPF,POC Few (A) None RBC/hpf   Bacteria Few (A) None, Too numerous to count   Mucus Present (A) Absent   Epithelial Cells, UR Per Microscopy Few (A) None, Too numerous to count cells/hpf  Lipid panel     Status: Abnormal   Collection Time: 05/16/16  6:22 PM  Result Value Ref Range   Cholesterol, Total 244 (H) 100 - 199 mg/dL   Triglycerides 115 0 - 149 mg/dL   HDL 46 >39 mg/dL   VLDL Cholesterol Cal 23 5 - 40 mg/dL   LDL Calculated 175 (H) 0 - 99 mg/dL   Chol/HDL Ratio 5.3 (H) 0.0 - 4.4 ratio units    Comment:                                   T. Chol/HDL Ratio                                             Men  Women                               1/2 Avg.Risk  3.4    3.3  Avg.Risk  5.0    4.4                                2X Avg.Risk  9.6    7.1                                3X Avg.Risk 23.4   11.0     No results  found.  ASSESSMENT AND PLAN:  Juanna was seen today for dizziness, nausea and ear pain.  Diagnoses and all orders for this visit:  Dizziness: No overt cause. EKG showing q waves however this was present in 2011 and she has seen cards since thus normal. Urine appears contaminated and she has no symptoms of UTI.  Will treat for BPPV.  Will refer to neurology given history of stroke and complaint of "I've been told I need a sleep study."  -     CMP14+EGFR -     TSH -     Basic metabolic panel -     EKG 07-PXTG -     POCT urinalysis dipstick -     POCT glucose (manual entry) -     meclizine (ANTIVERT) 12.5 MG tablet; Take 1-2 tablets (12.5-25 mg total) by mouth 3 (three) times daily as needed for dizziness. -     promethazine (PHENERGAN) 12.5 MG tablet; Take 1-2 tablets (12.5-25 mg total) by mouth every 6 (six) hours as needed for nausea or vomiting.  Urine leukocytes -     POCT Microscopic Urinalysis ([facility])  Abnormal Q waves on electrocardiogram Comments: Pre-exisiting Orders: -     Lipid panel -     Ambulatory referral to Cardiology  History of CVA in adulthood -     Ambulatory referral to Neurology  Need for prophylactic vaccination and inoculation against influenza -     Flu Vaccine QUAD 36+ mos PF IM (Fluarix & Fluzone Quad PF)  Dyslipidemia: She is not meeting criteria per ASCVD (4.5%).  Her previous stroke was note athrogenic but due to a patent PFO.  She is taking ASA daily and I have advised she continue this.  Will await neurologies opinion on statin therapy for this patient, as despite her low relative risk she is a smoker.   Other orders -     Cancel: Flu vaccine > 3yo with preservative IM (Fluvirin Influenza split)    The patient is advised to call or return to clinic if she does not see an improvement in symptoms, or to seek the care of the closest emergency department if she worsens with the above plan.   Philis Fendt, MHS, PA-C [facility] 05/17/2016 12:52 PM

## 2016-05-17 ENCOUNTER — Other Ambulatory Visit: Payer: Self-pay | Admitting: Urgent Care

## 2016-05-17 DIAGNOSIS — F32A Depression, unspecified: Secondary | ICD-10-CM

## 2016-05-17 DIAGNOSIS — F329 Major depressive disorder, single episode, unspecified: Secondary | ICD-10-CM

## 2016-05-17 LAB — LIPID PANEL
Chol/HDL Ratio: 5.3 ratio units — ABNORMAL HIGH (ref 0.0–4.4)
Cholesterol, Total: 244 mg/dL — ABNORMAL HIGH (ref 100–199)
HDL: 46 mg/dL (ref >39)
LDL Calculated: 175 mg/dL — ABNORMAL HIGH (ref 0–99)
TRIGLYCERIDES: 115 mg/dL (ref 0–149)
VLDL Cholesterol Cal: 23 mg/dL (ref 5–40)

## 2016-05-17 LAB — CMP14+EGFR
A/G RATIO: 2.2 (ref 1.2–2.2)
ALT: 25 IU/L (ref 0–32)
AST: 16 IU/L (ref 0–40)
Albumin: 5 g/dL (ref 3.5–5.5)
Alkaline Phosphatase: 77 IU/L (ref 39–117)
BUN/Creatinine Ratio: 13 (ref 9–23)
BUN: 8 mg/dL (ref 6–24)
Bilirubin Total: <0.2 mg/dL (ref 0.0–1.2)
CALCIUM: 9.9 mg/dL (ref 8.7–10.2)
CO2: 25 mmol/L (ref 18–29)
CREATININE: 0.64 mg/dL (ref 0.57–1.00)
Chloride: 96 mmol/L (ref 96–106)
GFR, EST AFRICAN AMERICAN: 127 mL/min/{1.73_m2} (ref >59)
GFR, EST NON AFRICAN AMERICAN: 110 mL/min/{1.73_m2} (ref >59)
GLOBULIN, TOTAL: 2.3 g/dL (ref 1.5–4.5)
Glucose: 91 mg/dL (ref 65–99)
Potassium: 4.4 mmol/L (ref 3.5–5.2)
Sodium: 138 mmol/L (ref 134–144)
TOTAL PROTEIN: 7.3 g/dL (ref 6.0–8.5)

## 2016-05-17 LAB — TSH: TSH: 1.64 u[IU]/mL (ref 0.450–4.500)

## 2016-05-17 NOTE — Telephone Encounter (Signed)
Please contact pt to schedule an appt for recheck of her Effexor with the provider that is going to follow this - Chelle is her PCP but it looks like she has seen many other providers recently.  The appt needs to be within 3 months before her medication runs out.

## 2016-05-18 NOTE — Progress Notes (Signed)
ASCVD score 4.4.

## 2016-08-15 ENCOUNTER — Other Ambulatory Visit: Payer: Self-pay | Admitting: Physician Assistant

## 2016-08-15 DIAGNOSIS — F32A Depression, unspecified: Secondary | ICD-10-CM

## 2016-08-15 DIAGNOSIS — F329 Major depressive disorder, single episode, unspecified: Secondary | ICD-10-CM

## 2016-08-22 ENCOUNTER — Other Ambulatory Visit: Payer: Self-pay | Admitting: Physician Assistant

## 2016-08-23 NOTE — Telephone Encounter (Signed)
Rx authorized. Please advise patient and schedule her for re-evaluation with me.  Meds ordered this encounter  Medications  . venlafaxine XR (EFFEXOR-XR) 75 MG 24 hr capsule    Sig: TAKE 1 CAPSULE (75 MG TOTAL) BY MOUTH DAILY.    Dispense:  90 capsule    Refill:  0

## 2016-09-07 ENCOUNTER — Ambulatory Visit (INDEPENDENT_AMBULATORY_CARE_PROVIDER_SITE_OTHER): Payer: BC Managed Care – PPO | Admitting: Emergency Medicine

## 2016-09-07 ENCOUNTER — Encounter: Payer: Self-pay | Admitting: Emergency Medicine

## 2016-09-07 VITALS — BP 134/83 | HR 104 | Temp 99.2°F | Resp 18 | Ht 65.0 in | Wt 207.8 lb

## 2016-09-07 DIAGNOSIS — F32A Depression, unspecified: Secondary | ICD-10-CM | POA: Insufficient documentation

## 2016-09-07 DIAGNOSIS — F329 Major depressive disorder, single episode, unspecified: Secondary | ICD-10-CM

## 2016-09-07 DIAGNOSIS — K05219 Aggressive periodontitis, localized, unspecified severity: Secondary | ICD-10-CM | POA: Diagnosis not present

## 2016-09-07 MED ORDER — AZITHROMYCIN 250 MG PO TABS: ORAL_TABLET | ORAL | 0 refills | Status: DC

## 2016-09-07 MED ORDER — VENLAFAXINE HCL ER 150 MG PO CP24: 150.0000 mg | ORAL_CAPSULE | Freq: Every day | ORAL | 3 refills | Status: DC

## 2016-09-07 NOTE — Patient Instructions (Addendum)
     IF you received an x-ray today, you will receive an invoice from [facility]. Please contact [facility] at [phone] with questions or concerns regarding your invoice.   IF you received labwork today, you will receive an invoice from [facility]. Please contact [facility] at [phone] with questions or concerns regarding your invoice.   Our billing staff will not be able to assist you with questions regarding bills from these companies.  You will be contacted with the lab results as soon as they are available. The fastest way to get your results is to activate your My Chart account. Instructions are located on the last page of this paperwork. If you have not heard from us regarding the results in 2 weeks, please contact this office.      Dental Abscess A dental abscess is pus in or around a tooth. Follow these instructions at home:  Take medicines only as told by your dentist.  If you were prescribed antibiotic medicine, finish all of it even if you start to feel better.  Rinse your mouth (gargle) often with salt water.  Do not drive or use heavy machinery, like a lawn mower, while taking pain medicine.  Do not apply heat to the outside of your mouth.  Keep all follow-up visits as told by your dentist. This is important. Contact a doctor if:  Your pain is worse, and medicine does not help. Get help right away if:  You have a fever or chills.  Your symptoms suddenly get worse.  You have a very bad headache.  You have problems breathing or swallowing.  You have trouble opening your mouth.  You have puffiness (swelling) in your neck or around your eye. This information is not intended to replace advice given to you by your health care provider. Make sure you discuss any questions you have with your health care provider. Document Released: 08/18/2014 Document Revised: 09/09/2015 Document Reviewed: 03/31/2014 Elsevier Interactive Patient Education   2017 [employer]

## 2016-09-07 NOTE — Progress Notes (Signed)
Shelly [name] 41 y.o.   Chief Complaint  Patient presents with  . Sore Throat    started on right side of throat only x3days   . Fever    temp was 99.2   . Ear Pain    right ear   . Back Pain    right upper side back pain   . Medication Refill    effexor-xr 150mg      HISTORY OF PRESENT ILLNESS: This is a 41 y.o. female complaining of sore throat and pain to right mandibular rea x 2-3 days.  HPI   Prior to Admission medications   Medication Sig Start Date End Date Taking? Authorizing Provider  aspirin (GOODSENSE ASPIRIN) 325 MG tablet Take 325 mg by mouth.   Yes [provider]  cetirizine (ZYRTEC) 10 MG tablet Take 10 mg by mouth daily.   Yes [provider]  fluticasone (FLONASE) 50 MCG/ACT nasal spray Place 2 sprays into both nostrils daily. 01/20/16  Yes McVey, Madelaine Bhat, PA-C  fluticasone furoate-vilanterol (BREO ELLIPTA) 200-25 MCG/INH AEPB Inhale 1 puff into the lungs daily. 03/27/16  Yes Bing Neighbors, FNP  Multiple Vitamin (MULTIVITAMIN) tablet Take 1 tablet by mouth daily.   Yes [provider]  venlafaxine XR (EFFEXOR-XR) 150 MG 24 hr capsule TAKE 1 CAPSULE (150 MG TOTAL) BY MOUTH DAILY WITH BREAKFAST. 08/15/16  Yes Jeffery, Chelle, PA-C  venlafaxine XR (EFFEXOR-XR) 75 MG 24 hr capsule TAKE 1 CAPSULE (75 MG TOTAL) BY MOUTH DAILY. 08/23/16  Yes Jeffery, Chelle, PA-C  albuterol (PROVENTIL HFA;VENTOLIN HFA) 108 (90 Base) MCG/ACT inhaler Inhale into the lungs as needed. 06/05/14   [provider]  meclizine (ANTIVERT) 12.5 MG tablet Take 1-2 tablets (12.5-25 mg total) by mouth 3 (three) times daily as needed for dizziness. Patient not taking: Reported on 09/07/2016 05/16/16   Ofilia Neas, PA-C  promethazine (PHENERGAN) 12.5 MG tablet Take 1-2 tablets (12.5-25 mg total) by mouth every 6 (six) hours as needed for nausea or vomiting. Patient not taking: Reported on 09/07/2016 05/16/16   Ofilia Neas, PA-C    Allergies    Allergen Reactions  . Clindamycin/Lincomycin   . Keflex [Cephalexin]   . Penicillins   . Sulfonamide Derivatives     Patient Active Problem List   Diagnosis Date Noted  . 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 FO (foramen ovale) 09/09/2014  . Smoking addiction 06/05/2014  . Persistent asthma 06/05/2014  . GERD without esophagitis 06/05/2014  . SARCOIDOSIS 04/05/2007  . ARTHRITIS 04/05/2007    Past Medical History:  Diagnosis Date  . Allergy   . Anemia   . Anxiety   . Arthritis   . Depression   . GERD (gastroesophageal reflux disease)   . Migraines   . Miscarriage   . PFO (patent foramen ovale)   . Sarcoidosis   . Stroke ([facility])   . TIA (transient ischemic attack)     Past Surgical History:  Procedure Laterality Date  . BRONCHOSCOPY    . CESAREAN SECTION     miscarriage  . CHOLECYSTECTOMY    . HAND SURGERY    . miscarriage  08/10/14  . NASAL SINUS SURGERY      Social History   Social History  . Marital status: Married    Spouse name: N/A  . Number of children: N/A  . Years of education: N/A   Occupational History  . Not on file.   Social  History Main Topics  . Smoking status: Current Every Day Smoker    Packs/day: 1.00    Years: 26.00    Types: Cigarettes  . Smokeless tobacco: Never Used  . Alcohol use No  . Drug use: No  . Sexual activity: Yes   Other Topics Concern  . Not on file   Social History Narrative  . No narrative on file    Family History  Problem Relation Age of Onset  . Osteoporosis Mother   . Asthma Mother   . Diabetes Father   . Heart disease Father   . Kidney disease Maternal Grandmother   . Heart disease Maternal Grandfather   . Cancer Paternal Grandmother   . Heart disease Paternal Grandfather      Review of Systems  Constitutional: Positive for fever. Negative for chills and malaise/fatigue.  HENT: Positive for ear pain and sore  throat. Negative for congestion and sinus pain.   Eyes: Negative for blurred vision, double vision, discharge and redness.  Respiratory: Negative for cough and shortness of breath.   Cardiovascular: Negative for chest pain, palpitations and leg swelling.  Gastrointestinal: Negative for abdominal pain, diarrhea, nausea and vomiting.  Genitourinary: Negative for dysuria and hematuria.  Musculoskeletal: Positive for back pain.  Skin: Negative for rash.  Neurological: Negative for dizziness and headaches.  Endo/Heme/Allergies: Negative.   All other systems reviewed and are negative.  Vitals:   09/07/16 1730  BP: 134/83  Pulse: (!) 104  Resp: 18  Temp: 99.2 F (37.3 C)     Physical Exam  Constitutional: She is oriented to person, place, and time. She appears well-developed and well-nourished.  HENT:  Head: Normocephalic and atraumatic.  Right Ear: Tympanic membrane, external ear and ear canal normal.  Left Ear: Tympanic membrane, external ear and ear canal normal.  Nose: Nose normal.  Mouth/Throat: Uvula is midline. Posterior oropharyngeal erythema present. No oropharyngeal exudate, posterior oropharyngeal edema or tonsillar abscesses.  Right mandibular swelling  Eyes: Conjunctivae and EOM are normal. Pupils are equal, round, and reactive to light.  Neck: Normal range of motion. Neck supple. No JVD present. No thyromegaly present.  Cardiovascular: Normal rate, regular rhythm and normal heart sounds.   Pulmonary/Chest: Effort normal and breath sounds normal.  Abdominal: Soft. There is no tenderness.  Musculoskeletal: Normal range of motion.  Lymphadenopathy:    She has cervical adenopathy (right side).  Neurological: She is alert and oriented to person, place, and time. No sensory deficit. She exhibits normal muscle tone.  Skin: Skin is warm and dry. Capillary refill takes less than 2 seconds. No rash noted.  Psychiatric: She has a normal mood and affect. Her behavior is normal.   Vitals reviewed.    ASSESSMENT & PLAN: Rainbow was seen today for sore throat, fever, ear pain, back pain and medication refill.  Diagnoses and all orders for this visit:  Acute periodontal abscess  Depression, unspecified depression type -     venlafaxine XR (EFFEXOR-XR) 150 MG 24 hr capsule; Take 1 capsule (150 mg total) by mouth daily with breakfast.  Other orders -     azithromycin (ZITHROMAX) 250 MG tablet; Sig as indicated    Patient Instructions       IF you received an x-ray today, you will receive an invoice from [facility]. Please contact [facility] at [phone] with questions or concerns regarding your invoice.   IF you received labwork today, you will receive an invoice from [facility]. Please contact [facility] at [phone] with questions  or concerns regarding your invoice.   Our billing staff will not be able to assist you with questions regarding bills from these companies.  You will be contacted with the lab results as soon as they are available. The fastest way to get your results is to activate your My Chart account. Instructions are located on the last page of this paperwork. If you have not heard from us regarding the results in 2 weeks, please contact this office.      Dental Abscess A dental abscess is pus in or around a tooth. Follow these instructions at home:  Take medicines only as told by your dentist.  If you were prescribed antibiotic medicine, finish all of it even if you start to feel better.  Rinse your mouth (gargle) often with salt water.  Do not drive or use heavy machinery, like a lawn mower, while taking pain medicine.  Do not apply heat to the outside of your mouth.  Keep all follow-up visits as told by your dentist. This is important. Contact a doctor if:  Your pain is worse, and medicine does not help. Get help right away if:  You have a fever or chills.  Your symptoms suddenly get worse.  You  have a very bad headache.  You have problems breathing or swallowing.  You have trouble opening your mouth.  You have puffiness (swelling) in your neck or around your eye. This information is not intended to replace advice given to you by your health care provider. Make sure you discuss any questions you have with your health care provider. Document Released: 08/18/2014 Document Revised: 09/09/2015 Document Reviewed: 03/31/2014 Elsevier Interactive Patient Education  2017 [employer] Inc.      Edwina BarthMiguel Stephinie Battisti, MD [facility]

## 2016-11-28 ENCOUNTER — Ambulatory Visit (INDEPENDENT_AMBULATORY_CARE_PROVIDER_SITE_OTHER): Payer: BC Managed Care – PPO

## 2016-11-28 ENCOUNTER — Encounter: Payer: Self-pay | Admitting: Family Medicine

## 2016-11-28 ENCOUNTER — Ambulatory Visit (INDEPENDENT_AMBULATORY_CARE_PROVIDER_SITE_OTHER): Payer: BC Managed Care – PPO | Admitting: Family Medicine

## 2016-11-28 VITALS — BP 147/89 | HR 110 | Temp 98.8°F | Resp 16 | Ht 65.0 in | Wt 206.0 lb

## 2016-11-28 DIAGNOSIS — R05 Cough: Secondary | ICD-10-CM

## 2016-11-28 DIAGNOSIS — J069 Acute upper respiratory infection, unspecified: Secondary | ICD-10-CM

## 2016-11-28 DIAGNOSIS — J029 Acute pharyngitis, unspecified: Secondary | ICD-10-CM

## 2016-11-28 DIAGNOSIS — R059 Cough, unspecified: Secondary | ICD-10-CM

## 2016-11-28 LAB — POCT RAPID STREP A (OFFICE): Rapid Strep A Screen: NEGATIVE

## 2016-11-28 NOTE — Patient Instructions (Signed)
     IF you received an x-ray today, you will receive an invoice from [facility]. Please contact [facility] at [phone] with questions or concerns regarding your invoice.   IF you received labwork today, you will receive an invoice from [facility]. Please contact [facility] at [phone] with questions or concerns regarding your invoice.   Our billing staff will not be able to assist you with questions regarding bills from these companies.  You will be contacted with the lab results as soon as they are available. The fastest way to get your results is to activate your My Chart account. Instructions are located on the last page of this paperwork. If you have not heard from us regarding the results in 2 weeks, please contact this office.     

## 2016-11-30 NOTE — Progress Notes (Signed)
8/16/20187:54 AM  Providence Crosby 11-25-1975, 41 y.o. female [id number]  Chief Complaint  Patient presents with  . Sinus Problem    Cough, Sinus pressure, sore throat  . Laryngitis    HPI:   Patient is a 41 y.o. female who presents today for 4 days of sore throat, mild hoarseness, cough, sinus pressure.  She has been taking OTC cold/sinus meds with relief but needs to stay on schedule She denies any purulent nasal drainage. She does not think she has had any fevers She denies any SOB, wheezing, Does smoke, denies any h/o asthma or COPD She does have a remote h/o sarcoidosis, no treatment for past 8 years, she reports minimal lung involvement. 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 Clindamycin/Lincomycin   . Keflex [Cephalexin]   . Penicillins   . Sulfonamide Derivatives     Current Outpatient Prescriptions on File Prior to Visit  Medication Sig Dispense Refill  . albuterol (PROVENTIL HFA;VENTOLIN HFA) 108 (90 Base) MCG/ACT inhaler Inhale into the lungs as needed.    Marland Kitchen aspirin (GOODSENSE ASPIRIN) 325 MG tablet Take 325 mg by mouth.    . cetirizine (ZYRTEC) 10 MG tablet Take 10 mg by mouth daily.    . fluticasone furoate-vilanterol (BREO ELLIPTA) 200-25 MCG/INH AEPB Inhale 1 puff into the lungs daily. 1 each 11  . fluticasone (FLONASE) 50 MCG/ACT nasal spray Place 2 sprays into both nostrils daily. 16 g 12   No current facility-administered  medications on file prior to visit.     Past Medical History:  Diagnosis Date  . Allergy   . Anemia   . Anxiety   . Arthritis   . Depression   . GERD (gastroesophageal reflux disease)   . Migraines   . Miscarriage   . PFO (patent foramen ovale)   . Sarcoidosis   . Stroke (HCC)   . TIA (transient ischemic attack)     Past Surgical History:  Procedure Laterality Date  . BRONCHOSCOPY    . CESAREAN SECTION     miscarriage  . CHOLECYSTECTOMY    . HAND SURGERY    . miscarriage  08/10/14  . NASAL SINUS SURGERY      Social History  Substance Use Topics  . Smoking status: Current Every Day Smoker    Packs/day: 1.00    Years: 26.00    Types: Cigarettes  . Smokeless tobacco: Never Used  . Alcohol use No    Family History  Problem Relation Age of Onset  . Osteoporosis Mother   . Asthma Mother   . Diabetes Father   . Heart disease Father   . Kidney disease Maternal Grandmother   . Heart disease Maternal Grandfather   . Cancer Paternal Grandmother   . Heart disease Paternal Grandfather     Review of Systems  Constitutional: Positive for malaise/fatigue. Negative for chills and fever.  Respiratory: Positive for cough. Negative for hemoptysis, shortness of breath and wheezing.   Cardiovascular: Negative for chest pain, palpitations and leg swelling.  Gastrointestinal: Negative for abdominal pain, nausea and vomiting.    OBJECTIVE:  Vitals:   11/28/16 1528  Weight: 206 lb (93.4 kg)  Height: 5\' 5"  (1.651 m)    Physical Exam  Constitutional: She is oriented to person, place, and time and well-developed, well-nourished, and in no distress.  HENT:  Head: Normocephalic and atraumatic.  Right Ear: Hearing, tympanic membrane, external ear and ear canal normal.  Left Ear: Hearing, tympanic membrane, external ear and ear canal normal.  Mouth/Throat: Oropharynx is clear and moist. No oropharyngeal exudate.  Eyes: Pupils are equal, round, and reactive to light. EOM are  normal.  Neck: Neck supple.  Cardiovascular: Normal rate, regular rhythm and normal heart sounds.  Exam reveals no gallop and no friction rub.   No murmur heard. Pulmonary/Chest: Effort normal and breath sounds normal. She has no wheezes. She has no rales.  Lymphadenopathy:    She has no cervical adenopathy.  Neurological: She is alert and oriented to person, place, and time. Gait normal.  Skin: Skin is warm. She is diaphoretic (clamy).    Results for orders placed or performed in visit on 11/28/16  POCT rapid strep A  Result Value Ref Range   Rapid Strep A Screen Negative Negative     ASSESSMENT and PLAN:  Problem List Items Addressed This Visit    None    Visit Diagnoses    Sore throat    -  Primary   Relevant Orders   POCT rapid strep A (Completed)   Cough       Relevant Orders   DG Chest 2 View (Completed)     1. Sore throat   2. Cough    Discussed with patient negative rapid strep, normal CXR. No indication for abx at this time. Cont with supportive measures. RTC precautions given.  Orders Placed This Encounter  Procedures  . DG Chest 2 View  . POCT rapid strep A   Meds ordered this encounter  Medications  . venlafaxine XR (EFFEXOR-XR) 75 MG 24 hr capsule    Sig: Take 75 mg by mouth.       Myles LippsIrma M Santiago, MD [facility] [address] [place], [place] [address] Ph.  [phone] Fax [phone]

## 2016-12-02 ENCOUNTER — Ambulatory Visit (INDEPENDENT_AMBULATORY_CARE_PROVIDER_SITE_OTHER): Payer: BC Managed Care – PPO | Admitting: Physician Assistant

## 2016-12-02 ENCOUNTER — Encounter: Payer: Self-pay | Admitting: Physician Assistant

## 2016-12-02 VITALS — BP 123/86 | HR 104 | Temp 98.4°F | Resp 16 | Ht 65.0 in | Wt 202.8 lb

## 2016-12-02 DIAGNOSIS — R7303 Prediabetes: Secondary | ICD-10-CM

## 2016-12-02 DIAGNOSIS — R05 Cough: Secondary | ICD-10-CM

## 2016-12-02 DIAGNOSIS — R059 Cough, unspecified: Secondary | ICD-10-CM

## 2016-12-02 LAB — POCT CBC
Granulocyte percent: 72.3 %G (ref 37–80)
HEMATOCRIT: 40.3 % (ref 37.7–47.9)
HEMOGLOBIN: 13.1 g/dL (ref 12.2–16.2)
LYMPH, POC: 2.7 (ref 0.6–3.4)
MCH: 28.4 pg (ref 27–31.2)
MCHC: 32.6 g/dL (ref 31.8–35.4)
MCV: 86.9 fL (ref 80–97)
MID (cbc): 0.3 (ref 0–0.9)
MPV: 7.8 fL (ref 0–99.8)
POC GRANULOCYTE: 7.7 — AB (ref 2–6.9)
POC LYMPH PERCENT: 25.1 %L (ref 10–50)
POC MID %: 2.6 %M (ref 0–12)
Platelet Count, POC: 392 10*3/uL (ref 142–424)
RBC: 4.63 M/uL (ref 4.04–5.48)
RDW, POC: 13.1 %
WBC: 10.7 10*3/uL — AB (ref 4.6–10.2)

## 2016-12-02 LAB — GLUCOSE, POCT (MANUAL RESULT ENTRY): POC GLUCOSE: 119 mg/dL — AB (ref 70–99)

## 2016-12-02 MED ORDER — PREDNISONE 50 MG PO TABS: ORAL_TABLET | ORAL | 0 refills | Status: DC

## 2016-12-02 MED ORDER — IPRATROPIUM BROMIDE 0.02 % IN SOLN
0.5000 mg | Freq: Once | RESPIRATORY_TRACT | Status: AC
  Administered 2016-12-02: 0.5 mg via RESPIRATORY_TRACT

## 2016-12-02 MED ORDER — ALBUTEROL SULFATE (2.5 MG/3ML) 0.083% IN NEBU
2.5000 mg | INHALATION_SOLUTION | Freq: Once | RESPIRATORY_TRACT | Status: AC
  Administered 2016-12-02: 2.5 mg via RESPIRATORY_TRACT

## 2016-12-02 MED ORDER — AZITHROMYCIN 250 MG PO TABS: ORAL_TABLET | ORAL | 0 refills | Status: DC

## 2016-12-02 NOTE — Progress Notes (Signed)
12/04/2016 8:04 AM   DOB: July 11, 1975 / MRN: [id number]  SUBJECTIVE:  Shelly [name] is a 41 y.o. female with history of sarcoidosis diagnosed on biopsy presenting for cough that has been present for about 5 days now. She also reports that she is a current smoker.  Seen 4 days ago and chest rads were negative, symptomatic measures were provided. Has had fever up to 99.8.   She is allergic to clindamycin/lincomycin; keflex [cephalexin]; penicillins; and sulfonamide derivatives.   She  has a past medical history of Allergy; Anemia; Anxiety; Arthritis; Depression; GERD (gastroesophageal reflux disease); Migraines; Miscarriage; PFO (patent foramen ovale); Sarcoidosis; Stroke [facility]); and TIA (transient ischemic attack).    She  reports that she has been smoking Cigarettes.  She has a 26.00 pack-year smoking history. She has never used smokeless tobacco. She reports that she does not drink alcohol or use drugs. She  reports that she currently engages in sexual activity. The patient  has a past surgical history that includes Cholecystectomy; Nasal sinus surgery; Hand surgery; Bronchoscopy; Cesarean section; and miscarriage (08/10/14).  Her family history includes Asthma in her mother; Cancer in her paternal grandmother; Diabetes in her father; Heart disease in her father, maternal grandfather, and paternal grandfather; Kidney disease in her maternal grandmother; Osteoporosis in her mother.  Review of Systems  Constitutional: Negative for chills, diaphoresis and fever.  Respiratory: Positive for cough, shortness of breath and wheezing. Negative for hemoptysis and sputum production.   Gastrointestinal: Negative for nausea.  Skin: Negative for rash.  Neurological: Negative for dizziness.    The problem list and medications were reviewed and updated by myself where necessary and exist elsewhere in the encounter.   OBJECTIVE:  BP 123/86 (BP Location: Right Arm, Patient Position: Sitting, Cuff Size:  Large)   Pulse (!) 104   Temp 98.4 F (36.9 C) (Oral)   Resp 16   Ht 5\' 5"  (1.651 m)   Wt 202 lb 12.8 oz (92 kg)   SpO2 95%   BMI 33.75 kg/m   Pulse Readings from Last 3 Encounters:  12/02/16 (!) 104  11/28/16 (!) 110  09/07/16 (!) 104    Physical Exam  Constitutional:  Non-toxic appearance.  Cardiovascular: Normal rate.   Pulmonary/Chest: Effort normal. No stridor. No respiratory distress. She has no wheezes. She has no rales.  Skin: Skin is warm and dry. She is not diaphoretic. No pallor.    Results for orders placed or performed in visit on 12/02/16 (from the past 72 hour(s))  POCT CBC     Status: Abnormal   Collection Time: 12/02/16  3:35 PM  Result Value Ref Range   WBC 10.7 (A) 4.6 - 10.2 K/uL   Lymph, poc 2.7 0.6 - 3.4   POC LYMPH PERCENT 25.1 10 - 50 %L   MID (cbc) 0.3 0 - 0.9   POC MID % 2.6 0 - 12 %M   POC Granulocyte 7.7 (A) 2 - 6.9   Granulocyte percent 72.3 37 - 80 %G   RBC 4.63 4.04 - 5.48 M/uL   Hemoglobin 13.1 12.2 - 16.2 g/dL   HCT, POC 30.1 60.1 - 47.9 %   MCV 86.9 80 - 97 fL   MCH, POC 28.4 27 - 31.2 pg   MCHC 32.6 31.8 - 35.4 g/dL   RDW, POC 09.3 %   Platelet Count, POC 392 142 - 424 K/uL   MPV 7.8 0 - 99.8 fL  POCT glucose (manual entry)  Status: Abnormal   Collection Time: 12/02/16  3:35 PM  Result Value Ref Range   POC Glucose 119 (A) 70 - 99 mg/dl  Hemoglobin Z6X     Status: Abnormal   Collection Time: 12/02/16  4:02 PM  Result Value Ref Range   Hgb A1c MFr Bld 6.2 (H) 4.8 - 5.6 %    Comment:          Prediabetes: 5.7 - 6.4          Diabetes: >6.4          Glycemic control for adults with diabetes: <7.0    Est. average glucose Bld gHb Est-mCnc 131 mg/dL    No results found.  ASSESSMENT AND PLAN:  Jermaine was seen today for cough and shortness of breath.  Diagnoses and all orders for this visit:  Cough: Mildly decreased peak flow pre neb at 375 with symptomatic improvement post neb.  Given his tory of smoking, early  leukocytosis and day 7 of illness, and history of sarcoid will treat with pred and azithro.  RTC if a few days if not improving.  -     POCT CBC -     albuterol (PROVENTIL) (2.5 MG/3ML) 0.083% nebulizer solution 2.5 mg; Take 3 mLs (2.5 mg total) by nebulization once. -     ipratropium (ATROVENT) nebulizer solution 0.5 mg; Take 2.5 mLs (0.5 mg total) by nebulization once. -     POCT glucose (manual entry) -     azithromycin (ZITHROMAX) 250 MG tablet; Take two tabs on day one and one daily thereafter. -     Hemoglobin A1c -     predniSONE (DELTASONE) 50 MG tablet; Take one tab for three days in the morning only.  Prediabetes    The patient is advised to call or return to clinic if she does not see an improvement in symptoms, or to seek the care of the closest emergency department if she worsens with the above plan.   Deliah [place], MHS, PA-C Primary Care at [facility] 12/04/2016 8:04 AM

## 2016-12-03 LAB — HEMOGLOBIN A1C
ESTIMATED AVERAGE GLUCOSE: 131 mg/dL
HEMOGLOBIN A1C: 6.2 % — AB (ref 4.8–5.6)

## 2016-12-20 ENCOUNTER — Telehealth: Payer: Self-pay | Admitting: Physician Assistant

## 2016-12-20 NOTE — Telephone Encounter (Signed)
Pt is calling to check on lab results.  From what I could gather from the pt, Shelly [name] is concerned with her sugar levels and diabetes.  Since her last visit, he was supposed to call and advise on the next steps of her treatment.  I was unsure if she needed another O/V since there were no notes put in for her to follow up (only F/U if SOB worsened or did not improve from last O/V).  Please advise

## 2017-01-01 NOTE — Telephone Encounter (Signed)
I have prescribed pred and abx.  She would need to come back to consider the next best step. Deliah [place], MS, PA-C 2:30 PM, 01/01/2017

## 2017-01-01 NOTE — Telephone Encounter (Signed)
Please advise 

## 2017-02-14 ENCOUNTER — Encounter: Payer: Self-pay | Admitting: Physician Assistant

## 2017-02-14 ENCOUNTER — Ambulatory Visit (INDEPENDENT_AMBULATORY_CARE_PROVIDER_SITE_OTHER): Payer: BC Managed Care – PPO | Admitting: Family Medicine

## 2017-02-14 VITALS — BP 130/80 | HR 115 | Temp 98.9°F | Resp 18 | Ht 65.0 in | Wt 202.6 lb

## 2017-02-14 DIAGNOSIS — J029 Acute pharyngitis, unspecified: Secondary | ICD-10-CM | POA: Diagnosis not present

## 2017-02-14 DIAGNOSIS — J0100 Acute maxillary sinusitis, unspecified: Secondary | ICD-10-CM | POA: Diagnosis not present

## 2017-02-14 DIAGNOSIS — M7989 Other specified soft tissue disorders: Secondary | ICD-10-CM | POA: Diagnosis not present

## 2017-02-14 DIAGNOSIS — J069 Acute upper respiratory infection, unspecified: Secondary | ICD-10-CM

## 2017-02-14 DIAGNOSIS — R062 Wheezing: Secondary | ICD-10-CM

## 2017-02-14 MED ORDER — AZITHROMYCIN 250 MG PO TABS: ORAL_TABLET | ORAL | 1 refills | Status: DC

## 2017-02-14 MED ORDER — PREDNISONE 50 MG PO TABS: ORAL_TABLET | ORAL | 0 refills | Status: DC

## 2017-02-14 NOTE — Progress Notes (Signed)
Subjective:    Patient ID: Shelly [name], female    DOB: 10-18-1975, 41 y.o.   MRN: [id number]  HPI  Shelly [name] is a 41 year old Caucasian female with a past medical history significant for smoking addiction and sarcoidosis who presents today with sinus pressure and foot swelling. Shelly [name] states she has a history of sinus infections. She felt off this weekend and then started to feel lightheaded on Monday. On Tuesday she began experiencing sinus pain above and below bilateral eyes. Shelly [name] states she has mild nasal congestion which is pretty constant for her. She reports she developed a sore throat last night. Shelly [name] she has had low-grade fevers since Saturday (02/10/17). She denies eye drainage or discharge. She had some right sided ear pain for the past 2 days, but denies ear drainage.  Shelly [name] states she has had a grinding cough. She endorses slight chest aching in her upper chest and her upper back aches as well. She also reports her voice is hoarse and she lost her voice at 12pm today. Shelly [name] continues to use her Virgel Bouquet, but has not used Albuterol with onset of this illness. She continues to use her Zyrtec and Flonase.   Shelly [name] states her son was having stomach pain early last week, but she has no known sick contacts with similar symptoms.   Shelly [name] states she woke up at 4am this morning and her feet were swollen. She reports she could not walk and when she flexed her feet it felt like her feet were going to crack. She wore heels yesterday, but has never experienced this before. The swelling went down after 2-3 hours, and now her feet are back to normal. Her right foot is still a little swollen, but her left is back to normal.   Medications:  Prior to Admission medications   Medication Sig Start Date End Date Taking? Authorizing Provider  albuterol (PROVENTIL HFA;VENTOLIN HFA) 108 (90 Base) MCG/ACT inhaler Inhale into the lungs as needed. 06/05/14  Yes [provider]  aspirin (GOODSENSE ASPIRIN) 325 MG tablet Take 325 mg by mouth.   Yes [provider]  cetirizine (ZYRTEC) 10 MG tablet Take 10 mg by mouth daily.   Yes [provider]  fluticasone (FLONASE) 50 MCG/ACT nasal spray Place 2 sprays into both nostrils daily. 01/20/16  Yes McVey, Madelaine Bhat, PA-C  fluticasone furoate-vilanterol (BREO ELLIPTA) 200-25 MCG/INH AEPB Inhale 1 puff into the lungs daily. 03/27/16  Yes Bing Neighbors, FNP  venlafaxine XR (EFFEXOR-XR) 150 MG 24 hr capsule Take 150 mg by mouth daily with breakfast.   Yes [provider]  venlafaxine XR (EFFEXOR-XR) 75 MG 24 hr capsule Take 75 mg by mouth. 08/22/16  Yes [provider]  azithromycin (ZITHROMAX) 250 MG tablet Take two tabs on day one and one daily thereafter. Patient not taking: Reported on 02/14/2017 12/02/16   Ofilia Neas, PA-C  predniSONE (DELTASONE) 50 MG tablet Take one tab for three days in the morning only. Patient not taking: Reported on 02/14/2017 12/02/16   Ofilia Neas, PA-C   Allergies:  Allergies  Allergen Reactions  . Clindamycin/Lincomycin   . Keflex [Cephalexin]   . Penicillins   . Sulfonamide Derivatives    Chronic Medical Conditions:  Patient Active Problem List   Diagnosis Date Noted  . Acute periodontal abscess 09/07/2016  . Depression 09/07/2016  . Personal history of transient ischemic attack (TIA), and cerebral infarction without residual deficits  09/09/2014  . FO (foramen ovale) 09/09/2014  . Smoking addiction 06/05/2014  . Persistent asthma 06/05/2014  . GERD without esophagitis 06/05/2014  . SARCOIDOSIS 04/05/2007  . ARTHRITIS 04/05/2007    Review of Systems     Objective:   Physical Exam  Constitutional: She appears well-developed and well-nourished. She is active and cooperative. No distress.  BP 130/80 (BP Location: Left Arm, Patient Position: Sitting, Cuff Size: Large)   Pulse (!) 115   Temp 98.9 F (37.2 C)  (Oral)   Resp 18   Ht 5' 5"  (1.651 m)   Wt 202 lb 9.6 oz (91.9 kg)   LMP 01/24/2017   SpO2 97%   BMI 33.71 kg/m    HENT:  Head: Normocephalic and atraumatic.  Right Ear: Ear canal normal. Tympanic membrane is erythematous. Tympanic membrane is not bulging. No middle ear effusion.  Left Ear: Ear canal normal. There is tenderness. Tympanic membrane is erythematous. Tympanic membrane is not bulging.  No middle ear effusion.  Mouth/Throat: No oropharyngeal exudate or posterior oropharyngeal erythema.    Eyes: Pupils are equal, round, and reactive to light. Conjunctivae are normal. Right eye exhibits no discharge. Left eye exhibits no discharge.  Neck: Normal range of motion. Neck supple. No thyromegaly present.  Cardiovascular: Normal rate, regular rhythm, normal heart sounds and intact distal pulses.   No murmur heard. Pulses:      Radial pulses are 2+ on the right side, and 2+ on the left side.       Dorsalis pedis pulses are 2+ on the right side, and 2+ on the left side.       Posterior tibial pulses are 2+ on the right side, and 2+ on the left side.  Pulmonary/Chest: Effort normal and breath sounds normal. She has no wheezes.  Musculoskeletal: Normal range of motion. She exhibits no edema, tenderness or deformity.       Right ankle: She exhibits normal range of motion, no swelling, no deformity and normal pulse. No tenderness.       Left ankle: She exhibits normal range of motion, no swelling, no deformity and normal pulse. No tenderness.  Lymphadenopathy:    She has no cervical adenopathy.  Neurological: She is alert.  Skin: Skin is warm and dry. She is not diaphoretic.      Assessment & Plan:  1. Viral URI with cough - Begin Azelestine 0.1% spray  - Begin Tessalon pearls  - Recommended patient re-starts Albuterol 2 puffs every 4 hours as needed for cough - Instructed patient to return to clinic with worsening symptoms or if symptoms do not improve.   Judie Petitaylin Waynesha Rammel, PA-S

## 2017-02-14 NOTE — Progress Notes (Signed)
Chief Complaint  Patient presents with  . Sinus Problem    pt states she started not feeling well this past weekend but most of the sinus pressure started this morning. Pt states hoarseness started mid day today. Pt states she has been taking Advil cold and sinus since this morning.  . Foot Swelling    pt states this morning around 4am she noticed that her feet were swollen that she couldn't walk.    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   Swollen feet She reports that this morning both feet were swollen at 4am She states that they were so swollen that the creases in her feet were all gone She states that they looked like sausages She states that her ankles were not swollen She had pain with standing due to the swelling She went back to sleep and they resolved by 7:30am   4 review of systems  Past Medical History:  Diagnosis Date  . Allergy   . Anemia   . Anxiety   . Arthritis   . Depression   . GERD (gastroesophageal reflux disease)   . Migraines   . Miscarriage   . PFO (patent foramen ovale)   . Sarcoidosis   . Stroke (HCC)   . TIA (transient ischemic attack)     Current Outpatient Prescriptions  Medication Sig Dispense Refill  . albuterol (PROVENTIL HFA;VENTOLIN HFA) 108 (90 Base) MCG/ACT inhaler Inhale into the lungs as needed.    Marland Kitchen. aspirin (GOODSENSE ASPIRIN) 325 MG tablet Take 325 mg by mouth.     . cetirizine (ZYRTEC) 10 MG tablet Take 10 mg by mouth daily.    . fluticasone (FLONASE) 50 MCG/ACT nasal spray Place 2 sprays into both nostrils daily. 16 g 12  . fluticasone furoate-vilanterol (BREO ELLIPTA) 200-25 MCG/INH AEPB Inhale 1 puff into the lungs daily. 1 each 11  . venlafaxine XR (EFFEXOR-XR) 150 MG 24 hr capsule Take 150 mg by mouth daily with breakfast.    . venlafaxine XR (EFFEXOR-XR) 75 MG 24 hr capsule Take 75 mg by mouth.    Marland Kitchen. azithromycin (ZITHROMAX) 250 MG tablet Take two tabs on day one and one daily thereafter. Repeat course 6 tablet 1  . predniSONE (DELTASONE) 50 MG tablet Take one tab for three days in the morning only. 3 tablet 0   No current facility-administered medications for this visit.     Allergies:  Allergies  Allergen Reactions  . Clindamycin/Lincomycin   . Keflex [Cephalexin]   . Penicillins   . Sulfonamide Derivatives     Past Surgical History:  Procedure Laterality Date  . BRONCHOSCOPY    . CESAREAN SECTION     miscarriage  . CHOLECYSTECTOMY    . HAND SURGERY    . miscarriage  08/10/14  . NASAL SINUS SURGERY      Social History   Social History  .  Marital status: Married    Spouse name: N/A  . Number of children: N/A  . Years of education: N/A   Social History Main Topics  . Smoking status: Current Every Day Smoker    Packs/day: 1.00    Years: 26.00    Types: Cigarettes  . Smokeless tobacco: Never Used  . Alcohol use No  . Drug use: No  . Sexual activity: Yes   Other Topics Concern  . None   Social History Narrative  . None    Family History  Problem Relation Age of Onset  . Osteoporosis Mother   . Asthma Mother   . Diabetes Father   . Heart disease Father   . Kidney disease Maternal Grandmother   . Heart disease Maternal Grandfather   . Cancer Paternal Grandmother   . Heart disease Paternal Grandfather      ROS Review of Systems See HPI Constitution: No fevers or chills No malaise No  diaphoresis Skin: No rash or itching Eyes: no blurry vision, no double vision GU: no dysuria or hematuria Neuro: no dizziness or headaches    Objective: Vitals:   02/14/17 1514  BP: 130/80  Pulse: (!) 115  Resp: 18  Temp: 98.9 F (37.2 C)  TempSrc: Oral  SpO2: 97%  Weight: 202 lb 9.6 oz (91.9 kg)  Height: 5\' 5"  (1.651 m)    Physical Exam General: alert, oriented, in NAD Head: normocephalic, atraumatic, no sinus tenderness Eyes: EOM intact, no scleral icterus or conjunctival injection Ears: TM clear bilaterally Nose: mucosa nonerythematous, nonedematous Throat: no pharyngeal exudate or erythema Lymph: no posterior auricular, submental or cervical lymph adenopathy Heart: normal rate, normal sinus rhythm, no murmurs Lungs: clear to auscultation bilaterally, no wheezing   Assessment and Plan Wladyslawa was seen today for sinus problem and foot swelling.  Diagnoses and all orders for this visit:  Sore throat  URI with cough and congestion -     predniSONE (DELTASONE) 50 MG tablet; Take one tab for three days in the morning only. -     azithromycin (ZITHROMAX) 250 MG tablet; Take two tabs on day one and one daily thereafter. Repeat course  Acute non-recurrent maxillary sinusitis -     predniSONE (DELTASONE) 50 MG tablet; Take one tab for three days in the morning only. -     azithromycin (ZITHROMAX) 250 MG tablet; Take two tabs on day one and one daily thereafter. Repeat course  Cough  Wheezing -     predniSONE (DELTASONE) 50 MG tablet; Take one tab for three days in the morning only.  Steroid burst for wheezing and sinuitis as well as [id number] since pt can't do augmentin Follow up if symptoms persist  Foot swelling completed resolved so not work up needed    [employer]

## 2017-02-14 NOTE — Patient Instructions (Addendum)
     IF you received an x-ray today, you will receive an invoice from [facility]. Please contact [facility] at [phone] with questions or concerns regarding your invoice.   IF you received labwork today, you will receive an invoice from [facility]. Please contact [facility] at [phone] with questions or concerns regarding your invoice.   Our billing staff will not be able to assist you with questions regarding bills from these companies.  You will be contacted with the lab results as soon as they are available. The fastest way to get your results is to activate your My Chart account. Instructions are located on the last page of this paperwork. If you have not heard from us regarding the results in 2 weeks, please contact this office.     Sore Throat A sore throat is pain, burning, irritation, or scratchiness in the throat. When you have a sore throat, you may feel pain or tenderness in your throat when you swallow or talk. Many things can cause a sore throat, including:  An infection.  Seasonal allergies.  Dryness in the air.  Irritants, such as smoke or pollution.  Gastroesophageal reflux disease (GERD).  A tumor.  A sore throat is often the first sign of another sickness. It may happen with other symptoms, such as coughing, sneezing, fever, and swollen neck glands. Most sore throats go away without medical treatment. Follow these instructions at home:  Take over-the-counter medicines only as told by your health care provider.  Drink enough fluids to keep your urine clear or pale yellow.  Rest as needed.  To help with pain, try: ? Sipping warm liquids, such as broth, herbal tea, or warm water. ? Eating or drinking cold or frozen liquids, such as frozen ice pops. ? Gargling with a salt-water mixture 3-4 times a day or as needed. To make a salt-water mixture, completely dissolve -1 tsp of salt in 1 cup of warm water. ? Sucking on hard candy or throat  lozenges. ? Putting a cool-mist humidifier in your bedroom at night to moisten the air. ? Sitting in the bathroom with the door closed for 5-10 minutes while you run hot water in the shower.  Do not use any tobacco products, such as cigarettes, chewing tobacco, and e-cigarettes. If you need help quitting, ask your health care provider. Contact a health care provider if:  You have a fever for more than 2-3 days.  You have symptoms that last (are persistent) for more than 2-3 days.  Your throat does not get better within 7 days.  You have a fever and your symptoms suddenly get worse. Get help right away if:  You have difficulty breathing.  You cannot swallow fluids, soft foods, or your saliva.  You have increased swelling in your throat or neck.  You have persistent nausea and vomiting. This information is not intended to replace advice given to you by your health care provider. Make sure you discuss any questions you have with your health care provider. Document Released: 05/11/2004 Document Revised: 11/28/2015 Document Reviewed: 01/22/2015 Elsevier Interactive Patient Education  2018 Elsevier Inc.  

## 2017-04-19 ENCOUNTER — Other Ambulatory Visit: Payer: Self-pay | Admitting: Family Medicine

## 2017-06-23 ENCOUNTER — Ambulatory Visit: Payer: BC Managed Care – PPO | Admitting: Family Medicine

## 2017-06-23 ENCOUNTER — Encounter: Payer: Self-pay | Admitting: Family Medicine

## 2017-06-23 VITALS — BP 110/64 | HR 100 | Temp 98.4°F | Resp 16 | Ht 64.0 in | Wt 191.8 lb

## 2017-06-23 DIAGNOSIS — J029 Acute pharyngitis, unspecified: Secondary | ICD-10-CM

## 2017-06-23 DIAGNOSIS — J069 Acute upper respiratory infection, unspecified: Secondary | ICD-10-CM

## 2017-06-23 DIAGNOSIS — J040 Acute laryngitis: Secondary | ICD-10-CM

## 2017-06-23 LAB — POCT RAPID STREP A (OFFICE): RAPID STREP A SCREEN: NEGATIVE

## 2017-06-23 NOTE — Patient Instructions (Addendum)
Currently her symptoms appear to be due to a virus. Saline nasal spray, Mucinex as needed for cough. Voice rest is important. Make sure to drink plenty of fluids. See other information below. If wheezing/asthma symptoms are worse, or you require albuterol inhaler more than a few times per day, or more than a few days in a row, please return for recheck. Return to the clinic or go to the nearest emergency room if any of your symptoms worsen or new symptoms occur. Let me know if you need a note next week for work.   Laryngitis Laryngitis is inflammation of your vocal cords. This causes hoarseness, coughing, loss of voice, sore throat, or a dry throat. Your vocal cords are two bands of muscles that are found in your throat. When you speak, these cords come together and vibrate. These vibrations come out through your mouth as sound. When your vocal cords are inflamed, your voice sounds different. Laryngitis can be temporary (acute) or long-term (chronic). Most cases of acute laryngitis improve with time. Chronic laryngitis is laryngitis that lasts for more than three weeks. What are the causes? Acute laryngitis may be caused by:  A viral infection.  Lots of talking, yelling, or singing. This is also called vocal strain.  Bacterial infections.  Chronic laryngitis may be caused by:  Vocal strain.  Injury to your vocal cords.  Acid reflux (gastroesophageal reflux disease or GERD).  Allergies.  Sinus infection.  Smoking.  Alcohol abuse.  Breathing in chemicals or dust.  Growths on the vocal cords.  What increases the risk? Risk factors for laryngitis include:  Smoking.  Alcohol abuse.  Having allergies.  What are the signs or symptoms? Symptoms of laryngitis may include:  Low, hoarse voice.  Loss of voice.  Dry cough.  Sore throat.  Stuffy nose.  How is this diagnosed? Laryngitis may be diagnosed by:  Physical exam.  Throat culture.  Blood  test.  Laryngoscopy. This procedure allows your health care provider to look at your vocal cords with a mirror or viewing tube.  How is this treated? Treatment for laryngitis depends on what is causing it. Usually, treatment involves resting your voice and using medicines to soothe your throat. However, if your laryngitis is caused by a bacterial infection, you may need to take antibiotic medicine. If your laryngitis is caused by a growth, you may need to have a procedure to remove it. Follow these instructions at home:  Drink enough fluid to keep your urine clear or pale yellow.  Breathe in moist air. Use a humidifier if you live in a dry climate.  Take medicines only as directed by your health care provider.  If you were prescribed an antibiotic medicine, finish it all even if you start to feel better.  Do not smoke cigarettes or electronic cigarettes. If you need help quitting, ask your health care provider.  Talk as little as possible. Also avoid whispering, which can cause vocal strain.  Write instead of talking. Do this until your voice is back to normal. Contact a health care provider if:  You have a fever.  You have increasing pain.  You have difficulty swallowing. Get help right away if:  You cough up blood.  You have trouble breathing. This information is not intended to replace advice given to you by your health care provider. Make sure you discuss any questions you have with your health care provider. Document Released: 04/03/2005 Document Revised: 09/09/2015 Document Reviewed: 09/16/2013 Elsevier Interactive Patient Education  2018 Elsevier Inc.  Upper Respiratory Infection, Adult Most upper respiratory infections (URIs) are caused by a virus. A URI affects the nose, throat, and upper air passages. The most common type of URI is often called "the common cold." Follow these instructions at home:  Take medicines only as told by your doctor.  Gargle warm  saltwater or take cough drops to comfort your throat as told by your doctor.  Use a warm mist humidifier or inhale steam from a shower to increase air moisture. This may make it easier to breathe.  Drink enough fluid to keep your pee (urine) clear or pale yellow.  Eat soups and other clear broths.  Have a healthy diet.  Rest as needed.  Go back to work when your fever is gone or your doctor says it is okay. ? You may need to stay home longer to avoid giving your URI to others. ? You can also wear a face mask and wash your hands often to prevent spread of the virus.  Use your inhaler more if you have asthma.  Do not use any tobacco products, including cigarettes, chewing tobacco, or electronic cigarettes. If you need help quitting, ask your doctor. Contact a doctor if:  You are getting worse, not better.  Your symptoms are not helped by medicine.  You have chills.  You are getting more short of breath.  You have brown or red mucus.  You have yellow or brown discharge from your nose.  You have pain in your face, especially when you bend forward.  You have a fever.  You have puffy (swollen) neck glands.  You have pain while swallowing.  You have white areas in the back of your throat. Get help right away if:  You have very bad or constant: ? Headache. ? Ear pain. ? Pain in your forehead, behind your eyes, and over your cheekbones (sinus pain). ? Chest pain.  You have long-lasting (chronic) lung disease and any of the following: ? Wheezing. ? Long-lasting cough. ? Coughing up blood. ? A change in your usual mucus.  You have a stiff neck.  You have changes in your: ? Vision. ? Hearing. ? Thinking. ? Mood. This information is not intended to replace advice given to you by your health care provider. Make sure you discuss any questions you have with your health care provider. Document Released: 09/20/2007 Document Revised: 12/05/2015 Document Reviewed:  07/09/2013 Elsevier Interactive Patient Education  2018 [employer].     IF you received an x-ray today, you will receive an invoice from [facility]. Please contact [facility] at [phone] with questions or concerns regarding your invoice.   IF you received labwork today, you will receive an invoice from [facility]. Please contact [facility] at [phone] with questions or concerns regarding your invoice.   Our billing staff will not be able to assist you with questions regarding bills from these companies.  You will be contacted with the lab results as soon as they are available. The fastest way to get your results is to activate your My Chart account. Instructions are located on the last page of this paperwork. If you have not heard from [place] regarding the results in 2 weeks, please contact this office.

## 2017-06-23 NOTE — Progress Notes (Signed)
Subjective:    Patient ID: Shelly [name], female    DOB: January 02, 1976, 41 y.o.   MRN: [id number]  HPI Shelly [name] is a 42 y.o. female Presents today for: Chief Complaint  Patient presents with  . Laryngitis    cough, sob, ST   Started with scratchy throat 2 nights ago, noticed "tonsil stones". Started to lose voice yesterday. Dry throat yesterday. Not having pain in throat. Drinking fluids ok. Chest burning with some cough. Thick mucus.   Tx: none.   Hx of asthma, on Breo, and flonase and zyrtec for allergies. Not wheezing with current illness.   Sick contacts.  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   Patient Active Problem List   Diagnosis Date Noted  . Acute periodontal abscess 09/07/2016  . Depression 09/07/2016  . 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 FO (foramen ovale) 09/09/2014  . Smoking addiction 06/05/2014  . Persistent asthma 06/05/2014  . GERD without esophagitis 06/05/2014  . SARCOIDOSIS 04/05/2007  . ARTHRITIS 04/05/2007   Past Medical History:  Diagnosis Date  . Allergy   . Anemia   . Anxiety   . Arthritis   . Depression   . GERD (gastroesophageal reflux disease)   . Migraines   . Miscarriage   . PFO (patent foramen ovale)   . Sarcoidosis   . Stroke (HCC)   . TIA (transient ischemic attack)    Past Surgical History:  Procedure Laterality Date  . BRONCHOSCOPY    . CESAREAN SECTION     miscarriage  . CHOLECYSTECTOMY    . HAND SURGERY    . miscarriage  08/10/14  . NASAL SINUS SURGERY     Allergies  Allergen Reactions  . Clindamycin/Lincomycin   . Keflex [Cephalexin]   . Penicillins   . Sulfonamide Derivatives    Prior to Admission medications   Medication Sig Start Date End Date Taking? Authorizing Provider  albuterol (PROVENTIL HFA;VENTOLIN HFA) 108 (90 Base) MCG/ACT inhaler Inhale into the lungs as needed. 06/05/14  Yes [provider]    ALPRAZolam Prudy Feeler) 0.5 MG tablet Take by mouth. 06/19/17 07/19/17 Yes [provider]  aspirin (GOODSENSE ASPIRIN) 325 MG tablet Take 325 mg by mouth.   Yes [provider]  cetirizine (ZYRTEC) 10 MG tablet Take 10 mg by mouth daily.   Yes [provider]  fluticasone (FLONASE) 50 MCG/ACT nasal spray Place 2 sprays into both nostrils daily. 01/20/16  Yes McVey, Madelaine Bhat, PA-C  fluticasone furoate-vilanterol (BREO ELLIPTA) 200-25 MCG/INH AEPB Inhale 1 puff into the lungs daily. 03/27/16  Yes Bing Neighbors, FNP  venlafaxine XR (EFFEXOR-XR) 150 MG 24 hr capsule Take 150 mg by mouth daily with breakfast.   Yes [provider]  venlafaxine XR (EFFEXOR-XR) 75 MG 24 hr capsule Take 75 mg by mouth. 08/22/16  Yes [provider]   Social History   Socioeconomic History  . Marital status: Married    Spouse name: Not on file  . Number of children: Not on file  . Years of education: Not on file  . Highest education level: Not on file  Social Needs  . Financial resource strain: Not on file  . Food insecurity - worry: Not on file  . Food insecurity - inability: Not on file  . Transportation needs - medical: Not on file  . Transportation needs - non-medical: Not on file  Occupational History  . Not on  file  Tobacco Use  . Smoking status: Current Every Day Smoker    Packs/day: 1.00    Years: 26.00    Pack years: 26.00    Types: Cigarettes  . Smokeless tobacco: Never Used  Substance and Sexual Activity  . Alcohol use: No    Alcohol/week: 0.0 oz  . Drug use: No  . Sexual activity: Yes  Other Topics Concern  . Not on file  Social History Narrative  . Not on file    Review of Systems  Constitutional: Negative for chills and fever.  HENT: Positive for congestion, rhinorrhea and voice change. Negative for sinus pain, sore throat (Slight discomfort, denies pain) and trouble swallowing.   Respiratory: Positive for cough. Negative for  wheezing.       Objective:   Physical Exam  Constitutional: She is oriented to person, place, and time. She appears well-developed and well-nourished. No distress.  HENT:  Head: Normocephalic and atraumatic.  Right Ear: Hearing, tympanic membrane, external ear and ear canal normal.  Left Ear: Hearing, tympanic membrane, external ear and ear canal normal.  Nose: Nose normal.  Mouth/Throat: Oropharynx is clear and moist. No oropharyngeal exudate.  Sinuses nontender, few tonsilliths without exudate on left. No uvula shift.  Eyes: Conjunctivae and EOM are normal. Pupils are equal, round, and reactive to light.  Cardiovascular: Normal rate, regular rhythm, normal heart sounds and intact distal pulses.  No murmur heard. Pulmonary/Chest: Effort normal and breath sounds normal. No respiratory distress. She has no wheezes. She has no rhonchi.  Neurological: She is alert and oriented to person, place, and time.  Skin: Skin is warm and dry. No rash noted.  Psychiatric: She has a normal mood and affect. Her behavior is normal.  Vitals reviewed.  Vitals:   06/23/17 1606  BP: 110/64  Pulse: 100  Resp: 16  Temp: 98.4 F (36.9 C)  TempSrc: Oral  SpO2: 98%  Weight: 191 lb 12.8 oz (87 kg)  Height: 5\' 4"  (1.626 m)   Results for orders placed or performed in visit on 06/23/17  POCT rapid strep A  Result Value Ref Range   Rapid Strep A Screen Negative Negative       Assessment & Plan:   Shelly [name] is a 42 y.o. female Sore throat - Plan: POCT rapid strep A, Culture, Group A Strep Suspected viral URI, with laryngitis.   - symptomatic care, voice rest, asthma controlled, albuterol if needed with RTC precautions.   No orders of the defined types were placed in this encounter.  Patient Instructions    Currently her symptoms appear to be due to a virus. Saline nasal spray, Mucinex as needed for cough. Voice rest is important. Make sure to drink plenty of fluids. See other information  below. If wheezing/asthma symptoms are worse, or you require albuterol inhaler more than a few times per day, or more than a few days in a row, please return for recheck. Return to the clinic or go to the nearest emergency room if any of your symptoms worsen or new symptoms occur. Let me know if you need a note next week for work.   Laryngitis Laryngitis is inflammation of your vocal cords. This causes hoarseness, coughing, loss of voice, sore throat, or a dry throat. Your vocal cords are two bands of muscles that are found in your throat. When you speak, these cords come together and vibrate. These vibrations come out through your mouth as sound. When your vocal cords are inflamed,  your voice sounds different. Laryngitis can be temporary (acute) or long-term (chronic). Most cases of acute laryngitis improve with time. Chronic laryngitis is laryngitis that lasts for more than three weeks. What are the causes? Acute laryngitis may be caused by:  A viral infection.  Lots of talking, yelling, or singing. This is also called vocal strain.  Bacterial infections.  Chronic laryngitis may be caused by:  Vocal strain.  Injury to your vocal cords.  Acid reflux (gastroesophageal reflux disease or GERD).  Allergies.  Sinus infection.  Smoking.  Alcohol abuse.  Breathing in chemicals or dust.  Growths on the vocal cords.  What increases the risk? Risk factors for laryngitis include:  Smoking.  Alcohol abuse.  Having allergies.  What are the signs or symptoms? Symptoms of laryngitis may include:  Low, hoarse voice.  Loss of voice.  Dry cough.  Sore throat.  Stuffy nose.  How is this diagnosed? Laryngitis may be diagnosed by:  Physical exam.  Throat culture.  Blood test.  Laryngoscopy. This procedure allows your health care provider to look at your vocal cords with a mirror or viewing tube.  How is this treated? Treatment for laryngitis depends on what is  causing it. Usually, treatment involves resting your voice and using medicines to soothe your throat. However, if your laryngitis is caused by a bacterial infection, you may need to take antibiotic medicine. If your laryngitis is caused by a growth, you may need to have a procedure to remove it. Follow these instructions at home:  Drink enough fluid to keep your urine clear or pale yellow.  Breathe in moist air. Use a humidifier if you live in a dry climate.  Take medicines only as directed by your health care provider.  If you were prescribed an antibiotic medicine, finish it all even if you start to feel better.  Do not smoke cigarettes or electronic cigarettes. If you need help quitting, ask your health care provider.  Talk as little as possible. Also avoid whispering, which can cause vocal strain.  Write instead of talking. Do this until your voice is back to normal. Contact a health care provider if:  You have a fever.  You have increasing pain.  You have difficulty swallowing. Get help right away if:  You cough up blood.  You have trouble breathing. This information is not intended to replace advice given to you by your health care provider. Make sure you discuss any questions you have with your health care provider. Document Released: 04/03/2005 Document Revised: 09/09/2015 Document Reviewed: 09/16/2013 Elsevier Interactive Patient Education  2018 Elsevier Inc.  Upper Respiratory Infection, Adult Most upper respiratory infections (URIs) are caused by a virus. A URI affects the nose, throat, and upper air passages. The most common type of URI is often called "the common cold." Follow these instructions at home:  Take medicines only as told by your doctor.  Gargle warm saltwater or take cough drops to comfort your throat as told by your doctor.  Use a warm mist humidifier or inhale steam from a shower to increase air moisture. This may make it easier to breathe.  Drink  enough fluid to keep your pee (urine) clear or pale yellow.  Eat soups and other clear broths.  Have a healthy diet.  Rest as needed.  Go back to work when your fever is gone or your doctor says it is okay. ? You may need to stay home longer to avoid giving your URI to others. ?  You can also wear a face mask and wash your hands often to prevent spread of the virus.  Use your inhaler more if you have asthma.  Do not use any tobacco products, including cigarettes, chewing tobacco, or electronic cigarettes. If you need help quitting, ask your doctor. Contact a doctor if:  You are getting worse, not better.  Your symptoms are not helped by medicine.  You have chills.  You are getting more short of breath.  You have brown or red mucus.  You have yellow or brown discharge from your nose.  You have pain in your face, especially when you bend forward.  You have a fever.  You have puffy (swollen) neck glands.  You have pain while swallowing.  You have white areas in the back of your throat. Get help right away if:  You have very bad or constant: ? Headache. ? Ear pain. ? Pain in your forehead, behind your eyes, and over your cheekbones (sinus pain). ? Chest pain.  You have long-lasting (chronic) lung disease and any of the following: ? Wheezing. ? Long-lasting cough. ? Coughing up blood. ? A change in your usual mucus.  You have a stiff neck.  You have changes in your: ? Vision. ? Hearing. ? Thinking. ? Mood. This information is not intended to replace advice given to you by your health care provider. Make sure you discuss any questions you have with your health care provider. Document Released: 09/20/2007 Document Revised: 12/05/2015 Document Reviewed: 07/09/2013 Elsevier Interactive Patient Education  2018 [employer].     IF you received an x-ray today, you will receive an invoice from [facility]. Please contact [facility] at  [phone] with questions or concerns regarding your invoice.   IF you received labwork today, you will receive an invoice from [facility]. Please contact [facility] at [phone] with questions or concerns regarding your invoice.   Our billing staff will not be able to assist you with questions regarding bills from these companies.  You will be contacted with the lab results as soon as they are available. The fastest way to get your results is to activate your My Chart account. Instructions are located on the last page of this paperwork. If you have not heard from [place] regarding the results in 2 weeks, please contact this office.       I personally performed the services described in this documentation, which was scribed in my presence. The recorded information has been reviewed and considered for accuracy and completeness, addended by me as needed, and agree with information above.  Signed,   [name], MD [facility] at [facility].  06/23/17 5:05 PM

## 2017-06-25 ENCOUNTER — Ambulatory Visit: Payer: BC Managed Care – PPO | Admitting: Physician Assistant

## 2017-06-25 ENCOUNTER — Other Ambulatory Visit: Payer: Self-pay

## 2017-06-25 ENCOUNTER — Ambulatory Visit (INDEPENDENT_AMBULATORY_CARE_PROVIDER_SITE_OTHER): Payer: BC Managed Care – PPO

## 2017-06-25 ENCOUNTER — Ambulatory Visit: Payer: Self-pay | Admitting: *Deleted

## 2017-06-25 ENCOUNTER — Encounter: Payer: Self-pay | Admitting: Physician Assistant

## 2017-06-25 VITALS — BP 120/89 | HR 100 | Temp 98.3°F | Resp 16 | Ht 64.0 in | Wt 188.2 lb

## 2017-06-25 DIAGNOSIS — J069 Acute upper respiratory infection, unspecified: Secondary | ICD-10-CM | POA: Diagnosis not present

## 2017-06-25 DIAGNOSIS — R059 Cough, unspecified: Secondary | ICD-10-CM

## 2017-06-25 DIAGNOSIS — R079 Chest pain, unspecified: Secondary | ICD-10-CM | POA: Diagnosis not present

## 2017-06-25 DIAGNOSIS — R05 Cough: Secondary | ICD-10-CM

## 2017-06-25 DIAGNOSIS — R509 Fever, unspecified: Secondary | ICD-10-CM | POA: Diagnosis not present

## 2017-06-25 LAB — POCT CBC
Granulocyte percent: 72.4 %G (ref 37–80)
HCT, POC: 43.3 % (ref 37.7–47.9)
Hemoglobin: 14.1 g/dL (ref 12.2–16.2)
Lymph, poc: 1.9 (ref 0.6–3.4)
MCH, POC: 28.2 pg (ref 27–31.2)
MCHC: 32.6 g/dL (ref 31.8–35.4)
MCV: 86.3 fL (ref 80–97)
MID (cbc): 0.7 (ref 0–0.9)
MPV: 8.5 fL (ref 0–99.8)
POC Granulocyte: 6.7 (ref 2–6.9)
POC LYMPH PERCENT: 20.5 %L (ref 10–50)
POC MID %: 7.1 % (ref 0–12)
Platelet Count, POC: 303 10*3/uL (ref 142–424)
RBC: 5.01 M/uL (ref 4.04–5.48)
RDW, POC: 13.4 %
WBC: 9.2 10*3/uL (ref 4.6–10.2)

## 2017-06-25 LAB — POC INFLUENZA A&B (BINAX/QUICKVUE)
Influenza A, POC: NEGATIVE
Influenza B, POC: NEGATIVE

## 2017-06-25 MED ORDER — HYDROCODONE-HOMATROPINE 5-1.5 MG/5ML PO SYRP: 5.0000 mL | ORAL_SOLUTION | Freq: Three times a day (TID) | ORAL | 0 refills | Status: DC | PRN

## 2017-06-25 NOTE — Patient Instructions (Addendum)
Your EKG looks fine.  Your chest x-ray looks perfect.  Negative for the flu.  Your white blood cell count is within normal range.   Stay well hydrated. Get lost of rest. Wash your hands often.   -Foods that can help speed recovery: honey, garlic, chicken soup, elderberries, green tea.  -Supplements that can help speed recovery: vitamin C, zinc, elderberry extract, quercetin, ginseng, selenium -Supplement with prebiotics and probiotics.   Advil or ibuprofen for pain. Do not take Aspirin.  Drink enough water and fluids to keep your urine clear or pale yellow.  For sore throat: ? Gargle with 8 oz of salt water ( tsp of salt per 1 qt of water) as often as every 1-2 hours to soothe your throat.  Gargle liquid benadryl.  Cepacol throat lozenges (if you are not at risk for choking).  For sore throat try using a honey-based tea. Use 3 teaspoons of honey with juice squeezed from half lemon. Place shaved pieces of ginger into 1/2-1 cup of water and warm over stove top. Then mix the ingredients and repeat every 4 hours as needed.  Cough Syrup Recipe: Sweet Lemon & Honey Thyme  Ingredients a handful of fresh thyme sprigs   1 pint of water (2 cups)  1/2 cup honey (raw is best, but regular will do)  1/2 lemon chopped Instructions 1. Place the lemon in the pint jar and cover with the honey. The honey will macerate the lemons and draw out liquids which taste so delicious! 2. Meanwhile, toss the thyme leaves into a saucepan and cover them with the water. 3. Bring the water to a gentle simmer and reduce it to half, about a cup of tea. 4. When the tea is reduced and cooled a bit, strain the sprigs & leaves, add it into the pint jar and stir it well. 5. Give it a shake and use a spoonful as needed. 6. Store your homemade cough syrup in the refrigerator for about a month.  What causes a cough? In adults, common causes of a cough include: ?An infection of the airways or lungs (such as the common  cold) ?Postnasal drip - Postnasal drip is when mucus from the nose drips down or flows along the back of the throat. Postnasal drip can happen when people have: .A cold .Allergies .A sinus infection - The sinuses are hollow areas in the bones of the face that open into the nose. ?Lung conditions, like asthma and chronic obstructive pulmonary disease (COPD) - Both of these conditions can make it hard to breathe. COPD is usually caused by smoking. ?Acid reflux - Acid reflux is when the acid that is normally in your stomach backs up into your esophagus (the tube that carries food from your mouth to your stomach). ?A side effect from blood pressure medicines called "ACE inhibitors" ?Smoking cigarettes  Is there anything I can do on my own to get rid of my cough? Yes. To help get rid of your cough, you can: ?Use a humidifier in your bedroom ?Use an over-the-counter cough medicine, or suck on cough drops or hard candy ?Stop smoking, if you smoke ?If you have allergies, avoid the things you are allergic to (like pollen, dust, animals, or mold) If you have acid reflux, your doctor or nurse will tell you which lifestyle changes can help reduce symptoms.    Thank you for coming in today. I hope you feel we met your needs.  Feel free to call PCP if you have  any questions or further requests.  Please consider signing up for MyChart if you do not already have it, as this is a great way to communicate with me.  Best,  Whitney McVey, PA-C  IF you received an x-ray today, you will receive an invoice from [facility]. Please contact [facility] at [phone] with questions or concerns regarding your invoice.   IF you received labwork today, you will receive an invoice from [facility]. Please contact [facility] at [phone] with questions or concerns regarding your invoice.   Our billing staff will not be able to assist you with questions regarding bills from these companies.  You  will be contacted with the lab results as soon as they are available. The fastest way to get your results is to activate your My Chart account. Instructions are located on the last page of this paperwork. If you have not heard from [place] regarding the results in 2 weeks, please contact this office.

## 2017-06-25 NOTE — Progress Notes (Signed)
Shelly [name]  MRN: [id number] DOB: 07/30/1975  PCP: Porfirio OarJeffery, Chelle, PA-C  Subjective:  Pt is a 42 year old female who presents to clinic for chest pain. Burning of her chest and upper back starting this morning. Pain goes all the way across upper chest from right to left.  Endorses head pressure, lightheadedness and dizziness.  Intense chest pressure this morning.  +productive cough, dizziness, HA.  Possible sick contacts at school. She teaches 6 th grade.   She was here 3 days ago c/o sore throat, negative strep, and treated supportively for viral laryngitis.   Review of Systems  Constitutional: Negative for chills, diaphoresis, fatigue and fever.  HENT: Positive for congestion and sore throat. Negative for postnasal drip, rhinorrhea, sinus pressure and sinus pain.   Respiratory: Positive for cough and choking. Negative for shortness of breath and wheezing.   Cardiovascular: Positive for chest pain.  Musculoskeletal: Positive for back pain.  Psychiatric/Behavioral: Negative for sleep disturbance.    Patient Active Problem List   Diagnosis Date Noted  . Acute periodontal abscess 09/07/2016  . Depression 09/07/2016  . 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 FO (foramen ovale) 09/09/2014  . Smoking addiction 06/05/2014  . Persistent asthma 06/05/2014  . GERD without esophagitis 06/05/2014  . SARCOIDOSIS 04/05/2007  . ARTHRITIS 04/05/2007    Current Outpatient Medications on File Prior to Visit  Medication Sig Dispense Refill  . albuterol (PROVENTIL HFA;VENTOLIN HFA) 108 (90 Base) MCG/ACT inhaler Inhale into the lungs as needed.    . ALPRAZolam (XANAX) 0.5 MG tablet Take by mouth.    Marland Kitchen. aspirin (GOODSENSE ASPIRIN) 325 MG tablet Take 325 mg by mouth.    . cetirizine (ZYRTEC) 10 MG tablet Take 10 mg by mouth daily.    . fluticasone (FLONASE) 50 MCG/ACT nasal spray Place 2 sprays into both nostrils  daily. 16 g 12  . fluticasone furoate-vilanterol (BREO ELLIPTA) 200-25 MCG/INH AEPB Inhale 1 puff into the lungs daily. 1 each 11  . venlafaxine XR (EFFEXOR-XR) 150 MG 24 hr capsule Take 150 mg by mouth daily with breakfast.    . venlafaxine XR (EFFEXOR-XR) 75 MG 24 hr capsule Take 75 mg by mouth.     No current facility-administered medications on file prior to visit.     Allergies  Allergen Reactions  . Clindamycin/Lincomycin   . Keflex [Cephalexin]   . Penicillins   . Sulfonamide Derivatives      Objective:  BP 120/89   Pulse 100   Temp 98.3 F (36.8 C) (Oral)   Resp 16   Ht 5\' 4"  (1.626 m)   Wt 188 lb 3.2 oz (85.4 kg)   LMP 06/02/2017   SpO2 96%   BMI 32.30 kg/m   Orthostatic VS for the past 24 hrs:  BP- Lying Pulse- Lying BP- Sitting Pulse- Sitting BP- Standing at 0 minutes Pulse- Standing at 0 minutes  06/25/17 1407 129/89 89 120/89 99 125/89 109     Physical Exam  Constitutional: She is oriented to person, place, and time and well-developed, well-nourished, and in no distress. No distress.  HENT:  Right Ear: Tympanic membrane normal.  Left Ear: Tympanic membrane normal.  Nose: Mucosal edema present. No rhinorrhea. Right sinus exhibits no maxillary sinus tenderness and no frontal sinus tenderness. Left sinus exhibits no maxillary sinus tenderness and no frontal sinus tenderness.  Mouth/Throat: Oropharynx is clear and moist and mucous membranes are normal.  Cardiovascular: Normal rate,  regular rhythm and normal heart sounds.  Pulmonary/Chest: Effort normal and breath sounds normal. No respiratory distress. She has no wheezes. She has no rales. She exhibits tenderness (left and right upper lateral chest). She exhibits no bony tenderness, no crepitus and no deformity.    Neurological: She is alert and oriented to person, place, and time. GCS score is 15.  Skin: Skin is warm and dry.  Psychiatric: Mood, memory, affect and judgment normal.  Vitals reviewed.  Dg  Chest 2 View  Result Date: 06/25/2017 CLINICAL DATA:  Cough for 1 week EXAM: CHEST - 2 VIEW COMPARISON:  11/28/2016 FINDINGS: Stable linear opacity at the right base consistent with scarring. There is no edema, consolidation, effusion, or pneumothorax. Normal heart size and mediastinal contours. IMPRESSION: 1. No acute finding. 2. Mild scarring at the right base. Electronically Signed   By: Marnee Spring M.D.   On: 06/25/2017 14:49    Results for orders placed or performed in visit on 06/25/17  POC Influenza A&B(BINAX/QUICKVUE)  Result Value Ref Range   Influenza A, POC Negative Negative   Influenza B, POC Negative Negative  POCT CBC  Result Value Ref Range   WBC 9.2 4.6 - 10.2 K/uL   Lymph, poc 1.9 0.6 - 3.4   POC LYMPH PERCENT 20.5 10 - 50 %L   MID (cbc) 0.7 0 - 0.9   POC MID % 7.1 0 - 12 %M   POC Granulocyte 6.7 2 - 6.9   Granulocyte percent 72.4 37 - 80 %G   RBC 5.01 4.04 - 5.48 M/uL   Hemoglobin 14.1 12.2 - 16.2 g/dL   HCT, POC 16.1 09.6 - 47.9 %   MCV 86.3 80 - 97 fL   MCH, POC 28.2 27 - 31.2 pg   MCHC 32.6 31.8 - 35.4 g/dL   RDW, POC 04.5 %   Platelet Count, POC 303 142 - 424 K/uL   MPV 8.5 0 - 99.8 fL    EKG unchanged from tracing one year ago. EKG nsr.  Assessment and Plan :  1. Acute upper respiratory infection 2. Chest pain, unspecified type - EKG 12-Lead - pt presents with acute onset of chest "burning". She endorses symptoms of URI. Negative flu, negative recent strep. WBC count is wnl. Chest x-ray is negative. EKG is not concerning. Vitals are stable. She is not orthostatic. Suspect viral illness. Plan to treat supportively, push fluids. OOW note written. RTC if symptoms fail to improve or if they worsen. She understands and agrees.  3. Fever, unspecified fever cause - POC Influenza A&B(BINAX/QUICKVUE) - POCT CBC  4. Cough - DG Chest 2 View; Future - HYDROcodone-homatropine (HYCODAN) 5-1.5 MG/5ML syrup; Take 5 mLs by mouth every 8 (eight) hours as needed for  cough.  Dispense: 120 mL; Refill: 0   Whitney Camran Keady, PA-C  [facility] 06/25/2017 2:19 PM

## 2017-06-25 NOTE — Telephone Encounter (Signed)
Pt states seen by Dr. Neva SeatGreene on Saturday for similar symptoms; "Woke up feeling worse." LGT of 99.6 this AM, facial pain 4/10, head "pressure.  Reports mild lightheadedness, positional, and some SOB with exertion. States chest feels "like burning sensation." Has been taking Mucinex and following Dr. Paralee CancelGreene's directions. States is staying hydrated. Appt made for today with W. McVey. Care advise given per protocol. Instructed to call back if dizziness, chest "burning" SOB worsens.  Reason for Disposition . [1] Sinus pain (not just congestion) AND [2] fever  Answer Assessment - Initial Assessment Questions 1. LOCATION: "Where does it hurt?"      Headache,throat, facial pain 2. ONSET: "When did the sinus pain start?"  (e.g., hours, days)      This AM 3. SEVERITY: "How bad is the pain?"   (Scale 1-10; mild, moderate or severe)   - MILD (1-3): doesn't interfere with normal activities    - MODERATE (4-7): interferes with normal activities (e.g., work or school) or awakens from sleep   - SEVERE (8-10): excruciating pain and patient unable to do any normal activities        4/10 4. RECURRENT SYMPTOM: "Have you ever had sinus problems before?" If so, ask: "When was the last time?" and "What happened that time?"      Seen Saturday by Dr. Neva SeatGreene, "Feels worse today." 5. NASAL CONGESTION: "Is the nose blocked?" If so, ask, "Can you open it or must you breathe through the mouth?"     Yes, stuffy 6. NASAL DISCHARGE: "Do you have discharge from your nose?" If so ask, "What color?"     No 7. FEVER: "Do you have a fever?" If so, ask: "What is it, how was it measured, and when did it start?"      99.6 this am 8. OTHER SYMPTOMS: "Do you have any other symptoms?" (e.g., sore throat, cough, earache, difficulty breathing)     Drainage back of throat, chest burning, SOB, mild 9. PREGNANCY: "Is there any chance you are pregnant?" "When was your last menstrual period?"     No  Protocols used: SINUS PAIN OR  CONGESTION-A-AH

## 2017-06-26 ENCOUNTER — Other Ambulatory Visit: Payer: Self-pay | Admitting: Family Medicine

## 2017-06-26 ENCOUNTER — Telehealth: Payer: Self-pay | Admitting: Family Medicine

## 2017-06-26 DIAGNOSIS — J020 Streptococcal pharyngitis: Secondary | ICD-10-CM

## 2017-06-26 LAB — CULTURE, GROUP A STREP: Strep A Culture: POSITIVE — AB

## 2017-06-26 MED ORDER — AZITHROMYCIN 250 MG PO TABS: ORAL_TABLET | ORAL | 0 refills | Status: DC

## 2017-06-26 NOTE — Telephone Encounter (Signed)
[name] saying patient needs an antibiotic asap.  She has been seen twice and strep test came back positive.

## 2017-06-26 NOTE — Progress Notes (Signed)
See lab note - positive strep cx.

## 2017-06-26 NOTE — Telephone Encounter (Signed)
Tina  From  [facility]  With    Throat  culture  Results      Beta  Strep group A  Pos  -   Spec   Obtained   on 06/23/2017

## 2017-06-26 NOTE — Telephone Encounter (Signed)
Dr. Neva SeatGreene,   The culture collected on 03/09 has resulted as positive.  See below for the message from [facility].  Thank you!   ArtistChanda Kisner

## 2017-06-27 ENCOUNTER — Telehealth: Payer: Self-pay | Admitting: Physician Assistant

## 2017-06-27 NOTE — Telephone Encounter (Signed)
Copied from CRM [id number]. Topic: General - Other >> Jun 27, 2017  4:27 PM Stephannie LiSimmons, Maddix Kliewer L, NT wrote: Reason for CRM: Patient called and said she is still not feeling a lot better , and would like an extension on her Drs note for the next few days please call her to discuss

## 2017-06-27 NOTE — Telephone Encounter (Signed)
See lab note, sent in Zpak, RTC precautions.

## 2017-07-03 NOTE — Telephone Encounter (Signed)
Unable to leave message mailbox full.

## 2017-07-16 ENCOUNTER — Encounter: Payer: Self-pay | Admitting: Physician Assistant

## 2017-07-16 ENCOUNTER — Other Ambulatory Visit: Payer: Self-pay

## 2017-07-16 ENCOUNTER — Ambulatory Visit: Payer: BC Managed Care – PPO | Admitting: Physician Assistant

## 2017-07-16 ENCOUNTER — Ambulatory Visit (INDEPENDENT_AMBULATORY_CARE_PROVIDER_SITE_OTHER): Payer: BC Managed Care – PPO

## 2017-07-16 VITALS — BP 120/82 | HR 96 | Temp 98.3°F | Resp 18 | Ht 65.08 in | Wt 189.6 lb

## 2017-07-16 DIAGNOSIS — J181 Lobar pneumonia, unspecified organism: Secondary | ICD-10-CM | POA: Diagnosis not present

## 2017-07-16 DIAGNOSIS — R062 Wheezing: Secondary | ICD-10-CM

## 2017-07-16 DIAGNOSIS — J45901 Unspecified asthma with (acute) exacerbation: Secondary | ICD-10-CM

## 2017-07-16 DIAGNOSIS — J189 Pneumonia, unspecified organism: Secondary | ICD-10-CM

## 2017-07-16 MED ORDER — ALBUTEROL SULFATE (2.5 MG/3ML) 0.083% IN NEBU
2.5000 mg | INHALATION_SOLUTION | Freq: Once | RESPIRATORY_TRACT | Status: AC
  Administered 2017-07-16: 2.5 mg via RESPIRATORY_TRACT

## 2017-07-16 MED ORDER — METHYLPREDNISOLONE SODIUM SUCC 125 MG IJ SOLR: 80.0000 mg | Freq: Once | INTRAMUSCULAR | Status: DC

## 2017-07-16 MED ORDER — IPRATROPIUM BROMIDE 0.02 % IN SOLN
0.5000 mg | Freq: Once | RESPIRATORY_TRACT | Status: AC
  Administered 2017-07-16: 0.5 mg via RESPIRATORY_TRACT

## 2017-07-16 MED ORDER — PREDNISONE 20 MG PO TABS: 40.0000 mg | ORAL_TABLET | Freq: Every day | ORAL | 0 refills | Status: DC

## 2017-07-16 MED ORDER — LEVOFLOXACIN 750 MG PO TABS: 750.0000 mg | ORAL_TABLET | Freq: Every day | ORAL | 0 refills | Status: DC

## 2017-07-16 MED ORDER — ALBUTEROL SULFATE HFA 108 (90 BASE) MCG/ACT IN AERS: 1.0000 | INHALATION_SPRAY | RESPIRATORY_TRACT | 0 refills | Status: AC | PRN

## 2017-07-16 NOTE — Patient Instructions (Addendum)
Please take antibiotic and prednisone as prescribed. Prednisone is a steroid and can cause side effects such as headache, irritability, nausea, vomiting, increased heart rate, increased blood pressure, increased blood sugar, appetite changes, and insomnia. Please take tablets in the morning with a full meal to help decrease the chances of these side effects.   Continue taking albuterol inhaler every 4-6 hours as needed for wheezing and shortness of breath.  Return here in 3 days for reevaluation.  If any of your symptoms worsen or you develop new concerning symptoms please return sooner.  Thank you for letting me participate in your health and well being.  Community-Acquired Pneumonia, Adult Pneumonia is an infection of the lungs. One type of pneumonia can happen while a person is in a hospital. A different type can happen when a person is not in a hospital (community-acquired pneumonia). It is easy for this kind to spread from person to person. It can spread to you if you breathe near an infected person who coughs or sneezes. Some symptoms include:  A dry cough.  A wet (productive) cough.  Fever.  Sweating.  Chest pain.  Follow these instructions at home:  Take over-the-counter and prescription medicines only as told by your doctor. ? Only take cough medicine if you are losing sleep. ? If you were prescribed an antibiotic medicine, take it as told by your doctor. Do not stop taking the antibiotic even if you start to feel better.  Sleep with your head and neck raised (elevated). You can do this by putting a few pillows under your head, or you can sleep in a recliner.  Do not use tobacco products. These include cigarettes, chewing tobacco, and e-cigarettes. If you need help quitting, ask your doctor.  Drink enough water to keep your pee (urine) clear or pale yellow. A shot (vaccine) can help prevent pneumonia. Shots are often suggested for:  People older than 44 years of  age.  People older than 44 years of age: ? Who are having cancer treatment. ? Who have long-term (chronic) lung disease. ? Who have problems with their body's defense system (immune system).  You may also prevent pneumonia if you take these actions:  Get the flu (influenza) shot every year.  Go to the dentist as often as told.  Wash your hands often. If soap and water are not available, use hand sanitizer.  Contact a doctor if:  You have a fever.  You lose sleep because your cough medicine does not help. Get help right away if:  You are short of breath and it gets worse.  You have more chest pain.  Your sickness gets worse. This is very serious if: ? You are an older adult. ? Your body's defense system is weak.  You cough up blood. This information is not intended to replace advice given to you by your health care provider. Make sure you discuss any questions you have with your health care provider. Document Released: 09/20/2007 Document Revised: 09/09/2015 Document Reviewed: 07/29/2014 Elsevier Interactive Patient Education  2018 [employer]   Asthma, Acute Bronchospasm Acute bronchospasm caused by asthma is also referred to as an asthma attack. Bronchospasm means your air passages become narrowed. The narrowing is caused by inflammation and tightening of the muscles in the air tubes (bronchi) in your lungs. This can make it hard to breathe or cause you to wheeze and cough. What are the causes? Possible triggers are:  Animal dander from the skin, hair, or feathers of  animals.  Dust mites contained in house dust.  Cockroaches.  Pollen from trees or grass.  Mold.  Cigarette or tobacco smoke.  Air pollutants such as dust, household cleaners, hair sprays, aerosol sprays, paint fumes, strong chemicals, or strong odors.  Cold air or weather changes. Cold air may trigger inflammation. Winds increase molds and pollens in the air.  Strong emotions such as crying or  laughing hard.  Stress.  Certain medicines such as aspirin or beta-blockers.  Sulfites in foods and drinks, such as dried fruits and wine.  Infections or inflammatory conditions, such as a flu, cold, or inflammation of the nasal membranes (rhinitis).  Gastroesophageal reflux disease (GERD). GERD is a condition where stomach acid backs up into your esophagus.  Exercise or strenuous activity.  What are the signs or symptoms?  Wheezing.  Excessive coughing, particularly at night.  Chest tightness.  Shortness of breath. How is this diagnosed? Your health care provider will ask you about your medical history and perform a physical exam. A chest X-ray or blood testing may be performed to look for other causes of your symptoms or other conditions that may have triggered your asthma attack. How is this treated? Treatment is aimed at reducing inflammation and opening up the airways in your lungs. Most asthma attacks are treated with inhaled medicines. These include quick relief or rescue medicines (such as bronchodilators) and controller medicines (such as inhaled corticosteroids). These medicines are sometimes given through an inhaler or a nebulizer. Systemic steroid medicine taken by mouth or given through an IV tube also can be used to reduce the inflammation when an attack is moderate or severe. Antibiotic medicines are only used if a bacterial infection is present. Follow these instructions at home:  Rest.  Drink plenty of liquids. This helps the mucus to remain thin and be easily coughed up. Only use caffeine in moderation and do not use alcohol until you have recovered from your illness.  Do not smoke. Avoid being exposed to secondhand smoke.  You play a critical role in keeping yourself in good health. Avoid exposure to things that cause you to wheeze or to have breathing problems.  Keep your medicines up-to-date and available. Carefully follow your health care provider's  treatment plan.  Take your medicine exactly as prescribed.  When pollen or pollution is bad, keep windows closed and use an air conditioner or go to places with air conditioning.  Asthma requires careful medical care. See your health care provider for a follow-up as advised. If you are more than [redacted] weeks pregnant and you were prescribed any new medicines, let your obstetrician know about the visit and how you are doing. Follow up with your health care provider as directed.  After you have recovered from your asthma attack, make an appointment with your outpatient doctor to talk about ways to reduce the likelihood of future attacks. If you do not have a doctor who manages your asthma, make an appointment with a primary care doctor to discuss your asthma. Get help right away if:  You are getting worse.  You have trouble breathing. If severe, call your local emergency services (911 in the [place]  You develop chest pain or discomfort.  You are vomiting.  You are not able to keep fluids down.  You are coughing up yellow, green, brown, or bloody sputum.  You have a fever and your symptoms suddenly get worse.  You have trouble swallowing. This information is not intended to replace advice given to  you by your health care provider. Make sure you discuss any questions you have with your health care provider. Document Released: 07/19/2006 Document Revised: 09/15/2015 Document Reviewed: 10/09/2012 Elsevier Interactive Patient Education  2017 [employer].   IF you received an x-ray today, you will receive an invoice from [facility]. Please contact [facility] at [phone] with questions or concerns regarding your invoice.   IF you received labwork today, you will receive an invoice from [facility]. Please contact [facility] at [phone] with questions or concerns regarding your invoice.   Our billing staff will not be able to assist you with questions regarding bills  from these companies.  You will be contacted with the lab results as soon as they are available. The fastest way to get your results is to activate your My Chart account. Instructions are located on the last page of this paperwork. If you have not heard from [place] regarding the results in 2 weeks, please contact this office.

## 2017-07-16 NOTE — Progress Notes (Signed)
MRN: [id number] DOB: 1975-06-07  Subjective:   Shelly [name] is a 42 y.o. female presenting for chief complaint of Cough (X 3 weeks) and Sinus Problem (X 4 days) .  Reports 3 day history of dry hacking cough. She cannot get anything out but feels like she needs to. She is coughing so hard she is peeing.  Has some wheezing and a little SOB.  Has low grade fever, which broke yesterday. Has some sinus drainage and rhinorrhea before the coughing started. Denies sore throat, sinus pain, ear pain, chest pain, nausea, vomiting, and diarrhea.  Was treated with a zpack 3 weeks ago for strep throat. No recent hospitalizations.   Has tried advil cold and sinus for this episode.  Has  history of seasonal allergies, takes zyrtec and flonase daily. Has history of asthma, takes breo daily and albuterol prn. Has hx of sarcoidosis. Has had to use albuterol twice today. Patient has had flu shot this season. Current smoker, smokes 0.5 ppd. Has hx of prediabetes, last A1C 7 months ago was 6.2, last glucose 7 months ago was 119. Denies any other aggravating or relieving factors, no other questions or concerns.  Shelly [name] has a current medication list which includes the following prescription(s): albuterol, alprazolam, aspirin, cetirizine, fluticasone, fluticasone furoate-vilanterol, venlafaxine xr, venlafaxine xr, azithromycin, and hydrocodone-homatropine. Also is allergic to clindamycin/lincomycin; keflex [cephalexin]; penicillins; and sulfonamide derivatives.  Shelly [name]  has a past medical history of Allergy, Anemia, Anxiety, Arthritis, Depression, GERD (gastroesophageal reflux disease), Migraines, Miscarriage, PFO (patent foramen ovale), Sarcoidosis, Stroke (HCC), and TIA (transient ischemic attack). Also  has a past surgical history that includes Cholecystectomy; Nasal sinus surgery; Hand surgery; Bronchoscopy; Cesarean section; and miscarriage (08/10/14).   Objective:   Vitals: BP 120/82 (BP Location: Right Arm,  Patient Position: Sitting, Cuff Size: Large)   Pulse 96   Temp 98.3 F (36.8 C) (Oral)   Resp 18   Ht 5' 5.08" (1.653 m)   Wt 189 lb 9.6 oz (86 kg)   LMP 06/27/2017 (Exact Date)   SpO2 96%   BMI 31.47 kg/m   Physical Exam  Constitutional: She is oriented to person, place, and time. She appears well-developed and well-nourished. No distress.  HENT:  Head: Normocephalic and atraumatic.  Right Ear: Tympanic membrane, external ear and ear canal normal.  Left Ear: Tympanic membrane, external ear and ear canal normal.  Nose: Nose normal.  Mouth/Throat: Uvula is midline, oropharynx is clear and moist and mucous membranes are normal. Tonsils are 2+ on the right. Tonsils are 2+ on the left. No tonsillar exudate.  Eyes: Conjunctivae are normal.  Neck: Normal range of motion.  Cardiovascular: Normal rate, regular rhythm, normal heart sounds and intact distal pulses.  Pulmonary/Chest: Effort normal. No tachypnea. No respiratory distress. She has no decreased breath sounds. She has wheezes (few scattered throughout bilateral lung fields). She has rhonchi (diffuse throughout bilateral lungs). She has no rales.  Neurological: She is alert and oriented to person, place, and time.  Skin: Skin is warm and dry.  Psychiatric: She has a normal mood and affect.  Vitals reviewed.   No results found for this or any previous visit (from the past 24 hour(s)).  Peak flow reading before breathing tx was 300, about 71 % of predicted. Peak flow after breathing tx was 320, about 76% of predicted. Noes she feels much more open and better after breathing tx. Wheezing improved. Rhonchi still noted throughout bilateral lung fields.   Dg Chest 2 View  Result Date: 07/16/2017 CLINICAL DATA:  Cough over the last 3 days.  History of sarcoid. EXAM: CHEST - 2 VIEW COMPARISON:  06/25/2017 FINDINGS: Heart size is normal. Mediastinal shadows are normal. Chronic pulmonary scarring Mains evident. Newly seen mild patchy density  at the right base consistent with mild pneumonia. No dense consolidation or lobar collapse. No effusions. Bony structures are unremarkable. IMPRESSION: Pulmonary scarring consistent with the history of sarcoid. Newly seen patchy density at the right base consistent with mild pneumonia. Electronically Signed   By: Paulina Fusi M.D.   On: 07/16/2017 17:32    Assessment and Plan :  1. Community acquired pneumonia of right lower lobe of lung (HCC) Plain film with new patch in RLL consistent with mild pneumonia. Due to recent abx use and pt's allergies, will treat with leviquin at this time. Discussed smoking cessation, pt is not ready to quit at this time.  - levofloxacin (LEVAQUIN) 750 MG tablet; Take 1 tablet (750 mg total) by mouth daily for 5 days.  Dispense: 5 tablet; Refill: 0  2. Persistent asthma with acute exacerbation Likely exacerbated by CAP.  Pt is in no distress, vitals are stable. Peak flow after breathing tx was 76% of predicted. SpO2 96%.  Declines steroid injection. Recommend oral pred course with albuterol inhaler every 4-6 hours. Return here in 3 days for reevaluation or sooner if sx worsen.  - predniSONE (DELTASONE) 20 MG tablet; Take 2 tablets (40 mg total) by mouth daily with breakfast for 5 days.  Dispense: 10 tablet; Refill: 0 - albuterol (PROVENTIL HFA;VENTOLIN HFA) 108 (90 Base) MCG/ACT inhaler; Inhale 1-2 puffs into the lungs every 4 (four) hours as needed.  Dispense: 6.7 g; Refill: 0  3. Wheezing Improved with breathing tx.  - albuterol (PROVENTIL) (2.5 MG/3ML) 0.083% nebulizer solution 2.5 mg - ipratropium (ATROVENT) nebulizer solution 0.5 mg - Care order/instruction - DG Chest 2 View; Future  Benjiman Core, PA-C  Primary Care at [facility] 07/16/2017 5:05 PM

## 2017-07-19 ENCOUNTER — Encounter: Payer: Self-pay | Admitting: Physician Assistant

## 2017-07-19 ENCOUNTER — Ambulatory Visit: Payer: BC Managed Care – PPO | Admitting: Physician Assistant

## 2017-07-19 ENCOUNTER — Other Ambulatory Visit: Payer: Self-pay

## 2017-07-19 VITALS — BP 126/62 | HR 109 | Temp 99.0°F | Resp 18 | Ht 65.08 in | Wt 191.6 lb

## 2017-07-19 DIAGNOSIS — Z862 Personal history of diseases of the blood and blood-forming organs and certain disorders involving the immune mechanism: Secondary | ICD-10-CM

## 2017-07-19 DIAGNOSIS — J181 Lobar pneumonia, unspecified organism: Secondary | ICD-10-CM

## 2017-07-19 DIAGNOSIS — J189 Pneumonia, unspecified organism: Secondary | ICD-10-CM

## 2017-07-19 DIAGNOSIS — J45901 Unspecified asthma with (acute) exacerbation: Secondary | ICD-10-CM

## 2017-07-19 LAB — POCT INFLUENZA A/B
INFLUENZA B, POC: NEGATIVE
Influenza A, POC: NEGATIVE

## 2017-07-19 MED ORDER — ALBUTEROL SULFATE (2.5 MG/3ML) 0.083% IN NEBU
5.0000 mg | INHALATION_SOLUTION | Freq: Once | RESPIRATORY_TRACT | Status: AC
  Administered 2017-07-19: 5 mg via RESPIRATORY_TRACT

## 2017-07-19 MED ORDER — IPRATROPIUM BROMIDE 0.02 % IN SOLN
0.5000 mg | Freq: Once | RESPIRATORY_TRACT | Status: AC
  Administered 2017-07-19: 0.5 mg via RESPIRATORY_TRACT

## 2017-07-19 MED ORDER — PREDNISONE 20 MG PO TABS: ORAL_TABLET | ORAL | 0 refills | Status: DC

## 2017-07-19 NOTE — Patient Instructions (Addendum)
Please complete antibiotic as prescribed. I have extended your course of prednisone. Continue inhalers. Follow up with me on Saturday. Return sooner if symptoms worsen or you develop new concerning symptoms.     IF you received an x-ray today, you will receive an invoice from [facility]. Please contact [facility] at [phone] with questions or concerns regarding your invoice.   IF you received labwork today, you will receive an invoice from [facility]. Please contact [facility] at [phone] with questions or concerns regarding your invoice.   Our billing staff will not be able to assist you with questions regarding bills from these companies.  You will be contacted with the lab results as soon as they are available. The fastest way to get your results is to activate your My Chart account. Instructions are located on the last page of this paperwork. If you have not heard from us regarding the results in 2 weeks, please contact this office.

## 2017-07-19 NOTE — Progress Notes (Signed)
Shelly [name]  MRN: [id number] DOB: 1975-12-06  Subjective:  Shelly [name] is a 42 y.o. female seen in office today for a chief complaint of follow up on CAP and asthma exacerbation.  Please see last OV note on 07/16/17 for additional details.   Today, she reports feeling about a third better. Notes the coughing has worsened, able to get more up now. Still fatigued. Wheezing and SOB have improved, especially while sitting. Chest pain has resolved. Chills have also resolved.   Taken leviquin and prednisone as prescribed. Has been using breo and albuterol. Has had to use albuterol twice daily since last visit. Has continued to smoke.   Went to work but boss sent her home.   No other questions or concerns.   Review of Systems  Constitutional: Negative for chills and diaphoresis.  HENT: Negative for congestion.   Cardiovascular: Negative for chest pain.  Gastrointestinal: Negative for abdominal pain, diarrhea, nausea and vomiting.  Musculoskeletal: Negative for back pain.  Neurological: Negative for dizziness and light-headedness.      Patient Active Problem List   Diagnosis Date Noted  . Acute periodontal abscess 09/07/2016  . Depression 09/07/2016  . 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 FO (foramen ovale) 09/09/2014  . Smoking addiction 06/05/2014  . Persistent asthma 06/05/2014  . GERD without esophagitis 06/05/2014  . SARCOIDOSIS 04/05/2007  . ARTHRITIS 04/05/2007    Current Outpatient Medications on File Prior to Visit  Medication Sig Dispense Refill  . albuterol (PROVENTIL HFA;VENTOLIN HFA) 108 (90 Base) MCG/ACT inhaler Inhale 1-2 puffs into the lungs every 4 (four) hours as needed. 6.7 g 0  . ALPRAZolam (XANAX) 0.5 MG tablet Take by mouth.    Marland Kitchen aspirin (GOODSENSE ASPIRIN) 325 MG tablet Take 325 mg by mouth.    . cetirizine (ZYRTEC) 10 MG tablet Take 10 mg by mouth daily.    . fluticasone  (FLONASE) 50 MCG/ACT nasal spray Place 2 sprays into both nostrils daily. 16 g 12  . fluticasone furoate-vilanterol (BREO ELLIPTA) 200-25 MCG/INH AEPB Inhale 1 puff into the lungs daily. 1 each 11  . levofloxacin (LEVAQUIN) 750 MG tablet Take 1 tablet (750 mg total) by mouth daily for 5 days. 5 tablet 0  . predniSONE (DELTASONE) 20 MG tablet Take 2 tablets (40 mg total) by mouth daily with breakfast for 5 days. 10 tablet 0  . venlafaxine XR (EFFEXOR-XR) 150 MG 24 hr capsule Take 150 mg by mouth daily with breakfast.    . venlafaxine XR (EFFEXOR-XR) 75 MG 24 hr capsule Take 75 mg by mouth.    Marland Kitchen HYDROcodone-homatropine (HYCODAN) 5-1.5 MG/5ML syrup Take 5 mLs by mouth every 8 (eight) hours as needed for cough. (Patient not taking: Reported on 07/16/2017) 120 mL 0   No current facility-administered medications on file prior to visit.     Allergies  Allergen Reactions  . Clindamycin/Lincomycin   . Keflex [Cephalexin]   . Penicillins   . Sulfonamide Derivatives       Past Medical History:  Diagnosis Date  . Allergy   . Anemia   . Anxiety   . Arthritis   . Depression   . GERD (gastroesophageal reflux disease)   . Migraines   . Miscarriage   . PFO (patent foramen ovale)   . Sarcoidosis   . Stroke (HCC)   . TIA (transient ischemic attack)     Social History   Socioeconomic History  . Marital  status: Married    Spouse name: Not on file  . Number of children: 1  . Years of education: Not on file  . Highest education level: Not on file  Occupational History  . Not on file  Social Needs  . Financial resource strain: Not on file  . Food insecurity:    Worry: Not on file    Inability: Not on file  . Transportation needs:    Medical: Not on file    Non-medical: Not on file  Tobacco Use  . Smoking status: Current Every Day Smoker    Packs/day: 1.00    Years: 26.00    Pack years: 26.00    Types: Cigarettes  . Smokeless tobacco: Never Used  Substance and Sexual Activity  .  Alcohol use: No    Alcohol/week: 0.0 oz  . Drug use: No  . Sexual activity: Yes  Lifestyle  . Physical activity:    Days per week: Not on file    Minutes per session: Not on file  . Stress: Not on file  Relationships  . Social connections:    Talks on phone: Not on file    Gets together: Not on file    Attends religious service: Not on file    Active member of club or organization: Not on file    Attends meetings of clubs or organizations: Not on file    Relationship status: Not on file  . Intimate partner violence:    Fear of current or ex partner: Not on file    Emotionally abused: Not on file    Physically abused: Not on file    Forced sexual activity: Not on file  Other Topics Concern  . Not on file  Social History Narrative  . Not on file    Objective:  BP 126/62   Pulse (!) 109   Temp 99 F (37.2 C) (Oral)   Resp 18   Ht 5' 5.08" (1.653 m)   Wt 191 lb 9.6 oz (86.9 kg)   LMP 06/27/2017 (Exact Date)   SpO2 96%   BMI 31.81 kg/m   Physical Exam  Constitutional: She is oriented to person, place, and time and well-developed, well-nourished, and in no distress.  HENT:  Head: Normocephalic and atraumatic.  Eyes: Conjunctivae are normal.  Neck: Normal range of motion.  Cardiovascular: Regular rhythm and normal heart sounds. Tachycardia present.  Pulmonary/Chest: No accessory muscle usage. No respiratory distress. She has no decreased breath sounds. She has wheezes (scattered wheezes throughout bilateral lung fields). She has rhonchi (few rhonchi throughout bilateral lung fields ). She has no rales.  Neurological: She is alert and oriented to person, place, and time. Gait normal.  Skin: Skin is warm and dry. She is not diaphoretic.  Psychiatric: Affect normal.  Vitals reviewed.   Results for orders placed or performed in visit on 07/19/17 (from the past 24 hour(s))  POCT Influenza A/B     Status: None   Collection Time: 07/19/17  4:50 PM  Result Value Ref Range    Influenza A, POC Negative Negative   Influenza B, POC Negative Negative     Temp Readings from Last 3 Encounters:  07/19/17 99 F (37.2 C) (Oral)  07/16/17 98.3 F (36.8 C) (Oral)  06/25/17 98.3 F (36.8 C) (Oral)   Peak flow reading before breathing tx was 300, about 71 % of predicted. Peak flow after two breathing txs was 350, about 83% of predicted. Noes she feels much more open and  better after breathing tx. Wheezing improved but still noted throughout lungs. Few rhonchi still noted throughout bilateral lung fields. Sp02 after breathing tx is 98%.    Assessment and Plan :  This case was precepted with Dr. Creta Levin.  1. Persistent asthma with acute exacerbation, unspecified asthma severity Sx, peak flow, SpO2, and lung sounds improved with two duonebs. SpO2 98%. There is still some wheezing and rhonchi noted on lung exam. Will extend prednisone course at this time. Recommend continuing with leviquin and breo daily, albuterol inhaler ever 4-6 hrs prn. Follow up in 2 days for reevaluation. Given strict return/ED precautions if she develops worsening sx or new concerning dx.  - albuterol (PROVENTIL) (2.5 MG/3ML) 0.083% nebulizer solution 5 mg - ipratropium (ATROVENT) nebulizer solution 0.5 mg - POCT Influenza A/B - ipratropium (ATROVENT) nebulizer solution 0.5 mg - albuterol (PROVENTIL) (2.5 MG/3ML) 0.083% nebulizer solution 5 mg  2. Community acquired pneumonia of right lower lobe of lung (HCC) Pt is overall feeling better but does note the cough has worsened. POC flu test negative. Recommend continuing levaquin.  3. History of sarcoidosis Will refer to pulm for further evaluation due to hx of sarcoidosis with pulmonary scarring.  - Ambulatory referral to Pulmonology Meds ordered this encounter  Medications  . albuterol (PROVENTIL) (2.5 MG/3ML) 0.083% nebulizer solution 5 mg  . ipratropium (ATROVENT) nebulizer solution 0.5 mg  . ipratropium (ATROVENT) nebulizer solution 0.5 mg    . albuterol (PROVENTIL) (2.5 MG/3ML) 0.083% nebulizer solution 5 mg  . predniSONE (DELTASONE) 20 MG tablet    Sig: Take 3 PO QAM x3days, 2 PO QAM x3days, 1 PO QAM x3days    Dispense:  18 tablet    Refill:  0    Order Specific Question:   Supervising Provider    Answer:   Nilda Simmer M [[id number]]     Benjiman Core PA-C  Primary Care at [facility] 07/19/2017 4:15 PM

## 2017-07-20 ENCOUNTER — Encounter: Payer: Self-pay | Admitting: Physician Assistant

## 2017-07-21 ENCOUNTER — Ambulatory Visit: Payer: BC Managed Care – PPO | Admitting: Physician Assistant

## 2017-07-21 ENCOUNTER — Other Ambulatory Visit: Payer: Self-pay

## 2017-07-21 ENCOUNTER — Encounter: Payer: Self-pay | Admitting: Physician Assistant

## 2017-07-21 VITALS — BP 124/82 | HR 107 | Temp 98.7°F | Resp 16 | Ht 65.5 in | Wt 194.8 lb

## 2017-07-21 DIAGNOSIS — J45901 Unspecified asthma with (acute) exacerbation: Secondary | ICD-10-CM | POA: Diagnosis not present

## 2017-07-21 DIAGNOSIS — J181 Lobar pneumonia, unspecified organism: Secondary | ICD-10-CM

## 2017-07-21 DIAGNOSIS — J189 Pneumonia, unspecified organism: Secondary | ICD-10-CM

## 2017-07-21 MED ORDER — IPRATROPIUM BROMIDE 0.02 % IN SOLN
0.5000 mg | Freq: Once | RESPIRATORY_TRACT | Status: AC
  Administered 2017-07-21: 0.5 mg via RESPIRATORY_TRACT

## 2017-07-21 MED ORDER — ALBUTEROL SULFATE (2.5 MG/3ML) 0.083% IN NEBU
5.0000 mg | INHALATION_SOLUTION | Freq: Once | RESPIRATORY_TRACT | Status: AC
  Administered 2017-07-21: 5 mg via RESPIRATORY_TRACT

## 2017-07-21 NOTE — Patient Instructions (Addendum)
Please start additional prednisone taper today. Return to clinic if symptoms worsen, do not improve, or as needed.    IF you received an x-ray today, you will receive an invoice from [facility]. Please contact [facility] at [phone] with questions or concerns regarding your invoice.   IF you received labwork today, you will receive an invoice from [facility]. Please contact [facility] at [phone] with questions or concerns regarding your invoice.   Our billing staff will not be able to assist you with questions regarding bills from these companies.  You will be contacted with the lab results as soon as they are available. The fastest way to get your results is to activate your My Chart account. Instructions are located on the last page of this paperwork. If you have not heard from us regarding the results in 2 weeks, please contact this office.

## 2017-07-21 NOTE — Progress Notes (Signed)
Shelly [name]  MRN: [id number] DOB: [birth year]  Subjective:  Shelly [name] is a 42 y.o. female seen in office today for a chief complaint of follow up on CAP and asthma exacerbation.  Please see last OV note on 07/19/17 for additional details. Today, she reports feeling about 75% better. Just completed levaquin and first prednisone course. Will start the second prednisone course today. Has continued with breo daily and albuterol prn. Notes the coughing has improved. Still having some fatigue and SOB.Wheezing improved. Denies fever, chills, chest pain, sore throat, sinus pain, ear pain, and night sweats. Has not used albuterol yet today. Continues to smoke. No recent surgery/travel/immobilization, hx of cancer, leg swelling, hemoptysis, prior DVT/PE, or hormone use. No other questions or concerns.   Review of Systems  Per HPI  Patient Active Problem List   Diagnosis Date Noted  . Acute periodontal abscess 09/07/2016  . Depression 09/07/2016  . 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 FO (foramen ovale) 09/09/2014  . Smoking addiction 06/05/2014  . Persistent asthma 06/05/2014  . GERD without esophagitis 06/05/2014  . SARCOIDOSIS 04/05/2007  . ARTHRITIS 04/05/2007    Current Outpatient Medications on File Prior to Visit  Medication Sig Dispense Refill  . albuterol (PROVENTIL HFA;VENTOLIN HFA) 108 (90 Base) MCG/ACT inhaler Inhale 1-2 puffs into the lungs every 4 (four) hours as needed. 6.7 g 0  . aspirin (GOODSENSE ASPIRIN) 325 MG tablet Take 325 mg by mouth.    . cetirizine (ZYRTEC) 10 MG tablet Take 10 mg by mouth daily.    . fluticasone (FLONASE) 50 MCG/ACT nasal spray Place 2 sprays into both nostrils daily. 16 g 12  . fluticasone furoate-vilanterol (BREO ELLIPTA) 200-25 MCG/INH AEPB Inhale 1 puff into the lungs daily. 1 each 11  . predniSONE (DELTASONE) 20 MG tablet Take 3 PO QAM x3days, 2 PO QAM x3days, 1  PO QAM x3days 18 tablet 0  . venlafaxine XR (EFFEXOR-XR) 150 MG 24 hr capsule Take 150 mg by mouth daily with breakfast.    . venlafaxine XR (EFFEXOR-XR) 75 MG 24 hr capsule Take 75 mg by mouth.     No current facility-administered medications on file prior to visit.     Allergies  Allergen Reactions  . Clindamycin/Lincomycin   . Keflex [Cephalexin]   . Penicillins   . Sulfonamide Derivatives        Objective:  BP 124/82   Pulse (!) 107   Temp 98.7 F (37.1 C) (Oral)   Resp 16   Ht 5' 5.5" (1.664 m)   Wt 194 lb 12.8 oz (88.4 kg)   LMP 06/27/2017 (Exact Date)   SpO2 98%   BMI 31.92 kg/m   Physical Exam  Constitutional: She is oriented to person, place, and time. She appears well-developed and well-nourished.  HENT:  Head: Normocephalic and atraumatic.  Eyes: Conjunctivae are normal.  Neck: Normal range of motion.  Cardiovascular: Normal rate, regular rhythm and normal heart sounds.  Pulmonary/Chest: Effort normal. No accessory muscle usage or stridor. No respiratory distress. She has no decreased breath sounds. She has wheezes (few scattered wheezes throughout bilateal lung fields). She has rhonchi ( few intermittent, cleared with cough). She has no rales.  Neurological: She is alert and oriented to person, place, and time.  Skin: Skin is warm and dry.  Psychiatric: She has a normal mood and affect.  Vitals reviewed.  Peak flow readingbefore breathing tx was330, about78% of predicted. Peak  flow after two breathing txs was 350, about 83% of predicted. Noes she feels better. Wheezing improved but few still noted throughout lungs. No rhonchi noted.   Assessment and Plan :  1. Persistent asthma with acute exacerbation, unspecified asthma severity 2. Community acquired pneumonia of right lower lobe of lung (HCC) Symptoms and peak flow improved with breathing treatment.  Patient declines second breathing treatment.  Recommended starting additional prednisone course and  albuterol inhaler every 4-6 hours as needed.  Given strict ED precautions.  Placed referral for pulmonology at last visit.  She will need to follow-up with them due to her history of sarcoidosis, which is likely complicating her recovery from CAP with asthma exacerbation.  Follow-up here as needed. - albuterol (PROVENTIL) (2.5 MG/3ML) 0.083% nebulizer solution 5 mg - ipratropium (ATROVENT) nebulizer solution 0.5 mg - Care order/instruction   Benjiman CoreBrittany Jessia Kief PA-C  Primary Care at [facility] 07/21/2017 9:21 AM

## 2017-07-23 ENCOUNTER — Encounter (HOSPITAL_BASED_OUTPATIENT_CLINIC_OR_DEPARTMENT_OTHER): Payer: Self-pay | Admitting: Emergency Medicine

## 2017-07-23 ENCOUNTER — Emergency Department (HOSPITAL_BASED_OUTPATIENT_CLINIC_OR_DEPARTMENT_OTHER)
Admission: EM | Admit: 2017-07-23 | Discharge: 2017-07-23 | Disposition: A | Discharge status: Home / Self Care | Payer: BC Managed Care – PPO | Attending: Emergency Medicine | Admitting: Emergency Medicine

## 2017-07-23 ENCOUNTER — Ambulatory Visit: Payer: Self-pay | Admitting: *Deleted

## 2017-07-23 ENCOUNTER — Telehealth: Payer: Self-pay

## 2017-07-23 ENCOUNTER — Emergency Department (HOSPITAL_BASED_OUTPATIENT_CLINIC_OR_DEPARTMENT_OTHER): Payer: BC Managed Care – PPO

## 2017-07-23 ENCOUNTER — Other Ambulatory Visit: Payer: Self-pay

## 2017-07-23 DIAGNOSIS — F419 Anxiety disorder, unspecified: Secondary | ICD-10-CM | POA: Insufficient documentation

## 2017-07-23 DIAGNOSIS — Z9049 Acquired absence of other specified parts of digestive tract: Secondary | ICD-10-CM | POA: Diagnosis not present

## 2017-07-23 DIAGNOSIS — Z79899 Other long term (current) drug therapy: Secondary | ICD-10-CM | POA: Insufficient documentation

## 2017-07-23 DIAGNOSIS — J40 Bronchitis, not specified as acute or chronic: Secondary | ICD-10-CM | POA: Insufficient documentation

## 2017-07-23 DIAGNOSIS — R0602 Shortness of breath: Secondary | ICD-10-CM | POA: Diagnosis present

## 2017-07-23 DIAGNOSIS — Z8673 Personal history of transient ischemic attack (TIA), and cerebral infarction without residual deficits: Secondary | ICD-10-CM | POA: Diagnosis not present

## 2017-07-23 DIAGNOSIS — F17210 Nicotine dependence, cigarettes, uncomplicated: Secondary | ICD-10-CM | POA: Diagnosis not present

## 2017-07-23 DIAGNOSIS — Z7982 Long term (current) use of aspirin: Secondary | ICD-10-CM | POA: Diagnosis not present

## 2017-07-23 DIAGNOSIS — F329 Major depressive disorder, single episode, unspecified: Secondary | ICD-10-CM | POA: Diagnosis not present

## 2017-07-23 LAB — CBC WITH DIFFERENTIAL/PLATELET
BASOS ABS: 0 10*3/uL (ref 0.0–0.1)
BASOS PCT: 0 %
EOS ABS: 0 10*3/uL (ref 0.0–0.7)
Eosinophils Relative: 0 %
HCT: 40.1 % (ref 36.0–46.0)
HEMOGLOBIN: 13.4 g/dL (ref 12.0–15.0)
LYMPHS PCT: 18 %
Lymphs Abs: 2.9 10*3/uL (ref 0.7–4.0)
MCH: 28.6 pg (ref 26.0–34.0)
MCHC: 33.4 g/dL (ref 30.0–36.0)
MCV: 85.5 fL (ref 78.0–100.0)
Monocytes Absolute: 0.2 10*3/uL (ref 0.1–1.0)
Monocytes Relative: 1 %
NEUTROS PCT: 81 %
Neutro Abs: 12.8 10*3/uL — ABNORMAL HIGH (ref 1.7–7.7)
Platelets: 401 10*3/uL — ABNORMAL HIGH (ref 150–400)
RBC: 4.69 MIL/uL (ref 3.87–5.11)
RDW: 13.9 % (ref 11.5–15.5)
WBC: 15.9 10*3/uL — ABNORMAL HIGH (ref 4.0–10.5)

## 2017-07-23 LAB — BASIC METABOLIC PANEL
ANION GAP: 12 (ref 5–15)
BUN: 15 mg/dL (ref 6–20)
CALCIUM: 9.7 mg/dL (ref 8.9–10.3)
CO2: 21 mmol/L — AB (ref 22–32)
Chloride: 103 mmol/L (ref 101–111)
Creatinine, Ser: 0.72 mg/dL (ref 0.44–1.00)
GFR calc non Af Amer: >60 mL/min (ref >60)
Glucose, Bld: 209 mg/dL — ABNORMAL HIGH (ref 65–99)
Potassium: 3.8 mmol/L (ref 3.5–5.1)
Sodium: 136 mmol/L (ref 135–145)

## 2017-07-23 LAB — TROPONIN I: Troponin I: <0.03 ng/mL (ref <0.03)

## 2017-07-23 LAB — D-DIMER, QUANTITATIVE (NOT AT ARMC)

## 2017-07-23 MED ORDER — IPRATROPIUM-ALBUTEROL 0.5-2.5 (3) MG/3ML IN SOLN
3.0000 mL | Freq: Once | RESPIRATORY_TRACT | Status: AC
  Administered 2017-07-23: 3 mL via RESPIRATORY_TRACT
  Filled 2017-07-23: qty 3

## 2017-07-23 MED ORDER — ALBUTEROL SULFATE (2.5 MG/3ML) 0.083% IN NEBU
2.5000 mg | INHALATION_SOLUTION | Freq: Once | RESPIRATORY_TRACT | Status: AC
  Administered 2017-07-23: 2.5 mg via RESPIRATORY_TRACT
  Filled 2017-07-23: qty 3

## 2017-07-23 MED ORDER — ALBUTEROL (5 MG/ML) CONTINUOUS INHALATION SOLN
10.0000 mg/h | INHALATION_SOLUTION | Freq: Once | RESPIRATORY_TRACT | Status: AC
  Administered 2017-07-23: 10 mg/h via RESPIRATORY_TRACT
  Filled 2017-07-23: qty 20

## 2017-07-23 MED ORDER — DM-GUAIFENESIN ER 30-600 MG PO TB12: 1.0000 | ORAL_TABLET | Freq: Two times a day (BID) | ORAL | 0 refills | Status: DC

## 2017-07-23 MED ORDER — SODIUM CHLORIDE 0.9 % IV BOLUS
1000.0000 mL | Freq: Once | INTRAVENOUS | Status: AC
  Administered 2017-07-23: 1000 mL via INTRAVENOUS

## 2017-07-23 NOTE — ED Provider Notes (Signed)
EKG Interpretation  Date/Time:  Monday July 23 2017 15:25:06 EDT Ventricular Rate:  89 PR Interval:    QRS Duration: 81 QT Interval:  351 QTC Calculation: 427 R Axis:   87 Text Interpretation:  Sinus rhythm Probable left atrial enlargement No significant change since last tracing Confirmed by Doug SouJacubowitz, Bowe Sidor [phone]([id number]) on 07/23/2017 10:26:48 PM        Doug SouJacubowitz, Tsuyako Jolley, MD 07/23/17 [id number]

## 2017-07-23 NOTE — Telephone Encounter (Signed)
Fever,pain and wheezing. Patient had tried to go to work today and could not stay- she had to leave. She had chest tightening and wheezing. Patient states she has heaviness is her chest- she just doesn't feel good. Call to PCP- no appointments available- Advised patient to go to ED/UC for evaluation of her breathing.  Reason for Disposition . [1] Difficulty breathing AND [2] not severe AND [3] not from stuffy nose (e.g., not relieved by cleaning out the nose)  Answer Assessment - Initial Assessment Questions 1. WORST SYMPTOM: "What is your worst symptom?" (e.g., cough, runny nose, muscle aches, headache, sore throat, fever)      Breathing- feels she gets SOB 2. ONSET: "When did your flu symptoms start?"      Patient diagnosed pneumonia last monday  3. COUGH: "How bad is the cough?"       Patient is coughing- but not as bad as previous 4. RESPIRATORY DISTRESS: "Describe your breathing."      If patient concentrates she is ok- if she gets SOB if she is not focused on breathing 5. FEVER: "Do you have a fever?" If so, ask: "What is your temperature, how was it measured, and when did it start?"     99.3 6. EXPOSURE: "Were you exposed to someone with influenza?"       No flu 7. FLU VACCINE: "Did you get a flu shot this year?"     no 8. HIGH RISK DISEASE: "Do you any chronic medical problems?" (e.g., heart or lung disease, asthma, weak immune system, or other HIGH RISK conditions)     Hx sarcodosis  9. PREGNANCY: "Is there any chance you are pregnant?" "When was your last menstrual period?"     LMP- 06/27/17 10. OTHER SYMPTOMS: "Do you have any other symptoms?"  (e.g., runny nose, muscle aches, headache, sore throat)       Heaviness on chest  Protocols used: INFLUENZA - SEASONAL-A-AH

## 2017-07-23 NOTE — ED Triage Notes (Addendum)
Patient reports shortness of breath-diagnosed with pneumonia 1 week ago.  Reports finished abx on Saturday.  Reports breathing treatments at doctors office over the past week with some relief.

## 2017-07-23 NOTE — Telephone Encounter (Signed)
Copied from CRM [id number]. Topic: Inquiry >> Jul 23, 2017 11:07 AM Alexander BergeronBarksdale, Harvey B wrote: Reason for CRM: pt contacted to speak w/ Barnett AbuWiseman about her condition to see what she can do, pt declined to speak w/ nurse triage and wants to speak w/ Barnett AbuWiseman, contact pt to advise

## 2017-07-23 NOTE — Discharge Instructions (Signed)
Please continue steroids prescribed to you Call your doctor's office in the morning Try Mucinex-DM (cough medicine) twice daily. You can't stop it if it's not working. Return if worsening

## 2017-07-23 NOTE — Telephone Encounter (Signed)
Returned pt's call. No answer. Voicemail was full. It appears that she is at ED. If she contacts us back, I am happy to talk with her.

## 2017-07-23 NOTE — ED Notes (Signed)
Patient transported to X-ray 

## 2017-07-23 NOTE — ED Notes (Signed)
Pt ambulated half of the nurses station. SpO2 remained 99%, HR rose to 120. Pt needed to stop several times to catch her breath and reported feeling extremely SOB. Pt assisted back to room and on to stretcher.

## 2017-07-23 NOTE — ED Provider Notes (Signed)
[facility] EMERGENCY DEPARTMENT Provider Note   CSN: [id number] Arrival date & time: 07/23/17  1415     History   Chief Complaint Chief Complaint  Patient presents with  . Shortness of Breath    HPI Shelly [name] is a 42 y.o. female who presents with shortness of breath.  Past medical history significant for allergies, sarcoidosis, history of TIA, tobacco abuse.  She states that her symptoms started with a sinus infection several weeks ago.  It seemed to settle in her chest and she started to have shortness of breath so she went to her doctor's office one week ago.  Chest x-ray was done which showed a right lung base pneumonia. Flu testing was negative. She was started on Levaquin and given steroids which she has been taking throughout the week.  She did not feel like this made her feel much better.  She has gone back to her doctor's office 3 times for breathing treatments.  She has an inhaler at home but states that this does not provide any relief.  Yesterday she felt somewhat better and was able to do some chores at home.  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  She try to go to her doctor's office but they were not able to fit her and so she decided to come to the emergency department.  She reports associated chest heaviness and tightness.  She reports a fever although it has only been as high as 99.6.  She has a productive cough.  No recent surgery/travel/immobilization, hx of cancer, leg swelling, hemptysis, prior DVT/PE, or hormone use.   HPI  Past Medical History:  Diagnosis Date  . Allergy   . Anemia   . Anxiety   . Arthritis   . Depression   . GERD (gastroesophageal reflux disease)   . Migraines   . Miscarriage   . PFO (patent foramen ovale)   . Sarcoidosis   . Stroke (HCC)   . TIA (transient ischemic attack)     Patient Active Problem List   Diagnosis Date Noted  . Acute periodontal  abscess 09/07/2016  . Depression 09/07/2016  . 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 FO (foramen ovale) 09/09/2014  . Smoking addiction 06/05/2014  . Persistent asthma 06/05/2014  . GERD without esophagitis 06/05/2014  . SARCOIDOSIS 04/05/2007  . ARTHRITIS 04/05/2007    Past Surgical History:  Procedure Laterality Date  . BRONCHOSCOPY    . CESAREAN SECTION     miscarriage  . CHOLECYSTECTOMY    . HAND SURGERY    . miscarriage  08/10/14  . NASAL SINUS SURGERY       OB History   None      Home Medications    Prior to Admission medications   Medication Sig Start Date End Date Taking? Authorizing Provider  albuterol (PROVENTIL HFA;VENTOLIN HFA) 108 (90 Base) MCG/ACT inhaler Inhale 1-2 puffs into the lungs every 4 (four) hours as needed. 07/16/17   Benjiman Core D, PA-C  aspirin (GOODSENSE ASPIRIN) 325 MG tablet Take 325 mg by mouth.    [provider]  cetirizine (ZYRTEC) 10 MG tablet Take 10 mg by mouth daily.    [provider]  fluticasone (FLONASE) 50 MCG/ACT nasal spray Place 2 sprays into both nostrils daily. 01/20/16   McVey, Madelaine Bhat, PA-C  fluticasone furoate-vilanterol (BREO ELLIPTA) 200-25 MCG/INH  AEPB Inhale 1 puff into the lungs daily. 03/27/16   Bing Neighbors, FNP  predniSONE (DELTASONE) 20 MG tablet Take 3 PO QAM x3days, 2 PO QAM x3days, 1 PO QAM x3days 07/19/17   Benjiman Core D, PA-C  venlafaxine XR (EFFEXOR-XR) 150 MG 24 hr capsule Take 150 mg by mouth daily with breakfast.    [provider]  venlafaxine XR (EFFEXOR-XR) 75 MG 24 hr capsule Take 75 mg by mouth. 08/22/16   [provider]    Family History Family History  Problem Relation Age of Onset  . Osteoporosis Mother   . Asthma Mother   . Diabetes Father   . Heart disease Father   . Kidney disease Maternal Grandmother   . Heart disease Maternal Grandfather   . Cancer  Paternal Grandmother   . Heart disease Paternal Grandfather     Social History Social History   Tobacco Use  . Smoking status: Current Every Day Smoker    Packs/day: 1.00    Years: 26.00    Pack years: 26.00    Types: Cigarettes  . Smokeless tobacco: Never Used  Substance Use Topics  . Alcohol use: No    Alcohol/week: 0.0 oz  . Drug use: No     Allergies   Clindamycin/lincomycin; Keflex [cephalexin]; Penicillins; and Sulfonamide derivatives   Review of Systems Review of Systems  Constitutional: Positive for appetite change, fatigue and fever.  HENT: Negative for congestion, rhinorrhea, sinus pressure and sore throat.   Respiratory: Positive for cough, chest tightness, shortness of breath and wheezing.   Cardiovascular: Negative for chest pain, palpitations and leg swelling.  Gastrointestinal: Negative for abdominal pain, nausea and vomiting.  Genitourinary: Negative for dysuria.  All other systems reviewed and are negative.    Physical Exam Updated Vital Signs BP 119/77 (BP Location: Left Arm)   Pulse (!) 115   Temp 98.5 F (36.9 C) (Oral)   Resp 18   Ht 5' 5.5" (1.664 m)   Wt 88 kg (194 lb)   LMP 06/27/2017 (Exact Date)   SpO2 98%   BMI 31.79 kg/m   Physical Exam  Constitutional: She is oriented to person, place, and time. She appears well-developed and well-nourished. No distress.  Calm, cooperative, no acute distress.  Appears fatigued  HENT:  Head: Normocephalic and atraumatic.  Right Ear: Hearing, tympanic membrane, external ear and ear canal normal.  Left Ear: Hearing, tympanic membrane, external ear and ear canal normal.  Nose: Nose normal.  Mouth/Throat: Uvula is midline and oropharynx is clear and moist. Mucous membranes are dry (Mildly dry).  Eyes: Pupils are equal, round, and reactive to light. Conjunctivae are normal. Right eye exhibits no discharge. Left eye exhibits no discharge. No scleral icterus.  Neck: Normal range of motion.    Cardiovascular: Regular rhythm. Tachycardia present. Exam reveals no gallop and no friction rub.  No murmur heard. Pulmonary/Chest: Effort normal. No stridor. No respiratory distress. She has wheezes (Diffuse expiratory wheezes). She has no rales. She exhibits no tenderness.  Abdominal: Soft. Bowel sounds are normal. She exhibits no distension. There is no tenderness.  Musculoskeletal:  No leg swelling or tenderness  Neurological: She is alert and oriented to person, place, and time.  Skin: Skin is warm and dry.  Psychiatric: She has a normal mood and affect. Her behavior is normal.  Nursing note and vitals reviewed.    ED Treatments / Results  Labs (all labs ordered are listed, but only abnormal results are displayed) Labs Reviewed  BASIC METABOLIC PANEL - Abnormal; Notable for the following components:      Result Value   CO2 21 (*)    Glucose, Bld 209 (*)    All other components within normal limits  CBC WITH DIFFERENTIAL/PLATELET - Abnormal; Notable for the following components:   WBC 15.9 (*)    Platelets 401 (*)    Neutro Abs 12.8 (*)    All other components within normal limits  D-DIMER, QUANTITATIVE (NOT AT [facility])  TROPONIN I    EKG None  Radiology Dg Chest 2 View  Result Date: 07/23/2017 CLINICAL DATA:  Shortness of Breath EXAM: CHEST - 2 VIEW COMPARISON:  07/16/2017, 03/23/2016 FINDINGS: Cardiac shadow is stable. Cardiac shadow is within normal limits. The lungs are well aerated bilaterally. Scarring is again seen in the right lung base stable from multiple previous exams change back to 03/23/2016 filtrate or effusion is noted. No bony abnormality is seen. IMPRESSION: No active cardiopulmonary disease. Electronically Signed   By: Alcide CleverMark  Lukens M.D.   On: 07/23/2017 15:28    Procedures Procedures (including critical care time)  Medications Ordered in ED Medications  ipratropium-albuterol (DUONEB) 0.5-2.5 (3) MG/3ML nebulizer solution 3 mL (3 mLs Nebulization Given  07/23/17 1435)  albuterol (PROVENTIL) (2.5 MG/3ML) 0.083% nebulizer solution 2.5 mg (2.5 mg Nebulization Given 07/23/17 1435)  ipratropium-albuterol (DUONEB) 0.5-2.5 (3) MG/3ML nebulizer solution 3 mL (3 mLs Nebulization Given 07/23/17 1526)  sodium chloride 0.9 % bolus 1,000 mL (0 mLs Intravenous Stopped 07/23/17 1709)  albuterol (PROVENTIL,VENTOLIN) solution continuous neb (10 mg/hr Nebulization Given 07/23/17 1655)     Initial Impression / Assessment and Plan / ED Course  I have reviewed the triage vital signs and the nursing notes.  Pertinent labs & imaging results that were available during my care of the patient were reviewed by me and considered in my medical decision making (see chart for details).  42 year old female presents with SOB, cough, wheezing. Exam is consistent with asthma vs COPD exacerbation. She is likely having difficulty recovering due to her sarcoidosis and current tobacco use. She is mildly tachycardic and is hypertensive at times but otherwise vitals are normal. Lungs exam is remarkable for diffuse wheezing and rhonchi. She has a coarse cough which makes her very SOB when she coughs. Will order another breathing tx, labs, CXR, EKG.  4:32 PM After 2nd breathing tx she reports feeling somewhat better. She is now having a burning chest pain at the bottom of her chest. Lung exam reveals she is still having diffuse expiratory wheezes. Will order CAT. She states she is worried because she feels so lightheaded when she coughs and her head hurts.  6:55 PM She feels much better after the CAT. Troponin and d-dimer are negative. Will d/c with strict return precautions. Will rx cough medicine.  Final Clinical Impressions(s) / ED Diagnoses   Final diagnoses:  Bronchitis    ED Discharge Orders    None       Bethel BornGekas, Rocio Wolak Marie, PA-C 07/23/17 2055    Doug SouJacubowitz, Sam, MD 07/23/17 2322

## 2017-07-24 ENCOUNTER — Ambulatory Visit: Payer: BC Managed Care – PPO | Admitting: Urgent Care

## 2017-07-27 ENCOUNTER — Encounter: Payer: Self-pay | Admitting: Physician Assistant

## 2017-08-30 ENCOUNTER — Ambulatory Visit: Payer: Self-pay | Admitting: *Deleted

## 2017-08-30 ENCOUNTER — Encounter: Payer: Self-pay | Admitting: Physician Assistant

## 2017-08-30 NOTE — Telephone Encounter (Signed)
The adverse effects of having missed the dose should resolve, usually within a day.  Continue the medication daily, and if the effects do not resolve in the next week, or if they worsen, come in for additional evaluation.

## 2017-08-30 NOTE — Telephone Encounter (Signed)
Pt being seen in office now.

## 2017-08-30 NOTE — Telephone Encounter (Signed)
This encounter was created in error - please disregard.

## 2017-08-30 NOTE — Telephone Encounter (Signed)
Spoke to pt.  Gave her the message from [place].   She hung up as I was asking if she was feeling better.

## 2017-08-30 NOTE — Telephone Encounter (Signed)
ERROR - phone call, pt not being seen in office today. Message given to Chelle.

## 2017-08-30 NOTE — Telephone Encounter (Signed)
Pt missed taking her Effexor yesterday and now feeling the effects of missing that dose. She describes some symptoms that she is having from not taking her med.  She is going to work but would like a call back regarding what else she needs to do. She did take her usual dose this morning. Will notify flow at [facility]  Reason for Disposition . Caller has URGENT medication question about med that PCP prescribed and triager unable to answer question  Answer Assessment - Initial Assessment Questions 1. SYMPTOMS: "Do you have any symptoms?"     Anxiousness, on the verge of tears, nauseous, shakey 2. SEVERITY: If symptoms are present, ask "Are they mild, moderate or severe?"     Moderate to severe  Protocols used: MEDICATION QUESTION CALL-A-AH

## 2017-09-25 DIAGNOSIS — R102 Pelvic and perineal pain: Secondary | ICD-10-CM | POA: Insufficient documentation

## 2017-10-24 DIAGNOSIS — Z9889 Other specified postprocedural states: Secondary | ICD-10-CM | POA: Insufficient documentation

## 2017-10-30 DIAGNOSIS — N9489 Other specified conditions associated with female genital organs and menstrual cycle: Secondary | ICD-10-CM | POA: Insufficient documentation

## 2017-11-09 ENCOUNTER — Institutional Professional Consult (permissible substitution): Payer: BC Managed Care – PPO | Admitting: Internal Medicine

## 2017-11-20 ENCOUNTER — Other Ambulatory Visit: Payer: Self-pay | Admitting: Emergency Medicine

## 2017-11-20 DIAGNOSIS — F329 Major depressive disorder, single episode, unspecified: Secondary | ICD-10-CM

## 2017-11-20 DIAGNOSIS — F32A Depression, unspecified: Secondary | ICD-10-CM

## 2017-11-23 ENCOUNTER — Other Ambulatory Visit: Payer: Self-pay | Admitting: Emergency Medicine

## 2017-11-23 DIAGNOSIS — F32A Depression, unspecified: Secondary | ICD-10-CM

## 2017-11-23 DIAGNOSIS — F329 Major depressive disorder, single episode, unspecified: Secondary | ICD-10-CM

## 2017-11-26 ENCOUNTER — Ambulatory Visit: Payer: BC Managed Care – PPO | Admitting: Physician Assistant

## 2017-11-27 IMAGING — DX DG CHEST 2V
2 series · 2 of 2 positions shown · non-contrast
Comparison: March 02, 2015

CLINICAL DATA: Chest tenderness and shortness of breath. Cough for
3 days.

EXAM:
CHEST  2 VIEW

[chest pa]
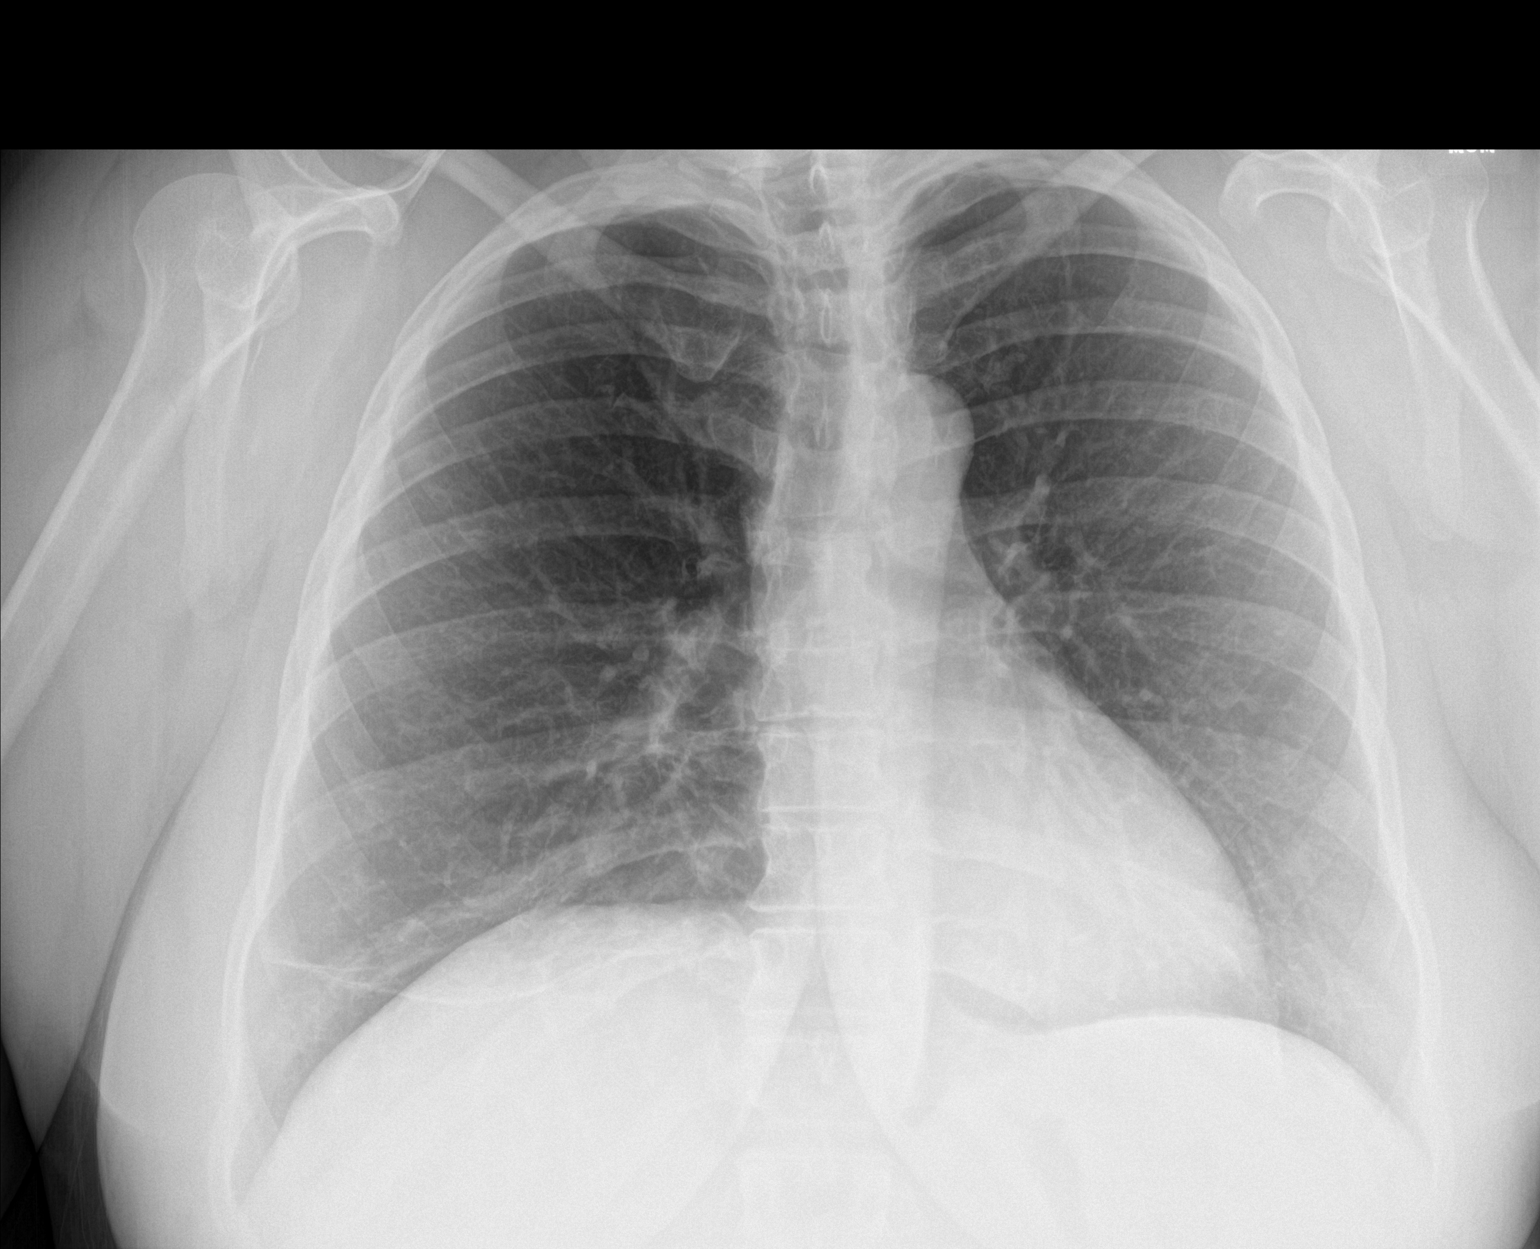

[chest lat]
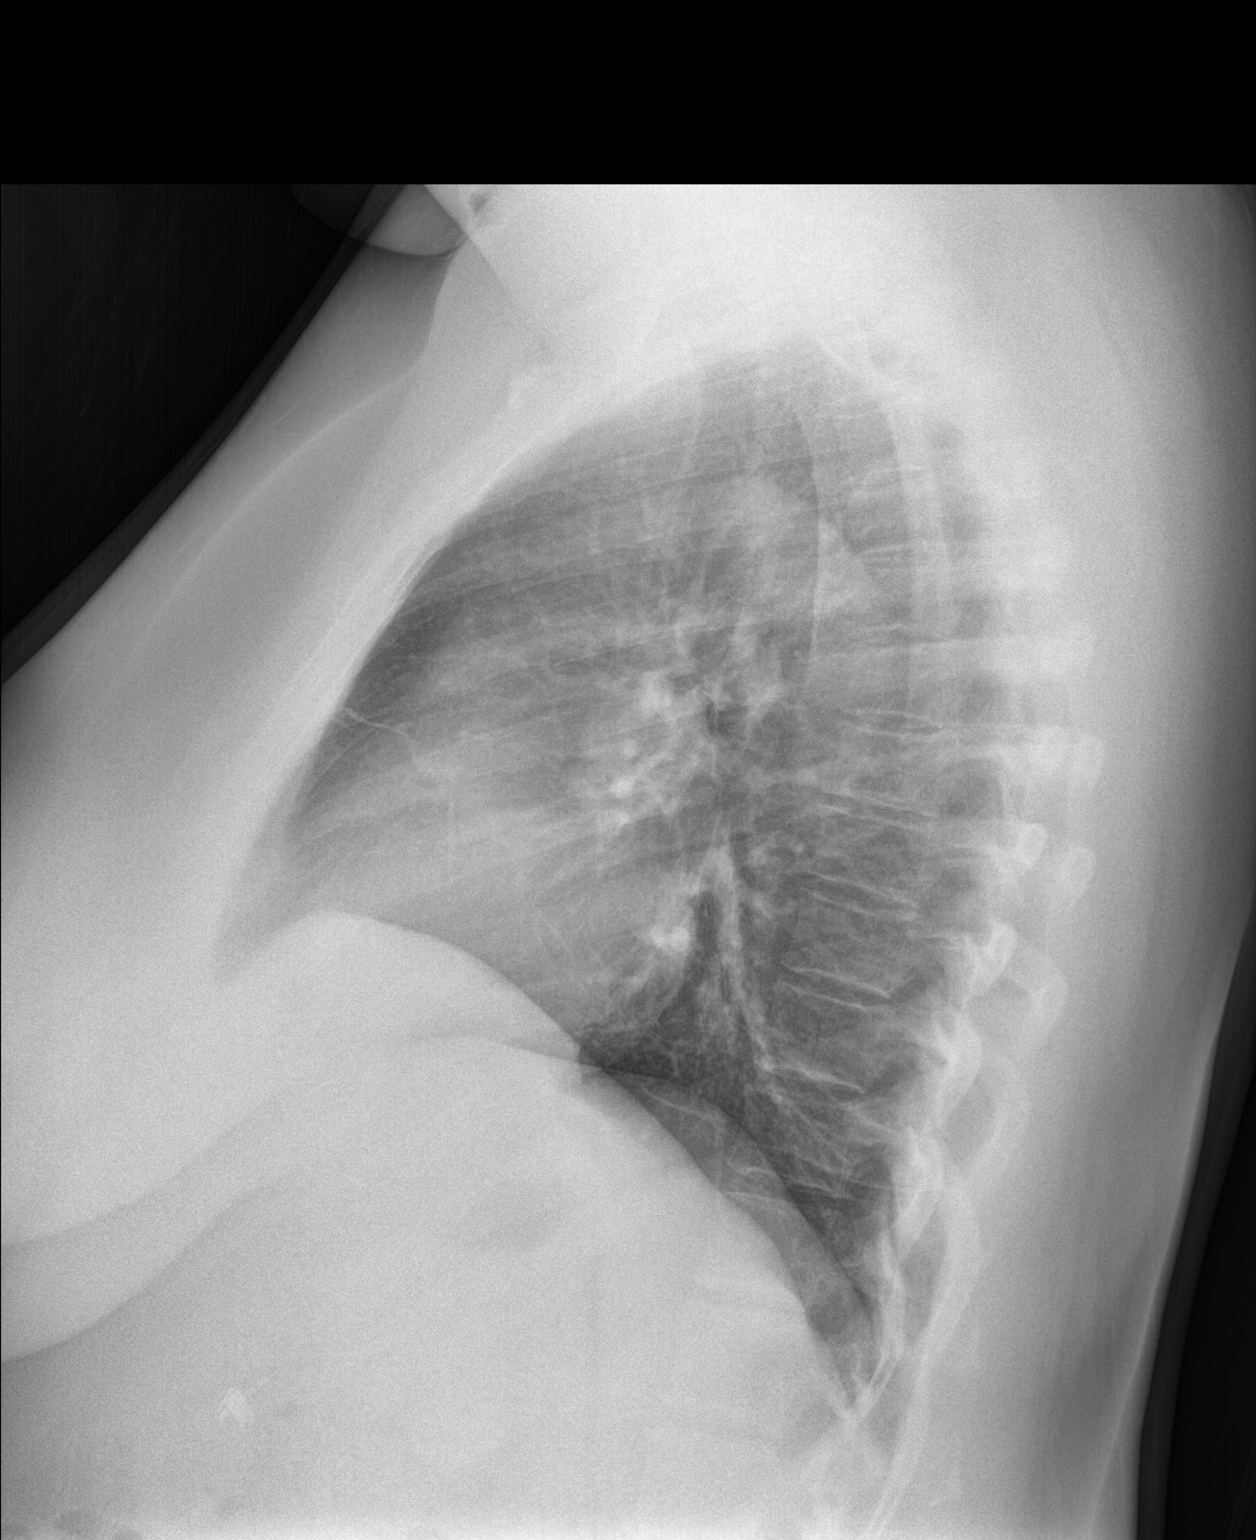

[2 of 2 positions shown; findings below may reference images not displayed]

FINDINGS: Scarring in the right base. No pneumothorax. The heart, hila,
mediastinum, lungs, and pleura are otherwise unchanged and
unremarkable.
IMPRESSION: No active cardiopulmonary disease.

## 2017-12-01 IMAGING — DX DG CHEST 2V
2 series · 2 of 2 positions shown · non-contrast
Comparison: PA and lateral chest x-ray March 23, 2016

CLINICAL DATA: Abnormal chest exam over the mid right lung. Patient
porch couple right-sided chest and middle back pain. History of
asthma, sarcoidosis, current smoker.

EXAM:
CHEST  2 VIEW

[chest lat]
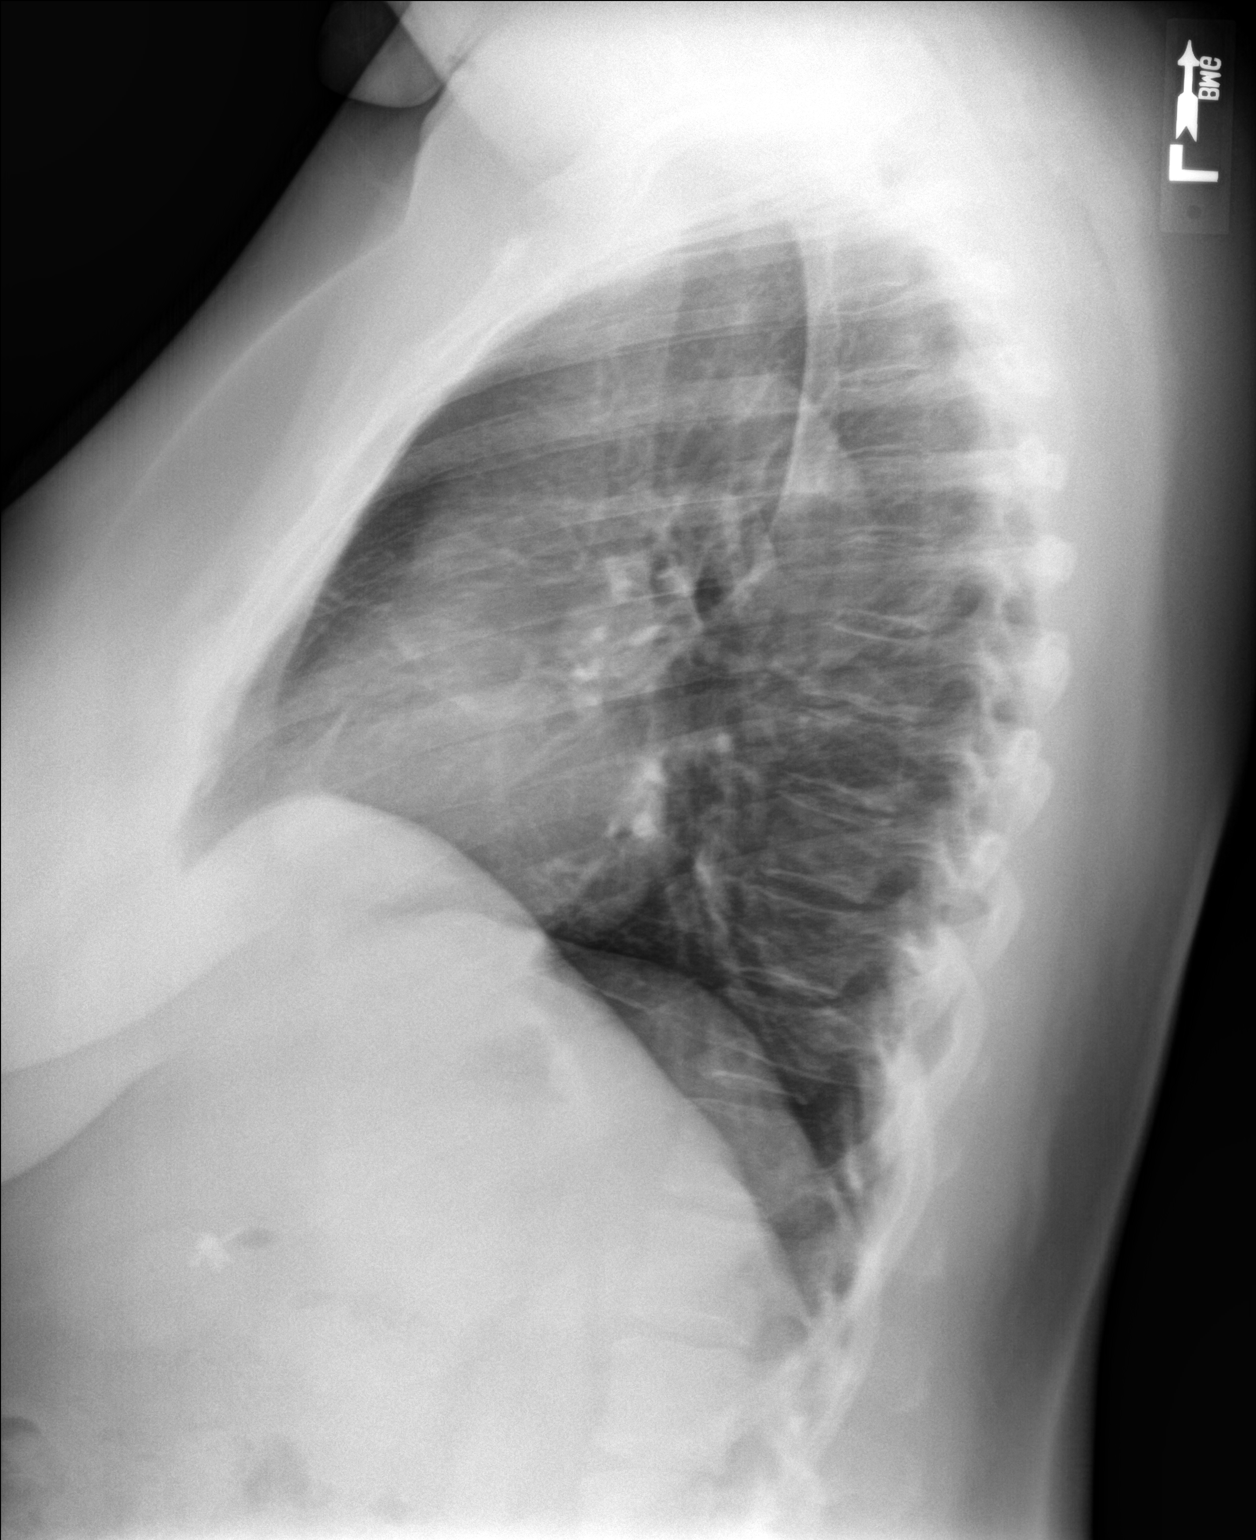

[chest pa]
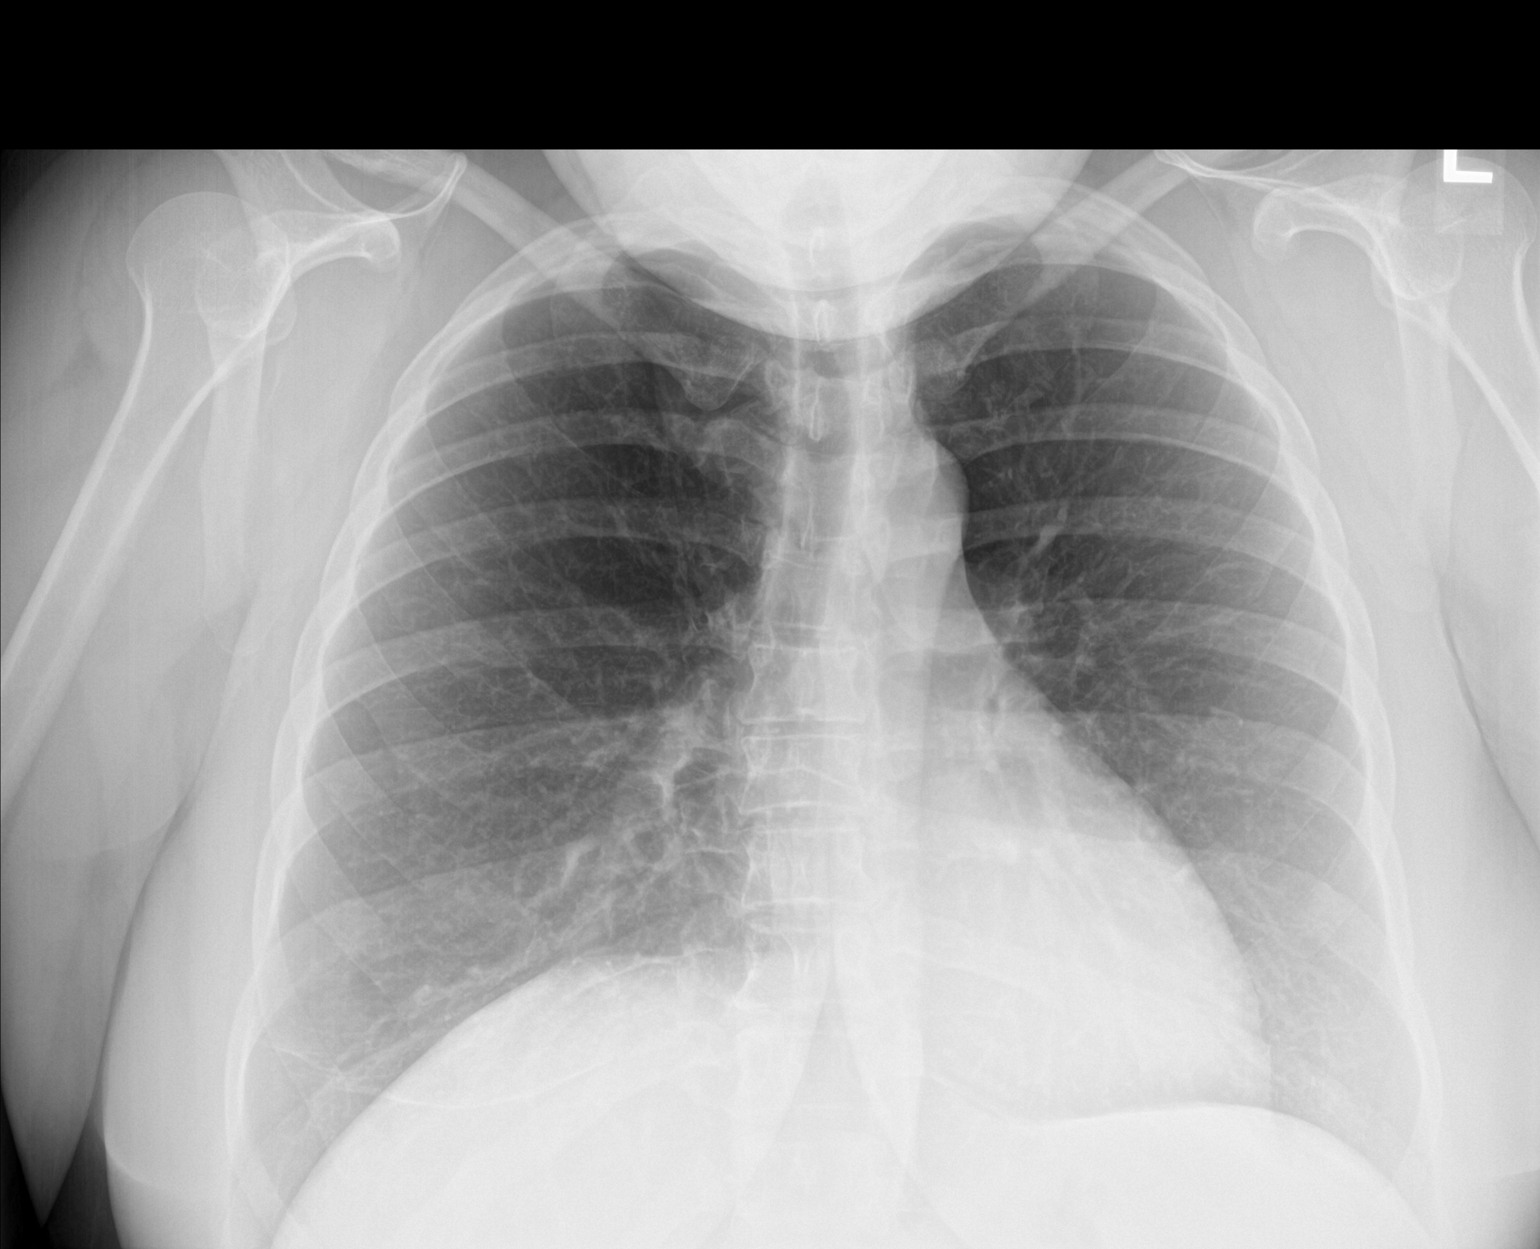

[2 of 2 positions shown; findings below may reference images not displayed]

FINDINGS: The lungs are adequately inflated. There is no focal infiltrate.
There is no pleural effusion. There is stable right basilar
scarring. The heart and pulmonary vascularity are normal. The
mediastinum is normal in width. There is no pleural effusion. The
bony thorax exhibits no acute abnormality.
IMPRESSION: There is no acute cardiopulmonary abnormality.

## 2017-12-07 ENCOUNTER — Institutional Professional Consult (permissible substitution): Payer: BC Managed Care – PPO | Admitting: Internal Medicine

## 2017-12-31 ENCOUNTER — Ambulatory Visit: Payer: Self-pay | Admitting: *Deleted

## 2017-12-31 NOTE — Telephone Encounter (Signed)
Pt calling with complaints of lungs hurting with coughing and taking a deep breath. Pt also having complaints of having a sore throat since Friday, runny nose with yellow-green discharge, cough and feeling of head being congested. Pt states that she did have a fever of 101.0 today. Pt states that she has used her inhaler with no improvement and has been taking Advil cold and sinus with little improvement of symptoms. Pt states she also used her mother's breathing treatment and did have some relief of symptoms.Pt states that the lower part of bilateral lungs hurt with deep breathing. Pt states she has a grinding cough but is not coughing up any sputum at this time. Pt scheduled for apt on 9/17 with Dr. Alvy BimlerSagardia. Pt advised to seek care in ED if symptoms become worse before scheduled appt. Pt verbalized understanding.   Reason for Disposition . [1] MILD difficulty breathing (e.g., minimal/no SOB at rest, SOB with walking, pulse <100) AND [2] NEW-onset or WORSE than normal  Answer Assessment - Initial Assessment Questions 1. RESPIRATORY STATUS: "Describe your breathing?" (e.g., wheezing, shortness of breath, unable to speak, severe coughing)      Lungs hurt with coughing 2. ONSET: "When did this breathing problem begin?"      Sunday morning 3. PATTERN "Does the difficult breathing come and go, or has it been constant since it started?"      Comes and goes 4. SEVERITY: "How bad is your breathing?" (e.g., mild, moderate, severe)    - MILD: No SOB at rest, mild SOB with walking, speaks normally in sentences, can lay down, no retractions, pulse < 100.    - MODERATE: SOB at rest, SOB with minimal exertion and prefers to sit, cannot lie down flat, speaks in phrases, mild retractions, audible wheezing, pulse 100-120.    - SEVERE: Very SOB at rest, speaks in single words, struggling to breathe, sitting hunched forward, retractions, pulse > 120      mild 5. RECURRENT SYMPTOM: "Have you had difficulty breathing  before?" If so, ask: "When was the last time?" and "What happened that time?"      Yes history of asthma 6. CARDIAC HISTORY: "Do you have any history of heart disease?" (e.g., heart attack, angina, bypass surgery, angioplasty)      No 7. LUNG HISTORY: "Do you have any history of lung disease?"  (e.g., pulmonary embolus, asthma, emphysema)     Asthma and sarcoidosis that is not active 8. CAUSE: "What do you think is causing the breathing problem?"     unknown 9. OTHER SYMPTOMS: "Do you have any other symptoms? (e.g., dizziness, runny nose, cough, chest pain, fever)     Cough, runny nose-yellow-green, fever, ears hurting 10. PREGNANCY: "Is there any chance you are pregnant?" "When was your last menstrual period?"       No, hysterectomy in July 11. TRAVEL: "Have you traveled out of the country in the last month?" (e.g., travel history, exposures)       No  Protocols used: BREATHING DIFFICULTY-A-AH

## 2018-01-01 ENCOUNTER — Encounter: Payer: Self-pay | Admitting: Emergency Medicine

## 2018-01-01 ENCOUNTER — Ambulatory Visit: Payer: BC Managed Care – PPO | Admitting: Emergency Medicine

## 2018-01-01 ENCOUNTER — Other Ambulatory Visit: Payer: Self-pay

## 2018-01-01 VITALS — BP 107/74 | HR 101 | Temp 98.6°F | Resp 16 | Ht 64.25 in | Wt 186.4 lb

## 2018-01-01 DIAGNOSIS — R05 Cough: Secondary | ICD-10-CM | POA: Diagnosis not present

## 2018-01-01 DIAGNOSIS — J22 Unspecified acute lower respiratory infection: Secondary | ICD-10-CM | POA: Diagnosis not present

## 2018-01-01 DIAGNOSIS — Z8709 Personal history of other diseases of the respiratory system: Secondary | ICD-10-CM

## 2018-01-01 DIAGNOSIS — J302 Other seasonal allergic rhinitis: Secondary | ICD-10-CM | POA: Diagnosis not present

## 2018-01-01 DIAGNOSIS — R059 Cough, unspecified: Secondary | ICD-10-CM

## 2018-01-01 MED ORDER — FLUTICASONE FUROATE-VILANTEROL 200-25 MCG/INH IN AEPB: 1.0000 | INHALATION_SPRAY | Freq: Every day | RESPIRATORY_TRACT | 11 refills | Status: DC

## 2018-01-01 MED ORDER — FLUTICASONE PROPIONATE 50 MCG/ACT NA SUSP: 2.0000 | Freq: Every day | NASAL | 12 refills | Status: DC

## 2018-01-01 MED ORDER — AZITHROMYCIN 250 MG PO TABS: ORAL_TABLET | ORAL | 0 refills | Status: DC

## 2018-01-01 MED ORDER — PREDNISONE 20 MG PO TABS: 40.0000 mg | ORAL_TABLET | Freq: Every day | ORAL | 0 refills | Status: AC

## 2018-01-01 NOTE — Progress Notes (Signed)
Shelly [name] 42 y.o.   Chief Complaint  Patient presents with  . Cough    productive light yellow mucus started x 3 day  . Sore Throat    medication refill BREO and flonase nasal spray    HISTORY OF PRESENT ILLNESS: This is a 42 y.o. female complaining of flulike symptoms since last Friday.  Has been sick for 5 days with sore throat and productive cough.  Cough  This is a new problem. The current episode started in the past 7 days. The problem has been gradually worsening. The problem occurs every few minutes. The cough is productive of sputum. Associated symptoms include headaches, nasal congestion, a sore throat and wheezing. Pertinent negatives include no chest pain, chills, ear congestion, ear pain, fever, heartburn, hemoptysis, myalgias, rash, shortness of breath or sweats. She has tried OTC cough suppressant for the symptoms. The treatment provided no relief. Her past medical history is significant for asthma.     Prior to Admission medications   Medication Sig Start Date End Date Taking? Authorizing Provider  albuterol (PROVENTIL HFA;VENTOLIN HFA) 108 (90 Base) MCG/ACT inhaler Inhale 1-2 puffs into the lungs every 4 (four) hours as needed. 07/16/17  Yes Barnett AbuWiseman, GrenadaBrittany D, PA-C  aspirin (GOODSENSE ASPIRIN) 325 MG tablet Take 325 mg by mouth.   Yes [provider]  cetirizine (ZYRTEC) 10 MG tablet Take 10 mg by mouth daily.   Yes [provider]  estradiol (ESTRACE) 2 MG tablet Take 2 mg by mouth daily.   Yes [provider]  venlafaxine XR (EFFEXOR-XR) 150 MG 24 hr capsule Take 150 mg by mouth daily with breakfast.   Yes [provider]  venlafaxine XR (EFFEXOR-XR) 75 MG 24 hr capsule Take 75 mg by mouth. 08/22/16  Yes [provider]  dextromethorphan-guaiFENesin (MUCINEX DM) 30-600 MG 12hr tablet Take 1 tablet by mouth 2 (two) times daily. Patient not taking: Reported on 01/01/2018 07/23/17   Bethel BornGekas, Kelly Marie, PA-C  fluticasone  [facility](FLONASE) 50 MCG/ACT nasal spray Place 2 sprays into both nostrils daily. Patient not taking: Reported on 01/01/2018 01/20/16   McVey, Madelaine BhatElizabeth Whitney, PA-C  fluticasone furoate-vilanterol (BREO ELLIPTA) 200-25 MCG/INH AEPB Inhale 1 puff into the lungs daily. Patient not taking: Reported on 01/01/2018 03/27/16   Bing NeighborsHarris, Kimberly S, FNP  predniSONE (DELTASONE) 20 MG tablet Take 3 PO QAM x3days, 2 PO QAM x3days, 1 PO QAM x3days Patient not taking: Reported on 01/01/2018 07/19/17   Benjiman CoreWiseman, Brittany D, PA-C    Allergies  Allergen Reactions  . Clindamycin/Lincomycin   . Keflex [Cephalexin]   . Penicillins   . Sulfonamide Derivatives     Patient Active Problem List   Diagnosis Date Noted  . Acute periodontal abscess 09/07/2016  . Depression 09/07/2016  . 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 FO (foramen ovale) 09/09/2014  . Smoking addiction 06/05/2014  . Persistent asthma 06/05/2014  . GERD without esophagitis 06/05/2014  . SARCOIDOSIS 04/05/2007  . ARTHRITIS 04/05/2007    Past Medical History:  Diagnosis Date  . Allergy   . Anemia   . Anxiety   . Arthritis   . Depression   . GERD (gastroesophageal reflux disease)   . Migraines   . Miscarriage   . PFO (patent foramen ovale)   . Sarcoidosis   . Stroke (HCC)   . TIA (transient ischemic attack)     Past Surgical History:  Procedure Laterality Date  . BRONCHOSCOPY    .  CESAREAN SECTION     miscarriage  . CHOLECYSTECTOMY    . HAND SURGERY    . miscarriage  08/10/14  . NASAL SINUS SURGERY      Social History   Socioeconomic History  . Marital status: Married    Spouse name: Not on file  . Number of children: 1  . Years of education: Not on file  . Highest education level: Not on file  Occupational History  . Not on file  Social Needs  . Financial resource strain: Not on file  . Food insecurity:    Worry: Not on file    Inability: Not  on file  . Transportation needs:    Medical: Not on file    Non-medical: Not on file  Tobacco Use  . Smoking status: Current Every Day Smoker    Packs/day: 1.00    Years: 26.00    Pack years: 26.00    Types: Cigarettes  . Smokeless tobacco: Never Used  Substance and Sexual Activity  . Alcohol use: No    Alcohol/week: 0.0 standard drinks  . Drug use: No  . Sexual activity: Yes  Lifestyle  . Physical activity:    Days per week: Not on file    Minutes per session: Not on file  . Stress: Not on file  Relationships  . Social connections:    Talks on phone: Not on file    Gets together: Not on file    Attends religious service: Not on file    Active member of club or organization: Not on file    Attends meetings of clubs or organizations: Not on file    Relationship status: Not on file  . Intimate partner violence:    Fear of current or ex partner: Not on file    Emotionally abused: Not on file    Physically abused: Not on file    Forced sexual activity: Not on file  Other Topics Concern  . Not on file  Social History Narrative  . Not on file    Family History  Problem Relation Age of Onset  . Osteoporosis Mother   . Asthma Mother   . Diabetes Father   . Heart disease Father   . Kidney disease Maternal Grandmother   . Heart disease Maternal Grandfather   . Cancer Paternal Grandmother   . Heart disease Paternal Grandfather      Review of Systems  Constitutional: Negative for chills and fever.  HENT: Positive for congestion and sore throat. Negative for ear pain.   Eyes: Negative.   Respiratory: Positive for cough, sputum production and wheezing. Negative for hemoptysis and shortness of breath.   Cardiovascular: Negative for chest pain and palpitations.  Gastrointestinal: Negative for abdominal pain, diarrhea, heartburn, nausea and vomiting.  Genitourinary: Negative.  Negative for dysuria and hematuria.  Musculoskeletal: Negative.  Negative for back pain, myalgias  and neck pain.  Skin: Negative for rash.  Neurological: Positive for headaches.  Endo/Heme/Allergies: Negative.   All other systems reviewed and are negative.  Vitals:   01/01/18 1509  BP: 107/74  Pulse: (!) 101  Resp: 16  Temp: 98.6 F (37 C)  SpO2: 94%     Physical Exam  Constitutional: She is oriented to person, place, and time. She appears well-developed and well-nourished. She appears ill.  HENT:  Head: Normocephalic and atraumatic.  Right Ear: External ear normal.  Left Ear: External ear normal.  Nose: Nose normal.  Mouth/Throat: Uvula is midline. Posterior oropharyngeal erythema present.  No oropharyngeal exudate or tonsillar abscesses.  Eyes: Pupils are equal, round, and reactive to light. Conjunctivae and EOM are normal.  Neck: Normal range of motion. Neck supple.  Cardiovascular: Normal rate, regular rhythm and normal heart sounds.  Pulmonary/Chest: Effort normal and breath sounds normal.  Abdominal: Soft. There is no tenderness.  Musculoskeletal: Normal range of motion. She exhibits no edema or tenderness.  Lymphadenopathy:    She has no cervical adenopathy.  Neurological: She is alert and oriented to person, place, and time. No sensory deficit. She exhibits normal muscle tone.  Skin: Skin is warm and dry. Capillary refill takes less than 2 seconds.  Psychiatric: She has a normal mood and affect. Her behavior is normal.  Vitals reviewed.    ASSESSMENT & PLAN: Marilynn was seen today for cough and sore throat.  Diagnoses and all orders for this visit:  Cough  Lower resp. tract infection -     predniSONE (DELTASONE) 20 MG tablet; Take 2 tablets (40 mg total) by mouth daily with breakfast for 5 days. -     azithromycin (ZITHROMAX) 250 MG tablet; Sig as indicated  History of asthma -     fluticasone furoate-vilanterol (BREO ELLIPTA) 200-25 MCG/INH AEPB; Inhale 1 puff into the lungs daily. -     predniSONE (DELTASONE) 20 MG tablet; Take 2 tablets (40 mg total)  by mouth daily with breakfast for 5 days.  Chronic seasonal allergic rhinitis -     fluticasone (FLONASE) 50 MCG/ACT nasal spray; Place 2 sprays into both nostrils daily.    Patient Instructions       If you have lab work done today you will be contacted with your lab results within the next 2 weeks.  If you have not heard from [place] then please contact us. The fastest way to get your results is to register for My Chart.   IF you received an x-ray today, you will receive an invoice from [facility]. Please contact [facility] at [phone] with questions or concerns regarding your invoice.   IF you received labwork today, you will receive an invoice from [facility]. Please contact [facility] at [phone] with questions or concerns regarding your invoice.   Our billing staff will not be able to assist you with questions regarding bills from these companies.  You will be contacted with the lab results as soon as they are available. The fastest way to get your results is to activate your My Chart account. Instructions are located on the last page of this paperwork. If you have not heard from [place] regarding the results in 2 weeks, please contact this office.     Cough, Adult A cough helps to clear your throat and lungs. A cough may last only 2-3 weeks (acute), or it may last longer than 8 weeks (chronic). Many different things can cause a cough. A cough may be a sign of an illness or another medical condition. Follow these instructions at home:  Pay attention to any changes in your cough.  Take medicines only as told by your doctor. ? If you were prescribed an antibiotic medicine, take it as told by your doctor. Do not stop taking it even if you start to feel better. ? Talk with your doctor before you try using a cough medicine.  Drink enough fluid to keep your pee (urine) clear or pale yellow.  If the air is dry, use a cold steam vaporizer or humidifier in your  home.  Stay away from things that  make you cough at work or at home.  If your cough is worse at night, try using extra pillows to raise your head up higher while you sleep.  Do not smoke, and try not to be around smoke. If you need help quitting, ask your doctor.  Do not have caffeine.  Do not drink alcohol.  Rest as needed. Contact a doctor if:  You have new problems (symptoms).  You cough up yellow fluid (pus).  Your cough does not get better after 2-3 weeks, or your cough gets worse.  Medicine does not help your cough and you are not sleeping well.  You have pain that gets worse or pain that is not helped with medicine.  You have a fever.  You are losing weight and you do not know why.  You have night sweats. Get help right away if:  You cough up blood.  You have trouble breathing.  Your heartbeat is very fast. This information is not intended to replace advice given to you by your health care provider. Make sure you discuss any questions you have with your health care provider. Document Released: 12/15/2010 Document Revised: 09/09/2015 Document Reviewed: 06/10/2014 Elsevier Interactive Patient Education  2018 Elsevier Inc.      Edwina Barth, MD [facility]

## 2018-01-01 NOTE — Patient Instructions (Addendum)
     If you have lab work done today you will be contacted with your lab results within the next 2 weeks.  If you have not heard from us then please contact us. The fastest way to get your results is to register for My Chart.   IF you received an x-ray today, you will receive an invoice from [facility]. Please contact  Radiology at [phone] with questions or concerns regarding your invoice.   IF you received labwork today, you will receive an invoice from [facility]. Please contact [facility] at [phone] with questions or concerns regarding your invoice.   Our billing staff will not be able to assist you with questions regarding bills from these companies.  You will be contacted with the lab results as soon as they are available. The fastest way to get your results is to activate your My Chart account. Instructions are located on the last page of this paperwork. If you have not heard from us regarding the results in 2 weeks, please contact this office.     Cough, Adult A cough helps to clear your throat and lungs. A cough may last only 2-3 weeks (acute), or it may last longer than 8 weeks (chronic). Many different things can cause a cough. A cough may be a sign of an illness or another medical condition. Follow these instructions at home:  Pay attention to any changes in your cough.  Take medicines only as told by your doctor. ? If you were prescribed an antibiotic medicine, take it as told by your doctor. Do not stop taking it even if you start to feel better. ? Talk with your doctor before you try using a cough medicine.  Drink enough fluid to keep your pee (urine) clear or pale yellow.  If the air is dry, use a cold steam vaporizer or humidifier in your home.  Stay away from things that make you cough at work or at home.  If your cough is worse at night, try using extra pillows to raise your head up higher while you sleep.  Do not smoke, and try not to be  around smoke. If you need help quitting, ask your doctor.  Do not have caffeine.  Do not drink alcohol.  Rest as needed. Contact a doctor if:  You have new problems (symptoms).  You cough up yellow fluid (pus).  Your cough does not get better after 2-3 weeks, or your cough gets worse.  Medicine does not help your cough and you are not sleeping well.  You have pain that gets worse or pain that is not helped with medicine.  You have a fever.  You are losing weight and you do not know why.  You have night sweats. Get help right away if:  You cough up blood.  You have trouble breathing.  Your heartbeat is very fast. This information is not intended to replace advice given to you by your health care provider. Make sure you discuss any questions you have with your health care provider. Document Released: 12/15/2010 Document Revised: 09/09/2015 Document Reviewed: 06/10/2014 Elsevier Interactive Patient Education  2018 Elsevier Inc.  

## 2018-01-04 ENCOUNTER — Telehealth: Payer: Self-pay | Admitting: Emergency Medicine

## 2018-01-04 NOTE — Telephone Encounter (Signed)
Copied from CRM [id number]. Topic: General - Other >> Jan 04, 2018  8:00 AM Leafy Roobinson, Norma J wrote: Reason for CRM:pt saw dr sagardia on 9-17 and needs a work note from 9-17 to 01-03-18 and returning to work today 01-04-18 with out restrictions. Fax to [employer] [phone]

## 2018-01-04 NOTE — Telephone Encounter (Signed)
Copied from CRM [id number]. Topic: General - Other >> Jan 04, 2018  8:08 AM Gean BirchwoodWilliams-Neal, Sade R wrote: Updated Fax # [phone]

## 2018-01-07 ENCOUNTER — Telehealth: Payer: Self-pay | Admitting: *Deleted

## 2018-01-07 ENCOUNTER — Encounter: Payer: Self-pay | Admitting: *Deleted

## 2018-01-07 NOTE — Telephone Encounter (Signed)
pls see note 

## 2018-01-07 NOTE — Telephone Encounter (Signed)
Message left on pts voicemail. Letter for work is ready for pick up at [employer]

## 2018-01-07 NOTE — Telephone Encounter (Signed)
OK to provide work note. Thanks.

## 2018-01-08 NOTE — Telephone Encounter (Signed)
Please fax to number listed in T.E. ([phone]) if not able contact pt to come pick it up; pt is expecting letter to be faxed

## 2018-01-10 ENCOUNTER — Encounter: Payer: Self-pay | Admitting: Emergency Medicine

## 2018-05-20 ENCOUNTER — Other Ambulatory Visit: Payer: Self-pay

## 2018-05-20 ENCOUNTER — Ambulatory Visit: Payer: BC Managed Care – PPO | Admitting: Family Medicine

## 2018-05-20 ENCOUNTER — Encounter: Payer: Self-pay | Admitting: Family Medicine

## 2018-05-20 VITALS — BP 155/101 | HR 88 | Temp 99.0°F | Resp 18 | Ht 64.25 in | Wt 193.6 lb

## 2018-05-20 DIAGNOSIS — J0110 Acute frontal sinusitis, unspecified: Secondary | ICD-10-CM

## 2018-05-20 DIAGNOSIS — R030 Elevated blood-pressure reading, without diagnosis of hypertension: Secondary | ICD-10-CM

## 2018-05-20 DIAGNOSIS — Z8669 Personal history of other diseases of the nervous system and sense organs: Secondary | ICD-10-CM | POA: Diagnosis not present

## 2018-05-20 DIAGNOSIS — J302 Other seasonal allergic rhinitis: Secondary | ICD-10-CM | POA: Diagnosis not present

## 2018-05-20 MED ORDER — PROCHLORPERAZINE MALEATE 10 MG PO TABS: 10.0000 mg | ORAL_TABLET | Freq: Three times a day (TID) | ORAL | 0 refills | Status: AC | PRN

## 2018-05-20 MED ORDER — AZITHROMYCIN 250 MG PO TABS: ORAL_TABLET | ORAL | 0 refills | Status: AC

## 2018-05-20 MED ORDER — MONTELUKAST SODIUM 10 MG PO TABS: 10.0000 mg | ORAL_TABLET | Freq: Every day | ORAL | 3 refills | Status: DC

## 2018-05-20 NOTE — Progress Notes (Signed)
2/3/20203:23 PM  Shelly [name] [birth year], 43 y.o. female [id number]  Chief Complaint  Patient presents with  . Headache    started weds of last week, having some light and noise sensitivity since yesterday     HPI:   Patient is a 43 y.o. female with past medical history significant for depression, asthma, sarcoidosis, CVA who presents today for headache  Has been feeling run down, sinus issues, sluggish about 2 weeks Started allergy meds and nose spray, was getting better Then last wed, started having this dull headache And then yesterday started having intense headache, as if someone was beating her head, behind right eye Today started having intermittent dizziness, room spinning Also having light and noise sensitivity Headache worse with bending over Has h/o migraine auras wo headache Has done well with azithromycin in the past Has h/o sinus surgery   Fall Risk  05/20/2018 01/01/2018 07/16/2017 06/25/2017 02/14/2017  Falls in the past year? 1 No No No No  Number falls in past yr: 0 - - - -  Injury with Fall? 0 - - - -     Dep[facility] screen [facility] 2/9 05/20/2018 01/01/2018 07/16/2017  Decreased Interest 0 0 0  Down, Depressed, Hopeless 0 0 0  PHQ - 2 Score 0 0 0  Altered sleeping - - -  Tired, decreased energy - - -  Change in appetite - - -  Feeling bad or failure about yourself  - - -  Trouble concentrating - - -  Moving slowly or fidgety/restless - - -  Suicidal thoughts - - -  PHQ-9 Score - - -  Difficult doing work/chores - - -    Allergies  Allergen Reactions  . Gramineae Pollens Hives  . Clindamycin   . Clindamycin/Lincomycin   . Keflex [Cephalexin]   . Lincomycin Hcl   . Penicillins   . Sulfa Antibiotics Hives  . Sulfonamide Derivatives     Prior to Admission medications   Medication Sig Start Date End Date Taking? Authorizing Provider  albuterol (PROVENTIL HFA;VENTOLIN HFA) 108 (90 Base) MCG/ACT inhaler Inhale 1-2 puffs into the lungs every 4 (four)  hours as needed. 07/16/17  Yes Barnett AbuWiseman, [place]Brittany D, PA-C  aspirin (GOODSENSE ASPIRIN) 325 MG tablet Take 325 mg by mouth.   Yes [provider]  cetirizine (ZYRTEC) 10 MG tablet Take 10 mg by mouth daily.   Yes [provider]  estradiol (ESTRACE) 2 MG tablet Take 2 mg by mouth daily.   Yes [provider]  fluticasone (FLONASE) 50 MCG/ACT nasal spray Place 2 sprays into both nostrils daily. 01/01/18  Yes Georgina QuintSagardia, Miguel Jose, MD  venlafaxine XR (EFFEXOR-XR) 150 MG 24 hr capsule Take 150 mg by mouth daily with breakfast.   Yes [provider]  venlafaxine XR (EFFEXOR-XR) 75 MG 24 hr capsule Take 75 mg by mouth. 08/22/16  Yes [provider]  dextromethorphan-guaiFENesin (MUCINEX DM) 30-600 MG 12hr tablet Take 1 tablet by mouth 2 (two) times daily. Patient not taking: Reported on 01/01/2018 07/23/17   Bethel BornGekas, Kelly Marie, PA-C  fluticasone furoate-vilanterol (BREO ELLIPTA) 200-25 MCG/INH AEPB Inhale 1 puff into the lungs daily. Patient not taking: Reported on 05/20/2018 01/01/18   Georgina QuintSagardia, Miguel Jose, MD    Past Medical History:  Diagnosis Date  . Allergy   . Anemia   . Anxiety   . Arthritis   . Depression   . GERD (gastroesophageal reflux disease)   . Migraines   . Miscarriage   . PFO (  patent foramen ovale)   . Sarcoidosis   . Stroke (HCC)   . TIA (transient ischemic attack)     Past Surgical History:  Procedure Laterality Date  . BRONCHOSCOPY    . CESAREAN SECTION     miscarriage  . CHOLECYSTECTOMY    . HAND SURGERY    . miscarriage  08/10/14  . NASAL SINUS SURGERY      Social History   Tobacco Use  . Smoking status: Current Every Day Smoker    Packs/day: 1.00    Years: 26.00    Pack years: 26.00    Types: Cigarettes  . Smokeless tobacco: Never Used  Substance Use Topics  . Alcohol use: No    Alcohol/week: 0.0 standard drinks    Family History  Problem Relation Age of Onset  . Osteoporosis Mother   . Asthma Mother   .  Diabetes Father   . Heart disease Father   . Kidney disease Maternal Grandmother   . Heart disease Maternal Grandfather   . Cancer Paternal Grandmother   . Heart disease Paternal Grandfather     ROS Per hpi  OBJECTIVE:  Blood pressure (!) 155/101, pulse 88, temperature 99 F (37.2 C), temperature source Oral, resp. rate 18, height 5' 4.25" (1.632 m), weight 193 lb 9.6 oz (87.8 kg), last menstrual period 06/27/2017, SpO2 97 %. Body mass index is 32.97 kg/m.   Repeat BP 140/90  BP Readings from Last 3 Encounters:  05/20/18 (!) 155/101  01/01/18 107/74  07/23/17 125/85    Physical Exam Vitals signs and nursing note reviewed.  Constitutional:      Appearance: She is well-developed.  HENT:     Head: Normocephalic and atraumatic.     Right Ear: Hearing, tympanic membrane, ear canal and external ear normal.     Left Ear: Hearing, tympanic membrane, ear canal and external ear normal.     Nose: Mucosal edema and congestion present.     Right Sinus: Maxillary sinus tenderness and frontal sinus tenderness present.     Left Sinus: No maxillary sinus tenderness or frontal sinus tenderness.     Mouth/Throat:     Pharynx: Posterior oropharyngeal erythema present. No pharyngeal swelling or oropharyngeal exudate.  Eyes:     Conjunctiva/sclera: Conjunctivae normal.     Pupils: Pupils are equal, round, and reactive to light.  Neck:     Musculoskeletal: Neck supple.  Cardiovascular:     Rate and Rhythm: Normal rate and regular rhythm.     Heart sounds: Normal heart sounds. No murmur. No friction rub. No gallop.   Pulmonary:     Effort: Pulmonary effort is normal.     Breath sounds: Normal breath sounds. No wheezing or rales.  Lymphadenopathy:     Cervical: No cervical adenopathy.  Skin:    General: Skin is warm and dry.  Neurological:     General: No focal deficit present.     Mental Status: She is alert and oriented to person, place, and time.     ASSESSMENT and PLAN  1.  Acute non-recurrent frontal sinusitis Discussed supportive measures, new meds r/se/b and RTC precautions. Azithromycin per med allergies. Adding compazine as having dizziness and h/o migraines.   2. Elevated BP without diagnosis of hypertension Stop oral decongestant. Recheck BP nurse visit 1-2 weeks - Care order/instruction:  3. Chronic seasonal allergic rhinitis Uncontrolled. Adding singulair. Consider referral to specialist  4. Hx of migraines  Other orders - montelukast (SINGULAIR) 10 MG tablet; Take 1 tablet (  10 mg total) by mouth at bedtime. - azithromycin (ZITHROMAX) 250 MG tablet; Take 2 tablets (500 mg total) by mouth daily for 1 day, THEN 1 tablet (250 mg total) daily for 5 days. - prochlorperazine (COMPAZINE) 10 MG tablet; Take 1 tablet (10 mg total) by mouth every 8 (eight) hours as needed for nausea or vomiting.  Return if symptoms worsen or fail to improve.    Myles Lipps, MD Primary Care at [facility] [address] [place], [place] [address] Ph.  [phone] Fax [phone]

## 2018-05-20 NOTE — Patient Instructions (Signed)
° ° ° °  If you have lab work done today you will be contacted with your lab results within the next 2 weeks.  If you have not heard from us then please contact us. The fastest way to get your results is to register for My Chart. ° ° °IF you received an x-ray today, you will receive an invoice from [facility]. Please contact [facility] at [phone] with questions or concerns regarding your invoice.  ° °IF you received labwork today, you will receive an invoice from [facility]. Please contact [facility] at [phone] with questions or concerns regarding your invoice.  ° °Our billing staff will not be able to assist you with questions regarding bills from these companies. ° °You will be contacted with the lab results as soon as they are available. The fastest way to get your results is to activate your My Chart account. Instructions are located on the last page of this paperwork. If you have not heard from us regarding the results in 2 weeks, please contact this office. °  ° ° ° °

## 2018-05-22 ENCOUNTER — Ambulatory Visit: Payer: Self-pay | Admitting: *Deleted

## 2018-05-22 ENCOUNTER — Ambulatory Visit: Payer: Self-pay | Admitting: Family Medicine

## 2018-05-22 NOTE — Telephone Encounter (Signed)
Pt called with complaints of her migraine coming back after taking compazine on 05/20/2018; her headache started 05/22/2018 and is rated 3-4 out of 10; she also reports that her blood pressure was 143/100 at 1130 today; this reading was taken on her right arm by her school nurse; she has not taken anything for pain; recommendations made per nurse triage protocol; the pt normally sees Dr Alvy Bimler; she was seen on 05/20/2018 by Dr Koren Shiver;   but he has no availability today; pt states that she works in [employer], and does not get off until 1600; pt offered and accepted appointment with Dr Karyl Kinnier, [address], 05/22/2018 at 1720; she verbalized understanding. Reason for Disposition . [1] MILD-MODERATE headache AND [2] present > 72 hours  Answer Assessment - Initial Assessment Questions 1. LOCATION: "Where does it hurt?"      Right/center(dull) and over left eye (sharp) 2. ONSET: "When did the headache start?" (Minutes, hours or days)      05/22/2018 at 1030 3. PATTERN: "Does the pain come and go, or has it been constant since it started?"     Dull ache is building 4. SEVERITY: "How bad is the pain?" and "What does it keep you from doing?"  (e.g., Scale 1-10; mild, moderate, or severe)   - MILD (1-3): doesn't interfere with normal activities    - MODERATE (4-7): interferes with normal activities or awakens from sleep    - SEVERE (8-10): excruciating pain, unable to do any normal activities        3-4 out of 10 5. RECURRENT SYMPTOM: "Have you ever had headaches before?" If so, ask: "When was the last time?" and "What happened that time?"      Seen in office 05/20/2018 6. CAUSE: "What do you think is causing the headache?"     No idea 7. MIGRAINE: "Have you been diagnosed with migraine headaches?" If so, ask: "Is this headache similar?"      Migraines; migraines is aura and aftermath pain 8. HEAD INJURY: "Has there been any recent injury to the head?"      no 9. OTHER SYMPTOMS: "Do you have  any other symptoms?" (fever, stiff neck, eye pain, sore throat, cold symptoms)     Reported BP was 143/100 on 05/22/2018 at 1130 10. PREGNANCY: "Is there any chance you are pregnant?" "When was your last menstrual period?"       No hysterectomy  Protocols used: HEADACHE-A-AH

## 2018-09-10 ENCOUNTER — Telehealth: Payer: Self-pay | Admitting: Emergency Medicine

## 2018-09-10 NOTE — Telephone Encounter (Signed)
Copied from CRM [id number]. Topic: General - Inquiry >> Sep 10, 2018  3:30 PM Deborha Payment wrote: Reason for CRM: Patient called stating she had a produce done 5/22 for implant to close POS. ? She said she had it [place] in [place]([facility] [place]  Patient is having low grade fever for the past couple of days, Sunday night her chest was hurting and she feels like she might have a sinus infection.   Also: Patient called to get advise on a knot/pimple she has had on her scalp for a year now, and it is gradually getting bigger and bigger and wants PCP to see it. Patient would like PCP or nurse to give call back regarding issues.  Patient call back #  Call back # [phone]

## 2018-09-12 ENCOUNTER — Other Ambulatory Visit: Payer: Self-pay

## 2018-09-12 ENCOUNTER — Encounter: Payer: Self-pay | Admitting: Emergency Medicine

## 2018-09-12 ENCOUNTER — Telehealth (INDEPENDENT_AMBULATORY_CARE_PROVIDER_SITE_OTHER): Payer: BC Managed Care – PPO | Admitting: Emergency Medicine

## 2018-09-12 DIAGNOSIS — R05 Cough: Secondary | ICD-10-CM | POA: Diagnosis not present

## 2018-09-12 DIAGNOSIS — Z862 Personal history of diseases of the blood and blood-forming organs and certain disorders involving the immune mechanism: Secondary | ICD-10-CM | POA: Diagnosis not present

## 2018-09-12 DIAGNOSIS — J22 Unspecified acute lower respiratory infection: Secondary | ICD-10-CM | POA: Diagnosis not present

## 2018-09-12 DIAGNOSIS — R059 Cough, unspecified: Secondary | ICD-10-CM

## 2018-09-12 MED ORDER — AZITHROMYCIN 250 MG PO TABS: ORAL_TABLET | ORAL | 0 refills | Status: DC

## 2018-09-12 NOTE — Progress Notes (Signed)
CC-fever, sinus pain- Low grade fever at 99.6-100.5 for x5 days now, sinus pain worse on the left side, mucus, no body ache, little producing coughing. Had some wheezing but much better now.

## 2018-09-12 NOTE — Patient Instructions (Signed)
° ° ° °  If you have lab work done today you will be contacted with your lab results within the next 2 weeks.  If you have not heard from us then please contact us. The fastest way to get your results is to register for My Chart. ° ° °IF you received an x-ray today, you will receive an invoice from [facility]. Please contact [facility] at [phone] with questions or concerns regarding your invoice.  ° °IF you received labwork today, you will receive an invoice from [facility]. Please contact [facility] at [phone] with questions or concerns regarding your invoice.  ° °Our billing staff will not be able to assist you with questions regarding bills from these companies. ° °You will be contacted with the lab results as soon as they are available. The fastest way to get your results is to activate your My Chart account. Instructions are located on the last page of this paperwork. If you have not heard from us regarding the results in 2 weeks, please contact this office. °  ° ° ° °

## 2018-09-12 NOTE — Progress Notes (Signed)
Telemedicine Encounter- SOAP NOTE Established Patient  This telephone encounter was conducted with the patient's (or proxy's) verbal consent via audio telecommunications: yes/no: Yes Patient was instructed to have this encounter in a suitably private space; and to only have persons present to whom they give permission to participate. In addition, patient identity was confirmed by use of name plus two identifiers (DOB and address).  I discussed the limitations, risks, security and privacy concerns of performing an evaluation and management service by telephone and the availability of in person appointments. I also discussed with the patient that there may be a patient responsible charge related to this service. The patient expressed understanding and agreed to proceed.  I spent a total of TIME; 0 MIN TO 60 MIN: 15 minutes talking with the patient or their proxy.  No chief complaint on file. Fever and cough    Subjective   Shelly [name] is a 43 y.o. female established patient. Telephone visit today complaining of productive cough, fever, intermittent chest burning, left-sided sinus pressure that started 4 days ago.  Went in the hospital last Friday for procedure, PFO closure, and went home the next day.  No complications.  Next day on Sunday started feeling some burning on her chest with wheezing.  Has a history of asthma.  Used inhaler with success.  Following day developed fever with productive cough.  Has a history of sarcoidosis.  No other significant symptoms.  Denies significant shortness of breath.  HPI   Patient Active Problem List   Diagnosis Date Noted  . History of asthma 01/01/2018  . Chronic seasonal allergic rhinitis 01/01/2018  . Depression 09/07/2016  . 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 FO (foramen ovale) 09/09/2014  . Smoking addiction 06/05/2014  . Persistent asthma 06/05/2014  . GERD  without esophagitis 06/05/2014  . SARCOIDOSIS 04/05/2007  . ARTHRITIS 04/05/2007    Past Medical History:  Diagnosis Date  . Allergy   . Anemia   . Anxiety   . Arthritis   . Depression   . GERD (gastroesophageal reflux disease)   . Migraines   . Miscarriage   . PFO (patent foramen ovale)   . Sarcoidosis   . Stroke (HCC)   . TIA (transient ischemic attack)     Current Outpatient Medications  Medication Sig Dispense Refill  . albuterol (PROVENTIL HFA;VENTOLIN HFA) 108 (90 Base) MCG/ACT inhaler Inhale 1-2 puffs into the lungs every 4 (four) hours as needed. 6.7 g 0  . aspirin (GOODSENSE ASPIRIN) 325 MG tablet Take 325 mg by mouth.    Marland Kitchen atorvastatin (LIPITOR) 20 MG tablet Take 20 mg by mouth daily.    . cetirizine (ZYRTEC) 10 MG tablet Take 10 mg by mouth daily.    . clopidogrel (PLAVIX) 75 MG tablet Take 75 mg by mouth daily.    Marland Kitchen estradiol (ESTRACE) 2 MG tablet Take 2 mg by mouth daily.    . fluticasone (FLONASE) 50 MCG/ACT nasal spray Place 2 sprays into both nostrils daily. 16 g 12  . fluticasone furoate-vilanterol (BREO ELLIPTA) 200-25 MCG/INH AEPB Inhale 1 puff into the lungs daily. 1 each 11  . montelukast (SINGULAIR) 10 MG tablet Take 1 tablet (10 mg total) by mouth at bedtime. 30 tablet 3  . prochlorperazine (COMPAZINE) 10 MG tablet Take 1 tablet (10 mg total) by mouth every 8 (eight) hours as needed for nausea or vomiting. 20 tablet 0  . venlafaxine XR (EFFEXOR-XR)  150 MG 24 hr capsule Take 150 mg by mouth daily with breakfast.    . venlafaxine XR (EFFEXOR-XR) 75 MG 24 hr capsule Take 75 mg by mouth.     No current facility-administered medications for this visit.     Allergies  Allergen Reactions  . Gramineae Pollens Hives  . Clindamycin   . Clindamycin/Lincomycin   . Keflex [Cephalexin]   . Lincomycin Hcl   . Penicillins   . Sulfa Antibiotics Hives  . Sulfonamide Derivatives     Social History   Socioeconomic History  . Marital status: Married    Spouse  name: Not on file  . Number of children: 1  . Years of education: Not on file  . Highest education level: Not on file  Occupational History  . Not on file  Social Needs  . Financial resource strain: Not on file  . Food insecurity:    Worry: Not on file    Inability: Not on file  . Transportation needs:    Medical: Not on file    Non-medical: Not on file  Tobacco Use  . Smoking status: Current Every Day Smoker    Packs/day: 1.00    Years: 26.00    Pack years: 26.00    Types: Cigarettes  . Smokeless tobacco: Never Used  Substance and Sexual Activity  . Alcohol use: No    Alcohol/week: 0.0 standard drinks  . Drug use: No  . Sexual activity: Yes  Lifestyle  . Physical activity:    Days per week: Not on file    Minutes per session: Not on file  . Stress: Not on file  Relationships  . Social connections:    Talks on phone: Not on file    Gets together: Not on file    Attends religious service: Not on file    Active member of club or organization: Not on file    Attends meetings of clubs or organizations: Not on file    Relationship status: Not on file  . Intimate partner violence:    Fear of current or ex partner: Not on file    Emotionally abused: Not on file    Physically abused: Not on file    Forced sexual activity: Not on file  Other Topics Concern  . Not on file  Social History Narrative  . Not on file    Review of Systems  Constitutional: Positive for chills and fever.  HENT: Positive for congestion and sinus pain. Negative for sore throat.   Eyes: Negative.   Respiratory: Positive for cough, sputum production and wheezing. Negative for shortness of breath.   Cardiovascular: Positive for chest pain. Negative for palpitations.  Gastrointestinal: Negative.  Negative for abdominal pain, diarrhea, nausea and vomiting.  Genitourinary: Negative for dysuria.  Musculoskeletal: Negative for myalgias.  Skin: Negative for rash.  Neurological: Negative for dizziness  and headaches.  Endo/Heme/Allergies: Negative.   All other systems reviewed and are negative.   Objective   Vitals as reported by the patient: There were no vitals filed for this visit. Awake and oriented x3 in no apparent respiratory distress while talking to me on the phone.  No coughing. There are no diagnoses linked to this encounter. Diagnoses and all orders for this visit:  Lower respiratory infection -     azithromycin (ZITHROMAX) 250 MG tablet; Sig as indicated  Cough  History of sarcoidosis    Clinically stable.  COVID-19 infection is a possibility.  ED precautions given. Rest, hydration, nutrition  stressed to the patient.  Symptom control with cough medicine recommended. Start azithromycin for possible bacterial infection. Monitor symptoms and notify the office if clinical picture worsens.  I discussed the assessment and treatment plan with the patient. The patient was provided an opportunity to ask questions and all were answered. The patient agreed with the plan and demonstrated an understanding of the instructions.   The patient was advised to call back or seek an in-person evaluation if the symptoms worsen or if the condition fails to improve as anticipated.  I provided 15 minutes of non-face-to-face time during this encounter.  Georgina Quint, MD  Primary Care at [facility]

## 2018-09-13 NOTE — Telephone Encounter (Signed)
Pt had a telemed with her PCP on 09/12/2018 and her concerns were addressed at the visit.

## 2018-09-17 ENCOUNTER — Ambulatory Visit: Payer: Self-pay | Admitting: Emergency Medicine

## 2018-09-17 NOTE — Telephone Encounter (Signed)
Pt called regarding a low grade fever and other symptoms that she is experiencing since the 24 th of May. Her temp this morning temp is 99.5.  She thinks her normal temp is 98. She is also reporting feeling sluggish, has a cough and has had some chest heaviness on the 24th.  She has some shortness of breath with exertion. Advised pt that if she starts having respiratory distress she needs to call 911. Pt voiced understanding. Per protocol she should have a visit. Pt advised to having a virtual visit, with verbal understanding. Notified PCP for an appointment. Call transferred to the practice.  Reason for Disposition . Fever present > 3 days (72 hours)  Answer Assessment - Initial Assessment Questions 1. TEMPERATURE: "What is the most recent temperature?"  "How was it measured?"      99.5 2. ONSET: "When did the fever start?"     Since the 24 th of May2 3. SYMPTOMS: "Do you have any other symptoms besides the fever?"  (e.g., colds, headache, sore throat, earache, cough, rash, diarrhea, vomiting, abdominal pain)     Cough, shortness of breath, feels sluggish, sob with exertion 4. CAUSE: If there are no symptoms, ask: "What do you think is causing the fever?"      Not sure 5. CONTACTS: "Does anyone else in the family have an infection?"     no 6. TREATMENT: "What have you done so far to treat this fever?" (e.g., medications)     Drinking lots of fluids 7. IMMUNOCOMPROMISE: "Do you have of the following: diabetes, HIV positive, splenectomy, cancer chemotherapy, chronic steroid treatment, transplant patient, etc."     no 8. PREGNANCY: "Is there any chance you are pregnant?" "When was your last menstrual period?"     Not pregnant. Had hysterectomy 9. TRAVEL: "Have you traveled out of the country in the last month?" (e.g., travel history, exposures)     no  Protocols used: FEVER-A-AH

## 2018-09-17 NOTE — Telephone Encounter (Signed)
Appointment has been scheduled for 09/19/2018.

## 2018-09-19 ENCOUNTER — Telehealth: Payer: BC Managed Care – PPO | Admitting: Emergency Medicine

## 2018-10-09 ENCOUNTER — Other Ambulatory Visit: Payer: Self-pay

## 2018-10-09 ENCOUNTER — Encounter: Payer: Self-pay | Admitting: Emergency Medicine

## 2018-10-09 ENCOUNTER — Ambulatory Visit: Payer: BC Managed Care – PPO | Admitting: Emergency Medicine

## 2018-10-09 VITALS — BP 143/83 | HR 115 | Temp 99.2°F | Resp 16 | Ht 64.5 in | Wt 195.8 lb

## 2018-10-09 DIAGNOSIS — L723 Sebaceous cyst: Secondary | ICD-10-CM

## 2018-10-09 DIAGNOSIS — R29818 Other symptoms and signs involving the nervous system: Secondary | ICD-10-CM

## 2018-10-09 DIAGNOSIS — L989 Disorder of the skin and subcutaneous tissue, unspecified: Secondary | ICD-10-CM

## 2018-10-09 DIAGNOSIS — F17200 Nicotine dependence, unspecified, uncomplicated: Secondary | ICD-10-CM

## 2018-10-09 MED ORDER — NICOTINE 14 MG/24HR TD PT24: 14.0000 mg | MEDICATED_PATCH | Freq: Every day | TRANSDERMAL | 3 refills | Status: DC

## 2018-10-09 NOTE — Progress Notes (Signed)
Shelly [name] 43 y.o.   Chief Complaint  Patient presents with  . Cyst    on scalp per patient since 10/2017  . Nevus    on LEFT leg and LEFT ankle x 4-6 months    HISTORY OF PRESENT ILLNESS: This is a 43 y.o. female complaining of several problems: #1 cyst on her scalp for at least a year. #2 several skin lesions on trunk and extremities. #3 smoker, wants to quit, requesting NicoDerm patches. #4 suspected sleep apnea.  Patient has a history of snoring, waking up tired, and daytime sleepiness with episodes of gasping for air during her sleep.  Witnessed by ex-husband. #5 history of sarcoidosis. #6 history of patent foramina ovale, status post surgery last May 22.  Did well.  No complications.  HPI   Prior to Admission medications   Medication Sig Start Date End Date Taking? Authorizing Provider  albuterol (PROVENTIL HFA;VENTOLIN HFA) 108 (90 Base) MCG/ACT inhaler Inhale 1-2 puffs into the lungs every 4 (four) hours as needed. 07/16/17  Yes Barnett AbuWiseman, [place]ittany D, PA-C  aspirin (GOODSENSE ASPIRIN) 325 MG tablet Take 325 mg by mouth.   Yes [provider]  atorvastatin (LIPITOR) 20 MG tablet Take 20 mg by mouth daily.   Yes [provider]  cetirizine (ZYRTEC) 10 MG tablet Take 10 mg by mouth daily.   Yes [provider]  clopidogrel (PLAVIX) 75 MG tablet Take 75 mg by mouth daily.   Yes [provider]  estradiol (ESTRACE) 2 MG tablet Take 2 mg by mouth daily.   Yes [provider]  fluticasone (FLONASE) 50 MCG/ACT nasal spray Place 2 sprays into both nostrils daily. 01/01/18  Yes Antoneo Ghrist, Eilleen KempfMiguel Jose, MD  fluticasone furoate-vilanterol (BREO ELLIPTA) 200-25 MCG/INH AEPB Inhale 1 puff into the lungs daily. 01/01/18  Yes Dnaiel Voller, Eilleen KempfMiguel Jose, MD  montelukast (SINGULAIR) 10 MG tablet Take 1 tablet (10 mg total) by mouth at bedtime. 05/20/18  Yes Myles LippsSantiago, Irma M, MD  prochlorperazine (COMPAZINE) 10 MG tablet Take 1 tablet (10 mg total) by mouth  every 8 (eight) hours as needed for nausea or vomiting. 05/20/18  Yes Myles LippsSantiago, Irma M, MD  venlafaxine XR (EFFEXOR-XR) 150 MG 24 hr capsule Take 150 mg by mouth daily with breakfast.   Yes [provider]  venlafaxine XR (EFFEXOR-XR) 75 MG 24 hr capsule Take 75 mg by mouth. 08/22/16  Yes [provider]  azithromycin (ZITHROMAX) 250 MG tablet Sig as indicated 09/12/18   Georgina QuintSagardia, Delio Slates Jose, MD    Allergies  Allergen Reactions  . Gramineae Pollens Hives  . Clindamycin   . Clindamycin/Lincomycin   . Keflex [Cephalexin]   . Lincomycin Hcl   . Penicillins   . Sulfa Antibiotics Hives  . Sulfonamide Derivatives     Patient Active Problem List   Diagnosis Date Noted  . History of asthma 01/01/2018  . Chronic seasonal allergic rhinitis 01/01/2018  . Depression 09/07/2016  . 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 FO (foramen ovale) 09/09/2014  . Smoking addiction 06/05/2014  . Persistent asthma 06/05/2014  . GERD without esophagitis 06/05/2014  . SARCOIDOSIS 04/05/2007  . ARTHRITIS 04/05/2007    Past Medical History:  Diagnosis Date  . Allergy   . Anemia   . Anxiety   . Arthritis   . Depression   . GERD (gastroesophageal reflux disease)   . Migraines   . Miscarriage   . PFO (patent foramen ovale)   .  Sarcoidosis   . Stroke ([place])   . TIA (transient ischemic attack)     Past Surgical History:  Procedure Laterality Date  . BRONCHOSCOPY    . CESAREAN SECTION     miscarriage  . CHOLECYSTECTOMY    . HAND SURGERY    . miscarriage  08/10/14  . NASAL SINUS SURGERY      Social History   Socioeconomic History  . Marital status: Married    Spouse name: Not on file  . Number of children: 1  . Years of education: Not on file  . Highest education level: Not on file  Occupational History  . Not on file  Social Needs  . Financial resource strain: Not on file  . Food insecurity     Worry: Not on file    Inability: Not on file  . Transportation needs    Medical: Not on file    Non-medical: Not on file  Tobacco Use  . Smoking status: Current Every Day Smoker    Packs/day: 1.00    Years: 26.00    Pack years: 26.00    Types: Cigarettes  . Smokeless tobacco: Never Used  Substance and Sexual Activity  . Alcohol use: No    Alcohol/week: 0.0 standard drinks  . Drug use: No  . Sexual activity: Yes  Lifestyle  . Physical activity    Days per week: Not on file    Minutes per session: Not on file  . Stress: Not on file  Relationships  . Social Herbalist on phone: Not on file    Gets together: Not on file    Attends religious service: Not on file    Active member of club or organization: Not on file    Attends meetings of clubs or organizations: Not on file    Relationship status: Not on file  . Intimate partner violence    Fear of current or ex partner: Not on file    Emotionally abused: Not on file    Physically abused: Not on file    Forced sexual activity: Not on file  Other Topics Concern  . Not on file  Social History Narrative  . Not on file    Family History  Problem Relation Age of Onset  . Osteoporosis Mother   . Asthma Mother   . Diabetes Father   . Heart disease Father   . Kidney disease Maternal Grandmother   . Heart disease Maternal Grandfather   . Cancer Paternal Grandmother   . Heart disease Paternal Grandfather      Review of Systems  Constitutional: Negative.  Negative for chills and fever.  HENT: Negative.  Negative for congestion, nosebleeds and sore throat.   Eyes: Negative.   Respiratory: Negative.  Negative for cough and shortness of breath.   Cardiovascular: Negative.  Negative for chest pain and palpitations.  Gastrointestinal: Negative.  Negative for abdominal pain, diarrhea, nausea and vomiting.  Genitourinary: Negative.  Negative for dysuria.  Musculoskeletal: Negative.   Skin:       Multiple skin  lesions on trunk and extremities. Scalp cyst.  Neurological: Negative.  Negative for dizziness, focal weakness and headaches.       History of TIAs  Endo/Heme/Allergies: Negative.   All other systems reviewed and are negative.  Vitals:   10/09/18 0948  BP: (!) 143/83  Pulse: (!) 115  Resp: 16  Temp: 99.2 F (37.3 C)  SpO2: 95%     Physical Exam Vitals signs  reviewed.  Constitutional:      Appearance: Normal appearance.  HENT:     Head: Normocephalic and atraumatic.     Mouth/Throat:     Mouth: Mucous membranes are moist.     Pharynx: Oropharynx is clear.  Eyes:     Extraocular Movements: Extraocular movements intact.     Conjunctiva/sclera: Conjunctivae normal.     Pupils: Pupils are equal, round, and reactive to light.  Neck:     Musculoskeletal: Normal range of motion and neck supple.  Cardiovascular:     Rate and Rhythm: Normal rate and regular rhythm.     Heart sounds: Normal heart sounds.  Pulmonary:     Effort: Pulmonary effort is normal.     Breath sounds: Normal breath sounds.  Musculoskeletal: Normal range of motion.  Lymphadenopathy:     Cervical: No cervical adenopathy.  Skin:    General: Skin is warm and dry.     Capillary Refill: Capillary refill takes less than 2 seconds.  Neurological:     General: No focal deficit present.     Mental Status: She is alert and oriented to person, place, and time.     Sensory: No sensory deficit.     Motor: No weakness.  Psychiatric:        Mood and Affect: Mood normal.        Behavior: Behavior normal.      ASSESSMENT & PLAN: Marchelle Folksmanda was seen today for cyst and nevus.  Diagnoses and all orders for this visit:  Skin lesion -     Ambulatory referral to Dermatology  Suspected sleep apnea -     Ambulatory referral to Neurology  Smoker -     nicotine (NICODERM CQ) 14 mg/24hr patch; Place 1 patch (14 mg total) onto the skin daily.  Sebaceous cyst Comments: scalp    Patient Instructions       If  you have lab work done today you will be contacted with your lab results within the next 2 weeks.  If you have not heard from us then please contact us. The fastest way to get your results is to register for My Chart.   IF you received an x-ray today, you will receive an invoice from [facility]. Please contact [facility] at [phone] with questions or concerns regarding your invoice.   IF you received labwork today, you will receive an invoice from [facility]. Please contact [facility] at [phone] with questions or concerns regarding your invoice.   Our billing staff will not be able to assist you with questions regarding bills from these companies.  You will be contacted with the lab results as soon as they are available. The fastest way to get your results is to activate your My Chart account. Instructions are located on the last page of this paperwork. If you have not heard from us regarding the results in 2 weeks, please contact this office.    We recommend that you schedule a mammogram for breast cancer screening. Typically, you do not need a referral to do this. Please contact a local imaging center to schedule your mammogram.  [facility] - [phone]  *ask for the Radiology Department [facility] - [phone] or [phone]) [phone]  [facility] - [phone] [facility] - [phone] [facility] - [phone]  *ask for the Radiology Department [facility] - [phone]) [phone]  *ask for the Radiology Department [facility] - [phone]  *  ask for the Mammography Department [facility] - [phone] Epidermal Cyst  An epidermal cyst is a small, painless lump under your skin. The cyst contains a grayish-white, bad-smelling substance (keratin). Do not try to pop or open an epidermal cyst yourself. What are the causes?  A blocked hair follicle.  A hair  that curls and re-enters the skin instead of growing straight out of the skin.  A blocked pore.  Irritated skin.  An injury to the skin.  Certain conditions that are passed along from parent to child (inherited).  Human papillomavirus (HPV).  Long-term sun damage to the skin. What increases the risk?  Having acne.  Being overweight.  Being 7630-45 years old. What are the signs or symptoms? These cysts are usually harmless, but they can get infected. Symptoms of infection may include:  Redness.  Inflammation.  Tenderness.  Warmth.  Fever.  A grayish-white, bad-smelling substance drains from the cyst.  Pus drains from the cyst. How is this treated? In many cases, epidermal cysts go away on their own without treatment. If a cyst becomes infected, treatment may include:  Opening and draining the cyst, done by a doctor. After draining, you may need minor surgery to remove the rest of the cyst.  Antibiotic medicine.  Shots of medicines (steroids) that help to reduce inflammation.  Surgery to remove the cyst. Surgery may be done if the cyst: ? Becomes large. ? Bothers you. ? Has a chance of turning into cancer.  Do not try to open a cyst yourself. Follow these instructions at home:  Take over-the-counter and prescription medicines only as told by your doctor.  If you were prescribed an antibiotic medicine, take it it as told by your doctor. Do not stop using the antibiotic even if you start to feel better.  Keep the area around your cyst clean and dry.  Wear loose, dry clothing.  Avoid touching your cyst.  Check your cyst every day for signs of infection. Check for: ? Redness, swelling, or pain. ? Fluid or blood. ? Warmth. ? Pus or a bad smell.  Keep all follow-up visits as told by your doctor. This is important. How is this prevented?  Wear clean, dry, clothing.  Avoid wearing tight clothing.  Keep your skin clean and dry. Take showers or baths  every day. Contact a doctor if:  Your cyst has symptoms of infection.  Your condition does not improve or gets worse.  You have a cyst that looks different from other cysts you have had.  You have a fever. Get help right away if:  Redness spreads from the cyst into the area close by. Summary  An epidermal cyst is a sac made of skin tissue.  If a cyst becomes infected, treatment may include surgery to open and drain the cyst, or to remove it.  Take over-the-counter and prescription medicines only as told by your doctor.  Contact a doctor if your condition is not improving or is getting worse.  Keep all follow-up visits as told by your doctor. This is important. This information is not intended to replace advice given to you by your health care provider. Make sure you discuss any questions you have with your health care provider. Document Released: 05/11/2004 Document Revised: 10/15/2017 Document Reviewed: 02/03/2015 Elsevier Interactive Patient Education  2019 Elsevier Inc.      Edwina BarthMiguel Avrey Hyser, MD [facility]

## 2018-10-09 NOTE — Patient Instructions (Addendum)
If you have lab work done today you will be contacted with your lab results within the next 2 weeks.  If you have not heard from [place] then please contact us. The fastest way to get your results is to register for My Chart.   IF you received an x-ray today, you will receive an invoice from [facility]. Please contact [facility] at [phone] with questions or concerns regarding your invoice.   IF you received labwork today, you will receive an invoice from [facility]. Please contact [facility] at [phone] with questions or concerns regarding your invoice.   Our billing staff will not be able to assist you with questions regarding bills from these companies.  You will be contacted with the lab results as soon as they are available. The fastest way to get your results is to activate your My Chart account. Instructions are located on the last page of this paperwork. If you have not heard from [place] regarding the results in 2 weeks, please contact this office.    We recommend that you schedule a mammogram for breast cancer screening. Typically, you do not need a referral to do this. Please contact a local imaging center to schedule your mammogram.  [facility] - [phone]  *ask for the Radiology Department [facility] - [phone] or [phone]  [facility] - [phone] [place] [phone] [facility] - [phone]  *ask for the [facility] - [phone]  *ask for the Radiology Department [facility] - [phone]  *ask for the [place] - [phone] Epidermal Cyst  An epidermal cyst is a small, painless lump under your skin. The cyst contains a grayish-white, bad-smelling substance (keratin). Do not try to pop or open an epidermal cyst yourself. What are the causes?  A blocked hair  follicle.  A hair that curls and re-enters the skin instead of growing straight out of the skin.  A blocked pore.  Irritated skin.  An injury to the skin.  Certain conditions that are passed along from parent to child (inherited).  Human papillomavirus (HPV).  Long-term sun damage to the skin. What increases the risk?  Having acne.  Being overweight.  Being 56-101 years old. What are the signs or symptoms? These cysts are usually harmless, but they can get infected. Symptoms of infection may include:  Redness.  Inflammation.  Tenderness.  Warmth.  Fever.  A grayish-white, bad-smelling substance drains from the cyst.  Pus drains from the cyst. How is this treated? In many cases, epidermal cysts go away on their own without treatment. If a cyst becomes infected, treatment may include:  Opening and draining the cyst, done by a doctor. After draining, you may need minor surgery to remove the rest of the cyst.  Antibiotic medicine.  Shots of medicines (steroids) that help to reduce inflammation.  Surgery to remove the cyst. Surgery may be done if the cyst: ? Becomes large. ? Bothers you. ? Has a chance of turning into cancer.  Do not try to open a cyst yourself. Follow these instructions at home:  Take over-the-counter and prescription medicines only as told by your doctor.  If you were prescribed an antibiotic medicine, take it it as told by your doctor. Do not stop using the antibiotic even if you start to feel better.  Keep the area around your cyst clean and dry.  Wear loose, dry clothing.  Avoid touching your cyst.  Check your cyst every day for signs of infection. Check for: ? Redness, swelling, or pain. ? Fluid or blood. ? Warmth. ? Pus or a bad smell.  Keep all follow-up visits as told by your doctor. This is important. How is this prevented?  Wear clean, dry, clothing.  Avoid wearing tight clothing.  Keep your skin clean and dry. Take  showers or baths every day. Contact a doctor if:  Your cyst has symptoms of infection.  Your condition does not improve or gets worse.  You have a cyst that looks different from other cysts you have had.  You have a fever. Get help right away if:  Redness spreads from the cyst into the area close by. Summary  An epidermal cyst is a sac made of skin tissue.  If a cyst becomes infected, treatment may include surgery to open and drain the cyst, or to remove it.  Take over-the-counter and prescription medicines only as told by your doctor.  Contact a doctor if your condition is not improving or is getting worse.  Keep all follow-up visits as told by your doctor. This is important. This information is not intended to replace advice given to you by your health care provider. Make sure you discuss any questions you have with your health care provider. Document Released: 05/11/2004 Document Revised: 10/15/2017 Document Reviewed: 02/03/2015 Elsevier Interactive Patient Education  2019 [employer]

## 2018-10-15 ENCOUNTER — Encounter: Payer: Self-pay | Admitting: Neurology

## 2018-10-15 ENCOUNTER — Other Ambulatory Visit: Payer: Self-pay

## 2018-10-15 ENCOUNTER — Ambulatory Visit (INDEPENDENT_AMBULATORY_CARE_PROVIDER_SITE_OTHER): Payer: BC Managed Care – PPO | Admitting: Neurology

## 2018-10-15 VITALS — BP 132/94 | HR 106 | Temp 97.3°F | Ht 64.0 in | Wt 196.0 lb

## 2018-10-15 DIAGNOSIS — G4719 Other hypersomnia: Secondary | ICD-10-CM | POA: Diagnosis not present

## 2018-10-15 DIAGNOSIS — Z8673 Personal history of transient ischemic attack (TIA), and cerebral infarction without residual deficits: Secondary | ICD-10-CM

## 2018-10-15 DIAGNOSIS — Z820 Family history of epilepsy and other diseases of the nervous system: Secondary | ICD-10-CM

## 2018-10-15 DIAGNOSIS — Q2112 Patent foramen ovale: Secondary | ICD-10-CM

## 2018-10-15 DIAGNOSIS — E669 Obesity, unspecified: Secondary | ICD-10-CM | POA: Diagnosis not present

## 2018-10-15 DIAGNOSIS — R0683 Snoring: Secondary | ICD-10-CM | POA: Diagnosis not present

## 2018-10-15 DIAGNOSIS — Q211 Atrial septal defect: Secondary | ICD-10-CM

## 2018-10-15 DIAGNOSIS — R0681 Apnea, not elsewhere classified: Secondary | ICD-10-CM

## 2018-10-15 NOTE — Patient Instructions (Signed)

## 2018-10-15 NOTE — Progress Notes (Signed)
Subjective:    Patient ID: Shelly [name] is a 43 y.o. female.  HPI     Star Age, MD, PhD [facility] [address] [place], [place] [address]  Dear Dr. Mitchel Honour, I saw your patient, Shelly [name], upon your kind request in my sleep clinic today for initial consultation of her sleep disorder, in particular, concern for underlying obstructive sleep apnea.  The patient is unaccompanied today.  As you know, Shelly [name] is a 43 year old right-handed woman with an underlying medical history of TIA (by chart review), PFO with status post recent closure, migraine headaches, reflux disease, depression, anxiety, arthritis, sarcoidosis, anemia, allergies and obesity, who reports snoring and excessive daytime somnolence as well as witnessed apneas per husband's report.  I reviewed your office note from 10/09/2018.  Her Epworth sleepiness score is 13/24, fatigue severity score is 42/63.  She presented to the hospital in [facility] in February with TIA-like symptoms, she had right facial numbness, symptoms were preceded by a headache.  She has a history of migraines but typically has visual auras and not always a headache.  Her symptoms were different from before and she was diagnosed with a TIA again.  She had PFO closure in May 2020.  She has noticed some tachycardia and palpitations since then but no irregular heartbeat and no shortness of breath.  She has had weight gain over time.  She was in the past on steroids for about 3 years.  She was on prednisone till 2009.  She is currently on Plavix and half of an adult size aspirin.  She has occasional morning headaches, she reports a family history of sleep apnea affecting her mother and her brother.  She is separated from her husband and lives with her 45-year-old son.  She is a Patent examiner.  She has nocturia about once per average night.  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  When she had a more regular schedule before March, she would typically go to bed between 9 and 10 and rise time was around 6.  She has reduced her caffeine intake and she is trying to quit smoking.  She took Chantix through January 2020 but stopped it in the first week of February.  She is supposed to start nicotine patches.  She drinks no alcohol on a regular basis, caffeine currently in the form of soda, one small serving per day on average.  She is followed by neurology in [facility].  She was diagnosed with a PFO in 2011 and was supposed to have sleep study testing done but it never came to fruition.  Her Past Medical History Is Significant For: Past Medical History:  Diagnosis Date  . Allergy   . Anemia   . Anxiety   . Arthritis   . Depression   . GERD (gastroesophageal reflux disease)   . Migraines   . Miscarriage   . PFO (patent foramen ovale)   . Sarcoidosis   . Stroke ([place])   . TIA (transient ischemic attack)     Her Past Surgical History Is Significant For: Past Surgical History:  Procedure Laterality Date  . BRONCHOSCOPY    . CESAREAN SECTION     miscarriage  . CHOLECYSTECTOMY    . HAND SURGERY    . miscarriage  08/10/14  . NASAL SINUS SURGERY      Her Family  History Is Significant For: Family History  Problem Relation Age of Onset  . Osteoporosis Mother   . Asthma Mother   . Diabetes Father   . Heart disease Father   . Kidney disease Maternal Grandmother   . Heart disease Maternal Grandfather   . Cancer Paternal Grandmother   . Heart disease Paternal Grandfather     Her Social History Is Significant For: Social History   Socioeconomic History  . Marital status: Married    Spouse name: Not on file  . Number of children: 1  . Years of education: Not on file  . Highest education level: Not on file  Occupational History  . Not on file  Social Needs  . Financial resource strain: Not on file  . Food insecurity     Worry: Not on file    Inability: Not on file  . Transportation needs    Medical: Not on file    Non-medical: Not on file  Tobacco Use  . Smoking status: Current Every Day Smoker    Packs/day: 1.00    Years: 26.00    Pack years: 26.00    Types: Cigarettes  . Smokeless tobacco: Never Used  Substance and Sexual Activity  . Alcohol use: No    Alcohol/week: 0.0 standard drinks  . Drug use: No  . Sexual activity: Yes  Lifestyle  . Physical activity    Days per week: Not on file    Minutes per session: Not on file  . Stress: Not on file  Relationships  . Social Musicianconnections    Talks on phone: Not on file    Gets together: Not on file    Attends religious service: Not on file    Active member of club or organization: Not on file    Attends meetings of clubs or organizations: Not on file    Relationship status: Not on file  Other Topics Concern  . Not on file  Social History Narrative  . Not on file    Her Allergies Are:  Allergies  Allergen Reactions  . Gramineae Pollens Hives  . Clindamycin   . Clindamycin/Lincomycin   . Keflex [Cephalexin]   . Lincomycin Hcl   . Penicillins   . Sulfa Antibiotics Hives  . Sulfonamide Derivatives   :   Her Current Medications Are:  Outpatient Encounter Medications as of 10/15/2018  Medication Sig  . albuterol (PROVENTIL HFA;VENTOLIN HFA) 108 (90 Base) MCG/ACT inhaler Inhale 1-2 puffs into the lungs every 4 (four) hours as needed.  Marland Kitchen. aspirin (GOODSENSE ASPIRIN) 325 MG tablet Take 325 mg by mouth.  Marland Kitchen. atorvastatin (LIPITOR) 20 MG tablet Take 20 mg by mouth daily.  . cetirizine (ZYRTEC) 10 MG tablet Take 10 mg by mouth daily.  . clopidogrel (PLAVIX) 75 MG tablet Take 75 mg by mouth daily.  Marland Kitchen. estradiol (ESTRACE) 2 MG tablet Take 2 mg by mouth daily.  . fluticasone (FLONASE) 50 MCG/ACT nasal spray Place 2 sprays into both nostrils daily.  . fluticasone furoate-vilanterol (BREO ELLIPTA) 200-25 MCG/INH AEPB Inhale 1 puff into the lungs daily.   . montelukast (SINGULAIR) 10 MG tablet Take 1 tablet (10 mg total) by mouth at bedtime.  . nicotine (NICODERM CQ) 14 mg/24hr patch Place 1 patch (14 mg total) onto the skin daily.  . prochlorperazine (COMPAZINE) 10 MG tablet Take 1 tablet (10 mg total) by mouth every 8 (eight) hours as needed for nausea or vomiting.  . venlafaxine XR (EFFEXOR-XR) 150 MG 24 hr capsule Take  150 mg by mouth daily with breakfast.  . venlafaxine XR (EFFEXOR-XR) 75 MG 24 hr capsule Take 75 mg by mouth.  Marland Kitchen. azithromycin (ZITHROMAX) 250 MG tablet Sig as indicated   No facility-administered encounter medications on file as of 10/15/2018.   :  Review of Systems:  Out of a complete 14 point review of systems, all are reviewed and negative with the exception of these symptoms as listed below: Review of Systems  Neurological:       Pt presents today to discuss her sleep. Pt has never had a sleep study but does endorse snoring.  Epworth Sleepiness Scale 0= would never doze 1= slight chance of dozing 2= moderate chance of dozing 3= high chance of dozing  Sitting and reading: 2 Watching TV: 3 Sitting inactive in a public place (ex. Theater or meeting): 1 As a passenger in a car for an hour without a break: 2 Lying down to rest in the afternoon: 3 Sitting and talking to someone: 0 Sitting quietly after lunch (no alcohol): 2 In a car, while stopped in traffic: 0 Total: 13     Objective:  Neurological Exam  Physical Exam Physical Examination:   Vitals:   10/15/18 1529  BP: (!) 132/94  Pulse: (!) 106  Temp: (!) 97.3 F (36.3 C)   General Examination: The patient is a very pleasant 43 y.o. female in no acute distress. She appears well-developed and well-nourished and well groomed.   HEENT: Normocephalic, atraumatic, pupils are equal, round and reactive to light and accommodation. Extraocular tracking is good without limitation to gaze excursion or nystagmus noted. Normal smooth pursuit is noted. Hearing  is grossly intact. Face is symmetric with normal facial animation and normal facial sensation. Speech is clear with no dysarthria noted. There is no hypophonia. There is no lip, neck/head, jaw or voice tremor. Neck is supple with full range of passive and active motion. There are no carotid bruits on auscultation. Oropharynx exam reveals: mild mouth dryness, good dental hygiene and moderate airway crowding, due to Tonsillar size of 2+, and small airway entry noted. Mallampati is class II. Tongue protrudes centrally and palate elevates symmetrically. Neck size is 17 1/8 inches. She has a Mild overbite.   Chest: Clear to auscultation without wheezing, rhonchi or crackles noted.  Heart: S1+S2+0, regular and normal without murmurs, rubs or gallops noted.   Abdomen: Soft, non-tender and non-distended with normal bowel sounds appreciated on auscultation.  Extremities: There is no pitting edema in the distal lower extremities bilaterally.  Skin: Warm and dry without trophic changes noted.  Musculoskeletal: exam reveals no obvious joint deformities, tenderness or joint swelling or erythema.   Neurologically:  Mental status: The patient is awake, alert and oriented in all 4 spheres. Her immediate and remote memory, attention, language skills and fund of knowledge are appropriate. There is no evidence of aphasia, agnosia, apraxia or anomia. Speech is clear with normal prosody and enunciation. Thought process is linear. Mood is normal and affect is normal.  Cranial nerves II - XII are as described above under HEENT exam. In addition: shoulder shrug is normal with equal shoulder height noted. Motor exam: Normal bulk, strength and tone is noted. There is no drift, tremor or rebound. Romberg is negative. Reflexes are 2+ throughout. Babinski: Toes are flexor bilaterally. Fine motor skills and coordination: intact with normal finger taps, normal hand movements, normal rapid alternating patting, normal foot taps and  normal foot agility.  Cerebellar testing: No dysmetria or intention tremor.  There is no truncal or gait ataxia.  Sensory exam: intact to light touch in the upper and lower extremities.  Gait, station and balance: She stands easily. No veering to one side is noted. No leaning to one side is noted. Posture is age-appropriate and stance is narrow based. Gait shows normal stride length and normal pace. No problems turning are noted. Tandem walk is unremarkable.  Assessment and Plan:   In summary, Providence Crosbymanda S Padron is a very pleasant 43 y.o.-year old female with a history and physical exam concerning for obstructive sleep apnea (OSA). I had a long chat with the patient about my findings and the diagnosis of OSA, its prognosis and treatment options. We talked about medical treatments, surgical interventions and non-pharmacological approaches. I explained in particular the risks and ramifications of untreated moderate to severe OSA, especially with respect to developing cardiovascular disease down the Road, including congestive heart failure, difficult to treat hypertension, cardiac arrhythmias, or stroke. Even type 2 diabetes has, in part, been linked to untreated OSA. Symptoms of untreated OSA include daytime sleepiness, memory problems, mood irritability and mood disorder such as depression and anxiety, lack of energy, as well as recurrent headaches, especially morning headaches. We talked about smoking cessation and trying to maintain a healthy lifestyle in general, as well as the importance of weight control. I encouraged the patient to eat healthy, exercise daily and keep well hydrated, to keep a scheduled bedtime and wake time routine, to not skip any meals and eat healthy snacks in between meals. I advised the patient not to drive when feeling sleepy. I recommended the following at this time: sleep study.   I explained the sleep test procedure to the patient and also outlined possible surgical and  non-surgical treatment options of OSA, including the use of a custom-made dental device (which would require a referral to a specialist dentist or oral surgeon), upper airway surgical options, such as pillar implants, radiofrequency surgery, tongue base surgery, and UPPP (which would involve a referral to an ENT surgeon). Rarely, jaw surgery such as mandibular advancement may be considered.  I also explained the CPAP treatment option to the patient, who indicated that she would be willing to try CPAP if the need arises. I explained the importance of being compliant with PAP treatment, not only for insurance purposes but primarily to improve Her symptoms, and for the patient's long term health benefit, including to reduce Her cardiovascular risks. I answered all her questions today and the patient was in agreement. I plan to see her back after the sleep study is completed and encouraged her to call with any interim questions, concerns, problems or updates.   Thank you very much for allowing me to participate in the care of this nice patient. If I can be of any further assistance to you please do not hesitate to call me at [phone].  Sincerely,   Huston FoleySaima Caileigh Canche, MD, PhD

## 2018-11-10 ENCOUNTER — Ambulatory Visit (INDEPENDENT_AMBULATORY_CARE_PROVIDER_SITE_OTHER): Payer: BC Managed Care – PPO | Admitting: Neurology

## 2018-11-10 DIAGNOSIS — G4733 Obstructive sleep apnea (adult) (pediatric): Secondary | ICD-10-CM

## 2018-11-10 DIAGNOSIS — Z820 Family history of epilepsy and other diseases of the nervous system: Secondary | ICD-10-CM

## 2018-11-10 DIAGNOSIS — R0681 Apnea, not elsewhere classified: Secondary | ICD-10-CM

## 2018-11-10 DIAGNOSIS — G4719 Other hypersomnia: Secondary | ICD-10-CM

## 2018-11-10 DIAGNOSIS — Q2112 Patent foramen ovale: Secondary | ICD-10-CM

## 2018-11-10 DIAGNOSIS — Q211 Atrial septal defect: Secondary | ICD-10-CM

## 2018-11-10 DIAGNOSIS — G4720 Circadian rhythm sleep disorder, unspecified type: Secondary | ICD-10-CM

## 2018-11-10 DIAGNOSIS — Z8673 Personal history of transient ischemic attack (TIA), and cerebral infarction without residual deficits: Secondary | ICD-10-CM

## 2018-11-10 DIAGNOSIS — E669 Obesity, unspecified: Secondary | ICD-10-CM

## 2018-11-10 DIAGNOSIS — R0683 Snoring: Secondary | ICD-10-CM

## 2018-11-12 ENCOUNTER — Telehealth: Payer: Self-pay

## 2018-11-12 NOTE — Addendum Note (Signed)
Addended by: Star Age on: 11/12/2018 09:02 AM   Modules accepted: Orders

## 2018-11-12 NOTE — Telephone Encounter (Signed)
Pt returning call please call back °

## 2018-11-12 NOTE — Telephone Encounter (Signed)
I called pt. I advised pt that Dr. Athar reviewed their sleep study results and found that has severe osa and recommends that pt be treated with a cpap. Dr. Athar recommends that pt return for a repeat sleep study in order to properly titrate the cpap and ensure a good mask fit. Pt is agreeable to returning for a titration study. I advised pt that our sleep lab will file with pt's insurance and call pt to schedule the sleep study when we hear back from the pt's insurance regarding coverage of this sleep study. Pt verbalized understanding of results. Pt had no questions at this time but was encouraged to call back if questions arise.   

## 2018-11-12 NOTE — Procedures (Signed)
ventPATIENT'S NAME:  Shelly [name], Shelly [name] DOB:      1975-05-12      MR#:    [id number]     DATE OF RECORDING: 11/10/2018 REFERRING M.D.:  Edwina BarthMiguel Sagardia, MD Study Performed:   Baseline Polysomnogram HISTORY: 43 year old woman with a history of TIA, PFO (s/p closure), migraine headaches, reflux disease, depression, anxiety, arthritis, sarcoidosis, anemia, allergies and obesity, who reports snoring and excessive daytime somnolence as well as witnessed apneas per husband's report. The patient endorsed the Epworth Sleepiness Scale at 13 points. The patient's weight 196 pounds with a height of 64 (inches), resulting in a BMI of 33.5 kg/m2. The patient's neck circumference measured 17.15 inches.  CURRENT MEDICATIONS: Proventil, Aspirin, Lipitor, Zyrtec, Plavix, Estrace, Flonase, Singulair, Nicoderm, Compazine, Effexor, Zithromax   PROCEDURE:  This is a multichannel digital polysomnogram utilizing the Somnostar 11.2 system.  Electrodes and sensors were applied and monitored per AASM Specifications.   EEG, EOG, Chin and Limb EMG, were sampled at 200 Hz.  ECG, Snore and Nasal Pressure, Thermal Airflow, Respiratory Effort, CPAP Flow and Pressure, Oximetry was sampled at 50 Hz. Digital video and audio were recorded.      BASELINE STUDY  Lights Out was at 22:35 and Lights On at 04:59.  Total recording time (TRT) was 384.5 minutes, with a total sleep time (TST) of 320.5 minutes.   The patient's sleep latency was 27.5 minutes, which is mildly delayed.  REM latency was 96 minutes, which is normal. The sleep efficiency was 83.4 %.     SLEEP ARCHITECTURE: WASO (Wake after sleep onset) was 36.5 minutes with mild to moderate sleep fragmentation noted. There were 56.5 minutes in Stage N1, 111.5 minutes Stage N2, 127 minutes Stage N3 and 25.5 minutes in Stage REM.  The percentage of Stage N1 was 17.6%, which is increased, Stage N2 was 34.8%, Stage N3 was 39.6%, which is increased, and Stage R (REM sleep) was 8.%, which is  reduced.   RESPIRATORY ANALYSIS:  There were a total of 207 respiratory events:  12 obstructive apneas, 9 central apneas and 37 mixed apneas with a total of 58 apneas and an apnea index (AI) of 10.9 /hour. There were 149 hypopneas with a hypopnea index of 27.9 /hour. The patient also had 0 respiratory event related arousals (RERAs).      The total APNEA/HYPOPNEA INDEX (AHI) was 38.8 /hour and the total RESPIRATORY DISTURBANCE INDEX was 0. 38.8 /hour.  28 events occurred in REM sleep and 293 events in NREM. The REM AHI was 65.9 /hour, versus a non-REM AHI of 36.4. The patient spent 0 minutes of total sleep time in the supine position and 321 minutes in non-supine.. The supine AHI was n/a versus a non-supine AHI of 38.8.  OXYGEN SATURATION & C02:  The Wake baseline 02 saturation was 96%, with the lowest being 69%. Time spent below 89% saturation equaled 53 minutes.  PERIODIC LIMB MOVEMENTS: The patient had a total of 12 Periodic Limb Movements.  The Periodic Limb Movement (PLM) index was 2.2 and the PLM Arousal index was 0/hour. The arousals were noted as: 28 were spontaneous, 0 were associated with PLMs, 107 were associated with respiratory events.  Audio and video analysis did not show any abnormal or unusual movements, behaviors, phonations or vocalizations. The patient took 1 bathroom break. Moderate to loud snoring was noted. The EKG was in keeping with normal sinus rhythm (NSR).  Post-study, the patient indicated that sleep was better than usual.   IMPRESSION:  1. Obstructive Sleep  Apnea(OSA) 2. Dysfunctions associated with sleep stages or arousal from sleep  RECOMMENDATIONS:  1. This study demonstrates severe obstructive sleep apnea, with a total AHI of 38.8/hour, REM AHI of 65.9/hour, and O2 nadir of 69%. The absence of supine sleep likely underestimates her sleep disordered breathing. Treatment with positive airway pressure in the form of CPAP is recommended. This will require a full  night titration study to optimize therapy. Other treatment options may include avoidance of supine sleep position along with weight loss, upper airway or jaw surgery in selected patients or the use of an oral appliance in certain patients. ENT evaluation and/or consultation with a maxillofacial surgeon or dentist may be feasible in some instances.    2. Please note that untreated obstructive sleep apnea may carry additional perioperative morbidity. Patients with significant obstructive sleep apnea should receive perioperative PAP therapy and the surgeons and particularly the anesthesiologist should be informed of the diagnosis and the severity of the sleep disordered breathing. 3. This study shows sleep fragmentation and abnormal sleep stage percentages; these are nonspecific findings and per se do not signify an intrinsic sleep disorder or a cause for the patient's sleep-related symptoms. Causes include (but are not limited to) the first night effect of the sleep study, circadian rhythm disturbances, medication effect or an underlying mood disorder or medical problem.  4. The patient should be cautioned not to drive, work at heights, or operate dangerous or heavy equipment when tired or sleepy. Review and reiteration of good sleep hygiene measures should be pursued with any patient. 5. The patient will be seen in follow-up in the sleep clinic at [facility] for discussion of the test results, symptom and treatment compliance review, further management strategies, etc. The referring provider will be notified of the test results.  I certify that I have reviewed the entire raw data recording prior to the issuance of this report in accordance with the Standards of Accreditation of the American Academy of Sleep Medicine (AASM)  Star Age, MD, PhD Diplomat, American Board of Neurology and Sleep Medicine (Neurology and Sleep Medicine)

## 2018-11-12 NOTE — Telephone Encounter (Signed)
-----   Message from Star Age, MD sent at 11/12/2018  9:02 AM EDT ----- Patient referred by Dr. Mitchel Honour, seen by me on 10/15/18, diagnostic PSG on 11/10/18.   Please call and notify the patient that the recent sleep study showed severe obstructive sleep apnea. I recommend treatment for this in the form of CPAP. This will require a repeat sleep study for proper titration and mask fitting and correct monitoring of the oxygen saturations. Please explain to patient. I have placed an order in the chart. Thanks.  Star Age, MD, PhD [facility])

## 2018-11-12 NOTE — Progress Notes (Signed)
Patient referred by Dr. Mitchel Honour, seen by me on 10/15/18, diagnostic PSG on 11/10/18.   Please call and notify the patient that the recent sleep study showed severe obstructive sleep apnea. I recommend treatment for this in the form of CPAP. This will require a repeat sleep study for proper titration and mask fitting and correct monitoring of the oxygen saturations. Please explain to patient. I have placed an order in the chart. Thanks.  Star Age, MD, PhD [facility])

## 2018-11-12 NOTE — Telephone Encounter (Signed)
I called pt to discuss her sleep study results. No answer, left a message asking her to call me back. 

## 2018-12-10 ENCOUNTER — Other Ambulatory Visit: Payer: Self-pay | Admitting: Family Medicine

## 2018-12-10 NOTE — Telephone Encounter (Signed)
Requested medication (s) are due for refill today: yes  Requested medication (s) are on the active medication list: yes Last refill:  05/20/2018  Future visit scheduled: no  Notes to clinic:  pcp is different than ordering provider   Requested Prescriptions  Pending Prescriptions Disp Refills   montelukast (SINGULAIR) 10 MG tablet [Pharmacy Med Name: MONTELUKAST SOD 10 MG TABLET] 90 tablet 1    Sig: TAKE 1 TABLET BY MOUTH EVERYDAY AT BEDTIME     Pulmonology:  Leukotriene Inhibitors Passed - 12/10/2018 11:58 AM      Passed - Valid encounter within last 12 months    Recent Outpatient Visits          2 months ago Skin lesion   Primary Care at [facility], Ines Bloomer, MD   6 months ago Acute non-recurrent frontal sinusitis   [facility] at [facility], Lilia Argue, MD   11 months ago Cough   Primary Care at [facility], [place], MD   1 year ago Persistent asthma with acute exacerbation, unspecified asthma severity   Primary Care at [place], [place] D, PA-C   1 year ago Persistent asthma with acute exacerbation, unspecified asthma severity   Primary Care at [facility], [place] D, [place]

## 2018-12-13 ENCOUNTER — Other Ambulatory Visit (HOSPITAL_COMMUNITY)
Admission: RE | Admit: 2018-12-13 | Discharge: 2018-12-13 | Disposition: A | Discharge status: Home / Self Care | Payer: BC Managed Care – PPO | Source: Ambulatory Visit | Attending: Internal Medicine | Admitting: Internal Medicine

## 2018-12-13 DIAGNOSIS — Z01812 Encounter for preprocedural laboratory examination: Secondary | ICD-10-CM | POA: Insufficient documentation

## 2018-12-13 DIAGNOSIS — Z20828 Contact with and (suspected) exposure to other viral communicable diseases: Secondary | ICD-10-CM | POA: Insufficient documentation

## 2018-12-13 LAB — SARS CORONAVIRUS 2 (TAT 6-24 HRS): SARS Coronavirus 2: NEGATIVE

## 2018-12-16 ENCOUNTER — Ambulatory Visit (INDEPENDENT_AMBULATORY_CARE_PROVIDER_SITE_OTHER): Payer: BC Managed Care – PPO | Admitting: Neurology

## 2018-12-16 DIAGNOSIS — Z8673 Personal history of transient ischemic attack (TIA), and cerebral infarction without residual deficits: Secondary | ICD-10-CM

## 2018-12-16 DIAGNOSIS — G4719 Other hypersomnia: Secondary | ICD-10-CM

## 2018-12-16 DIAGNOSIS — G4733 Obstructive sleep apnea (adult) (pediatric): Secondary | ICD-10-CM

## 2018-12-16 DIAGNOSIS — G4720 Circadian rhythm sleep disorder, unspecified type: Secondary | ICD-10-CM

## 2018-12-16 DIAGNOSIS — Q211 Atrial septal defect: Secondary | ICD-10-CM

## 2018-12-16 DIAGNOSIS — Q2112 Patent foramen ovale: Secondary | ICD-10-CM

## 2018-12-16 DIAGNOSIS — E669 Obesity, unspecified: Secondary | ICD-10-CM

## 2018-12-16 DIAGNOSIS — Z820 Family history of epilepsy and other diseases of the nervous system: Secondary | ICD-10-CM

## 2018-12-19 NOTE — Addendum Note (Signed)
Addended by: Star Age on: 12/19/2018 07:54 PM   Modules accepted: Orders

## 2018-12-19 NOTE — Procedures (Signed)
S PATIENT'S NAME:  Shelly [name], Rukaya DOB:      Apr 08, 1976      MR#:    [id number]     DATE OF RECORDING: 12/16/2018 REFERRING M.D.:  Edwina BarthMiguel Sagardia, MD Study Performed:   CPAP  Titration HISTORY: 43 year old woman with a history of TIA, PFO (s/p closure), migraine headaches, reflux disease, depression, anxiety, arthritis, sarcoidosis, anemia, allergies and obesity, who returns for a full night PAP titration study. Her baseline sleep study on 11/10/18 showed an AHI of 38.8/h, O2 nadir of 69%. The patient endorsed the Epworth Sleepiness Scale at 13 points. The patient's weight 196 pounds with a height of 64 (inches), resulting in a BMI of 33.5 kg/m2. The patient's neck circumference measured 17.15 inches.  CURRENT MEDICATIONS: Proventil, Aspirin, Lipitor, Zyrtec, Plavix, Estrace, Flonase, Singulair, Nicoderm, Compazine, Effexor, Zithromax  PROCEDURE:  This is a multichannel digital polysomnogram utilizing the SomnoStar 11.2 system.  Electrodes and sensors were applied and monitored per AASM Specifications.   EEG, EOG, Chin and Limb EMG, were sampled at 200 Hz.  ECG, Snore and Nasal Pressure, Thermal Airflow, Respiratory Effort, CPAP Flow and Pressure, Oximetry was sampled at 50 Hz. Digital video and audio were recorded.      The patient was fitted with a small N30 nasal cradle interface. CPAP was initiated at 5 cmH20 with heated humidity per AASM standards and pressure was advanced to 15 cmH20 because of hypopneas, apneas and desaturations.  At a PAP pressure of 13 cmH20, there was a reduction of the AHI to 0/hour with non-supine REM sleep achieved and O2 nadir of 93%.  Lights Out was at 22:03 and Lights On at 05:10. Total recording time (TRT) was 427.5 minutes, with a total sleep time (TST) of 378 minutes. The patient's sleep latency was 45 minutes, which is delayed. REM latency was 120.5 minutes, which is high normal.  The sleep efficiency was 88.4 %.    SLEEP ARCHITECTURE: WASO (Wake after sleep onset)  was 23 minutes with minimal to mild sleep fragmentation noted. There were 17 minutes in Stage N1, 72 minutes Stage N2, 167.5 minutes Stage N3 and 121.5 minutes in Stage REM. The percentage of Stage N1 was 4.5%, Stage N2 was 19.%, Stage N3 was 44.3%, which is increased, and Stage R (REM sleep) was 32.1%, which is increased and in keeping with rebound. The arousals were noted as: 46 were spontaneous, 0 were associated with PLMs, 3 were associated with respiratory events.  RESPIRATORY ANALYSIS:  There was a total of 5 respiratory events: 0 obstructive apneas, 1 central apneas and 1 mixed apneas with a total of 2 apneas and an apnea index (AI) of .3 /hour. There were 3 hypopneas with a hypopnea index of .5/hour. The patient also had 0 respiratory event related arousals (RERAs).      The total APNEA/HYPOPNEA INDEX  (AHI) was .8 /hour and the total RESPIRATORY DISTURBANCE INDEX was .8 /hour  1 events occurred in REM sleep and 4 events in NREM. The REM AHI was .5 /hour versus a non-REM AHI of .9 /hour.  The patient spent 0 minutes of total sleep time in the supine position and 378 minutes in non-supine. The supine AHI was 0.0, versus a non-supine AHI of 0.8.  OXYGEN SATURATION & C02:  The baseline 02 saturation was 96%, with the lowest being 83%. Time spent below 89% saturation equaled 0 minutes.  PERIODIC LIMB MOVEMENTS:  The patient had a total of 0 Periodic Limb Movements. The Periodic Limb Movement (PLM) index  was 0 and the PLM Arousal index was 0 /hour.  Audio and video analysis did not show any abnormal or unusual movements, behaviors, phonations or vocalizations. The patient took no bathroom breaks. The EKG was in keeping with normal sinus rhythm (NSR).  Post-study, the patient indicated that sleep was better than usual.   IMPRESSION:   1. Obstructive Sleep Apnea(OSA) 2. Dysfunctions associated with sleep stages or arousal from sleep   RECOMMENDATIONS:   1. This study demonstrates resolution of  the patient's obstructive sleep apnea with CPAP therapy. I will, therefore, start the patient on home CPAP treatment at a pressure of 13 cm via small nasal cradle interface with heated humidity. The patient should be reminded to be fully compliant with PAP therapy to improve sleep related symptoms and decrease long term cardiovascular risks. The patient should be reminded, that it may take up to 3 months to get fully used to using PAP with all planned sleep. The earlier full compliance is achieved, the better long term compliance tends to be. Please note that untreated obstructive sleep apnea may carry additional perioperative morbidity. Patients with significant obstructive sleep apnea should receive perioperative PAP therapy and the surgeons and particularly the anesthesiologist should be informed of the diagnosis and the severity of the sleep disordered breathing. 2. This study shows some sleep fragmentation and abnormal sleep stage percentages; these are nonspecific findings and per se do not signify an intrinsic sleep disorder or a cause for the patient's sleep-related symptoms. Causes include (but are not limited to) the first night effect of the sleep study, circadian rhythm disturbances, medication effect or an underlying mood disorder or medical problem.  3. The patient should be cautioned not to drive, work at heights, or operate dangerous or heavy equipment when tired or sleepy. Review and reiteration of good sleep hygiene measures should be pursued with any patient. 4. The patient will be seen in follow-up in the sleep clinic at [facility] for discussion of the test results, symptom and treatment compliance review, further management strategies, etc. The referring provider will be notified of the test results.   I certify that I have reviewed the entire raw data recording prior to the issuance of this report in accordance with the Standards of Accreditation of the American Academy of Sleep Medicine  (AASM)   Star Age, MD, PhD Diplomat, American Board of Neurology and Sleep Medicine (Neurology and Sleep Medicine)

## 2018-12-19 NOTE — Progress Notes (Signed)
Patient referred by Dr. Mitchel Honour, seen by me on 10/15/18, diagnostic PSG on 11/10/18.  Patient had a CPAP titration study on 12/16/18.  Please call and inform patient that I have entered an order for treatment with positive airway pressure (PAP) treatment for obstructive sleep apnea (OSA). She did well during the latest sleep study with CPAP. We will, therefore, arrange for a machine for home use through a DME (durable medical equipment) company of Her choice; and I will see the patient back in follow-up in about 10 weeks. Please also explain to the patient that I will be looking out for compliance data, which can be downloaded from the machine (stored on an SD card, that is inserted in the machine) or via remote access through a modem, that is built into the machine. At the time of the followup appointment we will discuss sleep study results and how it is going with PAP treatment at home. Please advise patient to bring Her machine at the time of the first FU visit, even though this is cumbersome. Bringing the machine for every visit after that will likely not be needed, but often helps for the first visit to troubleshoot if needed. Please re-enforce the importance of compliance with treatment and the need for [place] to monitor compliance data - often an insurance requirement and actually good feedback for the patient as far as how they are doing.  Also remind patient, that any interim PAP machine or mask issues should be first addressed with the DME company, as they can often help better with technical and mask fit issues. Please ask if patient has a preference regarding DME company.  Please also make sure, the patient has a follow-up appointment with me in about 10 weeks from the setup date, thanks. May see one of our nurse practitioners if needed for proper timing of the FU appointment.  Please fax or rout report to the referring provider. Thanks,   Star Age, MD, PhD [facility])

## 2018-12-24 ENCOUNTER — Encounter: Payer: Self-pay | Admitting: Neurology

## 2018-12-24 ENCOUNTER — Telehealth: Payer: Self-pay | Admitting: Neurology

## 2018-12-24 NOTE — Telephone Encounter (Signed)
Patient returned call. I advised pt that Dr. Rexene Alberts reviewed their sleep study results and found that pt has sleep apnea. Dr. Rexene Alberts recommends that pt starts CPAP at 13 cm water pressure. I reviewed PAP compliance expectations with the pt. Pt is agreeable to starting a CPAP. I advised pt that an order will be sent to a DME, [facility], and [employer] will call the pt within about one week after they file with the pt's insurance. [facility] will show the pt how to use the machine, fit for masks, and troubleshoot the CPAP if needed. A follow up appt was made for insurance purposes with Dr. Rexene Alberts on Nov 11,2020 at 1 pm. Pt verbalized understanding to arrive 15 minutes early and bring their CPAP. A letter with all of this information in it will be mailed to the pt as a reminder. I verified with the pt that the address we have on file is correct. Pt verbalized understanding of results. Pt had no questions at this time but was encouraged to call back if questions arise. I have sent the order to [facility] and have received confirmation that they have received the order.

## 2018-12-24 NOTE — Telephone Encounter (Signed)
Called patient to discuss sleep study results. No answer at this time. LVM for the patient to call back. Informed a mychart message it also sent.

## 2018-12-24 NOTE — Telephone Encounter (Signed)
-----   Message from Star Age, MD sent at 12/19/2018  7:54 PM EDT ----- Patient referred by Dr. Mitchel Honour, seen by me on 10/15/18, diagnostic PSG on 11/10/18.  Patient had a CPAP titration study on 12/16/18.  Please call and inform patient that I have entered an order for treatment with positive airway pressure (PAP) treatment for obstructive sleep apnea (OSA). She did well during the latest sleep study with CPAP. We will, therefore, arrange for a machine for home use through a DME (durable medical equipment) company of Her choice; and I will see the patient back in follow-up in about 10 weeks. Please also explain to the patient that I will be looking out for compliance data, which can be downloaded from the machine (stored on an SD card, that is inserted in the machine) or via remote access through a modem, that is built into the machine. At the time of the followup appointment we will discuss sleep study results and how it is going with PAP treatment at home. Please advise patient to bring Her machine at the time of the first FU visit, even though this is cumbersome. Bringing the machine for every visit after that will likely not be needed, but often helps for the first visit to troubleshoot if needed. Please re-enforce the importance of compliance with treatment and the need for [place] to monitor compliance data - often an insurance requirement and actually good feedback for the patient as far as how they are doing.  Also remind patient, that any interim PAP machine or mask issues should be first addressed with the DME company, as they can often help better with technical and mask fit issues. Please ask if patient has a preference regarding DME company.  Please also make sure, the patient has a follow-up appointment with me in about 10 weeks from the setup date, thanks. May see one of our nurse practitioners if needed for proper timing of the FU appointment.  Please fax or rout report to the referring provider.  Thanks,   Star Age, MD, PhD [facility])

## 2019-01-09 ENCOUNTER — Other Ambulatory Visit: Payer: Self-pay | Admitting: Emergency Medicine

## 2019-01-09 DIAGNOSIS — Z8709 Personal history of other diseases of the respiratory system: Secondary | ICD-10-CM

## 2019-01-13 ENCOUNTER — Encounter: Payer: Self-pay | Admitting: Emergency Medicine

## 2019-01-13 ENCOUNTER — Other Ambulatory Visit: Payer: Self-pay

## 2019-01-13 ENCOUNTER — Telehealth (INDEPENDENT_AMBULATORY_CARE_PROVIDER_SITE_OTHER): Payer: BC Managed Care – PPO | Admitting: Emergency Medicine

## 2019-01-13 VITALS — Ht 64.0 in | Wt 196.0 lb

## 2019-01-13 DIAGNOSIS — Z8709 Personal history of other diseases of the respiratory system: Secondary | ICD-10-CM

## 2019-01-13 DIAGNOSIS — R4582 Worries: Secondary | ICD-10-CM

## 2019-01-13 DIAGNOSIS — Z862 Personal history of diseases of the blood and blood-forming organs and certain disorders involving the immune mechanism: Secondary | ICD-10-CM | POA: Diagnosis not present

## 2019-01-13 NOTE — Progress Notes (Signed)
Called patient to triage for appointment. 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  Patient states school starts on 02/04/2019.

## 2019-01-13 NOTE — Progress Notes (Signed)
Telemedicine Encounter- SOAP NOTE Established Patient  This telephone encounter was conducted with the patient's (or proxy's) verbal consent via audio telecommunications: yes/no: Yes Patient was instructed to have this encounter in a suitably private space; and to only have persons present to whom they give permission to participate. In addition, patient identity was confirmed by use of name plus two identifiers (DOB and address).  I discussed the limitations, risks, security and privacy concerns of performing an evaluation and management service by telephone and the availability of in person appointments. I also discussed with the patient that there may be a patient responsible charge related to this service. The patient expressed understanding and agreed to proceed.  I spent a total of TIME; 0 MIN TO 60 MIN: 15 minutes talking with the patient or their proxy.  No chief complaint on file. Worried about COVID 19 infection  Subjective   Shelly [name] is a 43 y.o. female established patient. Telephone visit today concerned about COVID-19 infection.  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  Inquiring about her risk.  Patient has a history of asthma/sarcoidosis.  Not on immunosuppressive therapy.  Asymptomatic at present time. [id number] Risk of Complications Current as of today  2 0 - 2 Points: Low Risk  3 - 6 Points: Medium Risk  7 - 16 Points: High Risk    Last Change:     This score indicates the number of risk factors that a patient over the age of 51 has for severe illness or mortality from a COVID-19 infection. A lower score means fewer risk factors and is preferred.        Is Immunocompromised : No   Age: 68   Sex: Female   Has Chronic Pulmonary Disease: Yes   Has Congenital Heart Disease : No   Has Congestive Heart Failure: No   Has Coronary Artery Disease: No   Has Diabetes: No   Has End-Stage Liver Disease: No   Has End-Stage Renal Disease: No    Has Hypertension: No   Has Obesity: Yes   Resides in Nursing Home: No   Pregnant: No       HPI   Patient Active Problem List   Diagnosis Date Noted  . History of asthma 01/01/2018  . Chronic seasonal allergic rhinitis 01/01/2018  . Depression 09/07/2016  . 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 FO (foramen ovale) 09/09/2014  . Smoking addiction 06/05/2014  . Persistent asthma 06/05/2014  . GERD without esophagitis 06/05/2014  . SARCOIDOSIS 04/05/2007  . ARTHRITIS 04/05/2007    Past Medical History:  Diagnosis Date  . Allergy   . Anemia   . Anxiety   . Arthritis   . Depression   . GERD (gastroesophageal reflux disease)   . Migraines   . Miscarriage   . PFO (patent foramen ovale)   . Sarcoidosis   . Stroke ([place])   . TIA (transient ischemic attack)     Current Outpatient Medications  Medication Sig Dispense Refill  . albuterol (PROVENTIL HFA;VENTOLIN HFA) 108 (90 Base) MCG/ACT inhaler Inhale 1-2 puffs into the lungs every 4 (four) hours as needed. 6.7 g 0  . aspirin (GOODSENSE ASPIRIN) 325 MG tablet Take 325 mg by mouth.    Marland Kitchen atorvastatin (LIPITOR) 20 MG tablet Take 20 mg by mouth daily.    Marland Kitchen BREO ELLIPTA 200-25 MCG/INH AEPB INHALE 1 PUFF INTO THE LUNGS DAILY 60  each 11  . cetirizine (ZYRTEC) 10 MG tablet Take 10 mg by mouth daily.    . clopidogrel (PLAVIX) 75 MG tablet Take 75 mg by mouth daily.    Marland Kitchen. diltiazem (CARDIZEM) 120 MG tablet Take 120 mg by mouth daily.    Marland Kitchen. estradiol (ESTRACE) 2 MG tablet Take 2 mg by mouth daily.    . fluticasone (FLONASE) 50 MCG/ACT nasal spray Place 2 sprays into both nostrils daily. 16 g 12  . montelukast (SINGULAIR) 10 MG tablet TAKE 1 TABLET BY MOUTH EVERYDAY AT BEDTIME 90 tablet 0  . venlafaxine XR (EFFEXOR-XR) 150 MG 24 hr capsule Take 150 mg by mouth daily with breakfast.    . venlafaxine XR (EFFEXOR-XR) 75 MG 24 hr capsule Take 75 mg by mouth.    Marland Kitchen.  azithromycin (ZITHROMAX) 250 MG tablet Sig as indicated 6 tablet 0  . nicotine (NICODERM CQ) 14 mg/24hr patch Place 1 patch (14 mg total) onto the skin daily. 28 patch 3  . prochlorperazine (COMPAZINE) 10 MG tablet Take 1 tablet (10 mg total) by mouth every 8 (eight) hours as needed for nausea or vomiting. (Patient not taking: Reported on 01/13/2019) 20 tablet 0   No current facility-administered medications for this visit.     Allergies  Allergen Reactions  . Gramineae Pollens Hives  . Clindamycin   . Clindamycin/Lincomycin   . Keflex [Cephalexin]   . Lincomycin Hcl   . Penicillins   . Sulfa Antibiotics Hives  . Sulfonamide Derivatives     Social History   Socioeconomic History  . Marital status: Married    Spouse name: Not on file  . Number of children: 1  . Years of education: Not on file  . Highest education level: Not on file  Occupational History  . Not on file  Social Needs  . Financial resource strain: Not on file  . Food insecurity    Worry: Not on file    Inability: Not on file  . Transportation needs    Medical: Not on file    Non-medical: Not on file  Tobacco Use  . Smoking status: Current Every Day Smoker    Packs/day: 1.00    Years: 26.00    Pack years: 26.00    Types: Cigarettes  . Smokeless tobacco: Never Used  Substance and Sexual Activity  . Alcohol use: No    Alcohol/week: 0.0 standard drinks  . Drug use: No  . Sexual activity: Yes  Lifestyle  . Physical activity    Days per week: Not on file    Minutes per session: Not on file  . Stress: Not on file  Relationships  . Social Musicianconnections    Talks on phone: Not on file    Gets together: Not on file    Attends religious service: Not on file    Active member of club or organization: Not on file    Attends meetings of clubs or organizations: Not on file    Relationship status: Not on file  . Intimate partner violence    Fear of current or ex partner: Not on file    Emotionally abused: Not  on file    Physically abused: Not on file    Forced sexual activity: Not on file  Other Topics Concern  . Not on file  Social History Narrative  . Not on file    ROS  Objective  Alert and oriented x3 in no apparent respiratory distress. Vitals as reported by the patient: Today's  Vitals   01/13/19 1637  Weight: 196 lb (88.9 kg)  Height: 5\' 4"  (1.626 m)    There are no diagnoses linked to this encounter. Diagnoses and all orders for this visit:  Worries  History of sarcoidosis  History of asthma  COVID precautions given. COVID risk discussed with patient.  She has a score of 2 believed to be at low risk.   I discussed the assessment and treatment plan with the patient. The patient was provided an opportunity to ask questions and all were answered. The patient agreed with the plan and demonstrated an understanding of the instructions.   The patient was advised to call back or seek an in-person evaluation if the symptoms worsen or if the condition fails to improve as anticipated.  I provided 15 minutes of non-face-to-face time during this encounter.  , MD  Primary Care at [facility]

## 2019-02-03 ENCOUNTER — Other Ambulatory Visit: Payer: Self-pay | Admitting: Emergency Medicine

## 2019-02-03 DIAGNOSIS — F17200 Nicotine dependence, unspecified, uncomplicated: Secondary | ICD-10-CM

## 2019-02-11 ENCOUNTER — Encounter: Payer: Self-pay | Admitting: Emergency Medicine

## 2019-02-11 ENCOUNTER — Ambulatory Visit (INDEPENDENT_AMBULATORY_CARE_PROVIDER_SITE_OTHER): Payer: BC Managed Care – PPO | Admitting: Emergency Medicine

## 2019-02-11 ENCOUNTER — Other Ambulatory Visit: Payer: Self-pay

## 2019-02-11 ENCOUNTER — Ambulatory Visit: Payer: Self-pay

## 2019-02-11 VITALS — BP 114/74 | HR 151 | Temp 98.4°F | Resp 16 | Ht 64.0 in | Wt 209.6 lb

## 2019-02-11 DIAGNOSIS — Z7901 Long term (current) use of anticoagulants: Secondary | ICD-10-CM

## 2019-02-11 DIAGNOSIS — R0602 Shortness of breath: Secondary | ICD-10-CM

## 2019-02-11 DIAGNOSIS — I499 Cardiac arrhythmia, unspecified: Secondary | ICD-10-CM | POA: Diagnosis not present

## 2019-02-11 DIAGNOSIS — I4891 Unspecified atrial fibrillation: Secondary | ICD-10-CM

## 2019-02-11 DIAGNOSIS — H538 Other visual disturbances: Secondary | ICD-10-CM | POA: Diagnosis not present

## 2019-02-11 DIAGNOSIS — Z8673 Personal history of transient ischemic attack (TIA), and cerebral infarction without residual deficits: Secondary | ICD-10-CM

## 2019-02-11 DIAGNOSIS — H1132 Conjunctival hemorrhage, left eye: Secondary | ICD-10-CM

## 2019-02-11 DIAGNOSIS — F17200 Nicotine dependence, unspecified, uncomplicated: Secondary | ICD-10-CM

## 2019-02-11 DIAGNOSIS — Z862 Personal history of diseases of the blood and blood-forming organs and certain disorders involving the immune mechanism: Secondary | ICD-10-CM

## 2019-02-11 DIAGNOSIS — I6523 Occlusion and stenosis of bilateral carotid arteries: Secondary | ICD-10-CM

## 2019-02-11 NOTE — Progress Notes (Signed)
Cardiology Assessment & Plan: Assessment: 1. Paroxysmal atrial fibrillation with rapid ventricular rate: Associated with symptoms. We are going to start her on flecainide 50 mg 2 times a day and also continue with Cardizem CD 120 mg daily. And also since her CHA2DS VASC = 4 advise she takes anticoagulation with apixaban 5 mg 2 times a day.  2. status post percutaneous patent foramen ovale closure May 2020.  3. History of TIA.  4. Bilateral carotid artery stenosis: Mild by Doppler 06/17/2018. Continue taking Plavix.  5. Nicotine dependency. Discussed with her that she should quit smoking  6. History of migraine headache.  7. Pulmonary sarcoidosis. Following up with her primary physician.  Plan:  We are going to start her on flecainide 50 mg 2 times a day and Cardizem CD 120 mg daily, and also she needs apixaban 5 mg 2 times a day. And we will see her back again in 3 months to see how she is doing. Also we are going to get a EKG from her in 2 weeks.  Shelly [name] 43 y.o.   Chief Complaint  Patient presents with  . Headache    x 1 week   . Eye Problem    x 1 week with redness  . Shortness of Breath    started today    HISTORY OF PRESENT ILLNESS: This is a 43 y.o. female developed left-sided headache 1 week ago followed by intermittent bilateral blurred vision. Today she developed difficulty breathing with palpitations and blurred vision from left eye.  Denies syncope.  Denies chest pain. Cardiology visit on 01/24/2019 reviewed (see above). Presently on Plavix and Eliquis.  Has history of TIA in the past. Was recently started on flecainide and continued on Cardizem.  Status post PFO closure last May. Has history of sarcoidosis, well controlled.  No steroids. Smoker.  Trying to quit with NicoDerm.  HPI   Prior to Admission medications   Medication Sig Start Date End Date Taking? Authorizing Provider  albuterol (PROVENTIL HFA;VENTOLIN HFA) 108 (90 Base) MCG/ACT inhaler Inhale  1-2 puffs into the lungs every 4 (four) hours as needed. 07/16/17  Yes Barnett AbuWiseman, [place] D, PA-C  Apixaban (ELIQUIS PO) Take by mouth.   Yes [provider]  aspirin (GOODSENSE ASPIRIN) 325 MG tablet Take 325 mg by mouth.   Yes [provider]  atorvastatin (LIPITOR) 20 MG tablet Take 20 mg by mouth daily.   Yes [provider]  Earlie ServerBREO ELLIPTA [phone] MCG/INH AEPB INHALE 1 PUFF INTO THE LUNGS DAILY 01/09/19  Yes Cherese Lozano, Eilleen KempfMiguel Jose, MD  cetirizine (ZYRTEC) 10 MG tablet Take 10 mg by mouth daily.   Yes [provider]  clopidogrel (PLAVIX) 75 MG tablet Take 75 mg by mouth daily.   Yes [provider]  diltiazem (CARDIZEM) 120 MG tablet Take 120 mg by mouth daily.   Yes [provider]  estradiol (ESTRACE) 2 MG tablet Take 2 mg by mouth daily.   Yes [provider]  fluticasone (FLONASE) 50 MCG/ACT nasal spray Place 2 sprays into both nostrils daily. 01/01/18  Yes Sussie Minor, Eilleen KempfMiguel Jose, MD  montelukast (SINGULAIR) 10 MG tablet TAKE 1 TABLET BY MOUTH EVERYDAY AT BEDTIME 12/10/18  Yes Jalayla Chrismer, Eilleen KempfMiguel Jose, MD  venlafaxine XR (EFFEXOR-XR) 150 MG 24 hr capsule Take 150 mg by mouth daily with breakfast.   Yes [provider]  venlafaxine XR (EFFEXOR-XR) 75 MG 24 hr capsule Take 75 mg by mouth. 08/22/16  Yes [provider]  azithromycin Christena Deem(ZITHROMAX)  250 MG tablet Sig as indicated 09/12/18   Georgina Quint, MD  nicotine (NICODERM CQ - DOSED IN MG/24 HOURS) 14 mg/24hr patch PLACE 1 PATCH (14 MG TOTAL) ONTO THE SKIN DAILY. Patient not taking: Reported on 02/11/2019 02/03/19   Georgina Quint, MD  prochlorperazine (COMPAZINE) 10 MG tablet Take 1 tablet (10 mg total) by mouth every 8 (eight) hours as needed for nausea or vomiting. Patient not taking: Reported on 02/11/2019 05/20/18   Myles Lipps, MD    Allergies  Allergen Reactions  . Gramineae Pollens Hives  . Clindamycin   . Clindamycin/Lincomycin   . Keflex  [Cephalexin]   . Lincomycin Hcl   . Penicillins   . Sulfa Antibiotics Hives  . Sulfonamide Derivatives     Patient Active Problem List   Diagnosis Date Noted  . History of asthma 01/01/2018  . Chronic seasonal allergic rhinitis 01/01/2018  . Depression 09/07/2016  . 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 FO (foramen ovale) 09/09/2014  . Smoking addiction 06/05/2014  . SARCOIDOSIS 04/05/2007    Past Medical History:  Diagnosis Date  . Allergy   . Anemia   . Anxiety   . Arthritis   . Depression   . GERD (gastroesophageal reflux disease)   . Migraines   . Miscarriage   . PFO (patent foramen ovale)   . Sarcoidosis   . Stroke (HCC)   . TIA (transient ischemic attack)     Past Surgical History:  Procedure Laterality Date  . BRONCHOSCOPY    . CESAREAN SECTION     miscarriage  . CHOLECYSTECTOMY    . HAND SURGERY    . miscarriage  08/10/14  . NASAL SINUS SURGERY      Social History   Socioeconomic History  . Marital status: Married    Spouse name: Not on file  . Number of children: 1  . Years of education: Not on file  . Highest education level: Not on file  Occupational History  . Not on file  Social Needs  . Financial resource strain: Not on file  . Food insecurity    Worry: Not on file    Inability: Not on file  . Transportation needs    Medical: Not on file    Non-medical: Not on file  Tobacco Use  . Smoking status: Current Every Day Smoker    Packs/day: 1.00    Years: 26.00    Pack years: 26.00    Types: Cigarettes  . Smokeless tobacco: Never Used  Substance and Sexual Activity  . Alcohol use: No    Alcohol/week: 0.0 standard drinks  . Drug use: No  . Sexual activity: Yes  Lifestyle  . Physical activity    Days per week: Not on file    Minutes per session: Not on file  . Stress: Not on file  Relationships  . Social Musician on phone: Not on file     Gets together: Not on file    Attends religious service: Not on file    Active member of club or organization: Not on file    Attends meetings of clubs or organizations: Not on file    Relationship status: Not on file  . Intimate partner violence    Fear of current or ex partner: Not on file    Emotionally abused: Not on file    Physically abused: Not on file    Forced  sexual activity: Not on file  Other Topics Concern  . Not on file  Social History Narrative  . Not on file    Family History  Problem Relation Age of Onset  . Osteoporosis Mother   . Asthma Mother   . Diabetes Father   . Heart disease Father   . Kidney disease Maternal Grandmother   . Heart disease Maternal Grandfather   . Cancer Paternal Grandmother   . Heart disease Paternal Grandfather      Review of Systems  Constitutional: Negative.  Negative for chills and fever.  HENT: Negative.  Negative for congestion and sore throat.   Eyes: Positive for blurred vision.  Respiratory: Positive for shortness of breath. Negative for cough and hemoptysis.   Cardiovascular: Positive for palpitations. Negative for chest pain.  Gastrointestinal: Negative.  Negative for abdominal pain, diarrhea, nausea and vomiting.  Musculoskeletal: Negative.  Negative for myalgias.  Skin: Negative.  Negative for rash.  Neurological: Positive for headaches. Negative for dizziness, sensory change, focal weakness, seizures and loss of consciousness.  Endo/Heme/Allergies: Negative.   All other systems reviewed and are negative.  Vitals:   02/11/19 1050  BP: 114/74  Pulse: (!) 151  Resp: 16  Temp: 98.4 F (36.9 C)  SpO2: 96%     Physical Exam Vitals signs reviewed.  Constitutional:      Appearance: She is well-developed.  HENT:     Head: Normocephalic.  Eyes:     Extraocular Movements: Extraocular movements intact.     Pupils: Pupils are equal, round, and reactive to light.     Comments: Left-sided subconjunctival hemorrhage   Neck:     Musculoskeletal: Normal range of motion and neck supple.  Cardiovascular:     Rate and Rhythm: Tachycardia present. Rhythm irregular.     Heart sounds: No murmur.  Pulmonary:     Effort: Pulmonary effort is normal.     Breath sounds: Normal breath sounds.  Abdominal:     Palpations: Abdomen is soft.     Tenderness: There is no abdominal tenderness.  Musculoskeletal: Normal range of motion.     Right lower leg: No edema.     Left lower leg: No edema.  Skin:    General: Skin is warm and dry.     Capillary Refill: Capillary refill takes less than 2 seconds.  Neurological:     General: No focal deficit present.     Mental Status: She is alert and oriented to person, place, and time.  Psychiatric:        Mood and Affect: Mood normal.        Behavior: Behavior normal.    EKG: Atrial flutter with 2-1 conduction and ventricular rate 150/min.  ASSESSMENT & PLAN: Advised to go to the ER for further evaluation and treatment.  EMS called and case discussed with paramedics.  Shelly [name] was seen today for headache, eye problem and shortness of breath.  Diagnoses and all orders for this visit:  Atrial fibrillation with rapid ventricular response (HCC) Comments: Symptomatic  Shortness of breath  Irregular heart beat -     EKG 12-Lead  Blurred vision, left eye  History of TIA (transient ischemic attack)  Current use of long term anticoagulation  History of sarcoidosis  Subconjunctival hemorrhage of left eye  Bilateral carotid artery stenosis  Smoking addiction     11:45am transported to the ER via EMS.  Agustina Caroli, MD [facility]

## 2019-02-11 NOTE — Telephone Encounter (Signed)
Pt. Reports this morning her left eye developed redness to "the white part of my left eye all of a sudden." Mild ache to eye. No coughing or vomiting recently. No drainage from eye.Warm transfer to [facility] in the practice for a visit.  Answer Assessment - Initial Assessment Questions 1. LOCATION: Location: "What's red, the eyeball or the outer eyelids?" (Note: when callers say the eye is red, they usually mean the sclera is red)       Red on th e eyeball 2. REDNESS OF SCLERA: "Is the redness in one or both eyes?" "When did the redness start?"      Left eye 3. ONSET: "When did the eye become red?" (e.g., hours, days)      This morning 4. EYELIDS: "Are the eyelids red or swollen?" If so, ask: "How much?"      No 5. VISION: "Is there any difficulty seeing clearly?"      Blurry in that 6. ITCHING: "Does it feel itchy?" If so ask: "How bad is it" (e.g., Scale 1-10; or mild, moderate, severe)     No 7. PAIN: "Is there any pain? If so, ask: "How bad is it?" (e.g., Scale 1-10; or mild, moderate, severe)     Mild ache 8. CONTACT LENS: "Do you wear contacts?"     No 9. CAUSE: "What do you think is causing the redness?"     Unsure 10. OTHER SYMPTOMS: "Do you have any other symptoms?" (e.g., fever, runny nose, cough, vomiting)       No  Protocols used: EYE - RED WITHOUT PUS-A-AH

## 2019-02-11 NOTE — Patient Instructions (Signed)
° ° ° °  If you have lab work done today you will be contacted with your lab results within the next 2 weeks.  If you have not heard from us then please contact us. The fastest way to get your results is to register for My Chart. ° ° °IF you received an x-ray today, you will receive an invoice from [facility]. Please contact [facility] at [phone] with questions or concerns regarding your invoice.  ° °IF you received labwork today, you will receive an invoice from [facility]. Please contact [facility] at [phone] with questions or concerns regarding your invoice.  ° °Our billing staff will not be able to assist you with questions regarding bills from these companies. ° °You will be contacted with the lab results as soon as they are available. The fastest way to get your results is to activate your My Chart account. Instructions are located on the last page of this paperwork. If you have not heard from us regarding the results in 2 weeks, please contact this office. °  ° ° ° °

## 2019-02-19 ENCOUNTER — Other Ambulatory Visit: Payer: Self-pay

## 2019-02-19 ENCOUNTER — Ambulatory Visit (INDEPENDENT_AMBULATORY_CARE_PROVIDER_SITE_OTHER): Payer: BC Managed Care – PPO | Admitting: Emergency Medicine

## 2019-02-19 ENCOUNTER — Encounter: Payer: Self-pay | Admitting: Emergency Medicine

## 2019-02-19 VITALS — BP 106/69 | HR 65 | Temp 98.9°F | Resp 16 | Ht 64.0 in | Wt 201.6 lb

## 2019-02-19 DIAGNOSIS — Z8709 Personal history of other diseases of the respiratory system: Secondary | ICD-10-CM

## 2019-02-19 DIAGNOSIS — I483 Typical atrial flutter: Secondary | ICD-10-CM | POA: Diagnosis not present

## 2019-02-19 DIAGNOSIS — Z09 Encounter for follow-up examination after completed treatment for conditions other than malignant neoplasm: Secondary | ICD-10-CM | POA: Diagnosis not present

## 2019-02-19 DIAGNOSIS — Z862 Personal history of diseases of the blood and blood-forming organs and certain disorders involving the immune mechanism: Secondary | ICD-10-CM | POA: Diagnosis not present

## 2019-02-19 DIAGNOSIS — Z7901 Long term (current) use of anticoagulants: Secondary | ICD-10-CM

## 2019-02-19 DIAGNOSIS — Z87891 Personal history of nicotine dependence: Secondary | ICD-10-CM

## 2019-02-19 DIAGNOSIS — Z8679 Personal history of other diseases of the circulatory system: Secondary | ICD-10-CM

## 2019-02-19 DIAGNOSIS — Z8659 Personal history of other mental and behavioral disorders: Secondary | ICD-10-CM

## 2019-02-19 NOTE — Progress Notes (Signed)
Shelly [name] 43 y.o.   Chief Complaint  Patient presents with  . Atrial Fibrillation    follow up hospital IP    HISTORY OF PRESENT ILLNESS: This is a 43 y.o. female here for hospital follow-up. Seen by me on 02/11/2019 with fast A. fib flutter.  Sent to the hospital via EMS. A. fib flutter controlled with medication and cardioversion.  Presently on anticoagulants including Eliquis 5 mg twice daily, aspirin, and Plavix.  On flecainide 100 mg twice a day and metoprolol 50 mg twice a day. Smoker with a history of COPD.  Trying to quit.  On NicoDerm CQ patches. History of sarcoidosis. History of depression, on Effexor for about 17 years. Today feels much better, has no complaints, asymptomatic.  HPI   Prior to Admission medications   Medication Sig Start Date End Date Taking? Authorizing Provider  albuterol (PROVENTIL HFA;VENTOLIN HFA) 108 (90 Base) MCG/ACT inhaler Inhale 1-2 puffs into the lungs every 4 (four) hours as needed. 07/16/17  Yes Barnett Abu, [place] D, PA-C  Apixaban (ELIQUIS PO) Take 5 mg by mouth 2 (two) times daily.    Yes [provider]  aspirin (GOODSENSE ASPIRIN) 325 MG tablet Take 325 mg by mouth.   Yes [provider]  atorvastatin (LIPITOR) 20 MG tablet Take 20 mg by mouth daily.   Yes [provider]  Earlie Server [id number] MCG/INH AEPB INHALE 1 PUFF INTO THE LUNGS DAILY 01/09/19  Yes Boris Engelmann, Eilleen Kempf, MD  cetirizine (ZYRTEC) 10 MG tablet Take 10 mg by mouth daily.   Yes [provider]  clopidogrel (PLAVIX) 75 MG tablet Take 75 mg by mouth daily.   Yes [provider]  diltiazem (CARDIZEM) 120 MG tablet Take 120 mg by mouth daily.   Yes [provider]  estradiol (ESTRACE) 2 MG tablet Take 2 mg by mouth daily.   Yes [provider]  flecainide (TAMBOCOR) 100 MG tablet Take 100 mg by mouth 2 (two) times daily.   Yes [provider]  fluticasone (FLONASE) 50 MCG/ACT nasal spray Place 2 sprays  into both nostrils daily. 01/01/18  Yes Kearstyn Avitia, Eilleen Kempf, MD  metoprolol tartrate (LOPRESSOR) 50 MG tablet Take 50 mg by mouth 2 (two) times daily.   Yes [provider]  montelukast (SINGULAIR) 10 MG tablet TAKE 1 TABLET BY MOUTH EVERYDAY AT BEDTIME 12/10/18  Yes Lannah Koike, Eilleen Kempf, MD  nicotine (NICODERM CQ - DOSED IN MG/24 HOURS) 14 mg/24hr patch PLACE 1 PATCH (14 MG TOTAL) ONTO THE SKIN DAILY. 02/03/19  Yes Marrah Vanevery, Eilleen Kempf, MD  prochlorperazine (COMPAZINE) 10 MG tablet Take 1 tablet (10 mg total) by mouth every 8 (eight) hours as needed for nausea or vomiting. 05/20/18  Yes Myles Lipps, MD  venlafaxine XR (EFFEXOR-XR) 150 MG 24 hr capsule Take 150 mg by mouth daily with breakfast.   Yes [provider]  venlafaxine XR (EFFEXOR-XR) 75 MG 24 hr capsule Take 75 mg by mouth. 08/22/16  Yes [provider]  azithromycin (ZITHROMAX) 250 MG tablet Sig as indicated 09/12/18   Georgina Quint, MD    Allergies  Allergen Reactions  . Gramineae Pollens Hives  . Clindamycin   . Clindamycin/Lincomycin   . Keflex [Cephalexin]   . Lincomycin Hcl   . Penicillins   . Sulfa Antibiotics Hives  . Sulfonamide Derivatives     Patient Active Problem List   Diagnosis Date Noted  . History of asthma 01/01/2018  . Chronic seasonal allergic rhinitis 01/01/2018  .  Depression 09/07/2016  . 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 Smoking addiction 06/05/2014  . SARCOIDOSIS 04/05/2007    Past Medical History:  Diagnosis Date  . Allergy   . Anemia   . Anxiety   . Arthritis   . Depression   . GERD (gastroesophageal reflux disease)   . Migraines   . Miscarriage   . PFO (patent foramen ovale)   . Sarcoidosis   . Stroke (HCC)   . TIA (transient ischemic attack)     Past Surgical History:  Procedure Laterality Date  . BRONCHOSCOPY    . CESAREAN SECTION     miscarriage  .  CHOLECYSTECTOMY    . HAND SURGERY    . miscarriage  08/10/14  . NASAL SINUS SURGERY      Social History   Socioeconomic History  . Marital status: Married    Spouse name: Not on file  . Number of children: 1  . Years of education: Not on file  . Highest education level: Not on file  Occupational History  . Not on file  Social Needs  . Financial resource strain: Not on file  . Food insecurity    Worry: Not on file    Inability: Not on file  . Transportation needs    Medical: Not on file    Non-medical: Not on file  Tobacco Use  . Smoking status: Current Every Day Smoker    Packs/day: 1.00    Years: 26.00    Pack years: 26.00    Types: Cigarettes  . Smokeless tobacco: Never Used  Substance and Sexual Activity  . Alcohol use: No    Alcohol/week: 0.0 standard drinks  . Drug use: No  . Sexual activity: Yes  Lifestyle  . Physical activity    Days per week: Not on file    Minutes per session: Not on file  . Stress: Not on file  Relationships  . Social Musicianconnections    Talks on phone: Not on file    Gets together: Not on file    Attends religious service: Not on file    Active member of club or organization: Not on file    Attends meetings of clubs or organizations: Not on file    Relationship status: Not on file  . Intimate partner violence    Fear of current or ex partner: Not on file    Emotionally abused: Not on file    Physically abused: Not on file    Forced sexual activity: Not on file  Other Topics Concern  . Not on file  Social History Narrative  . Not on file    Family History  Problem Relation Age of Onset  . Osteoporosis Mother   . Asthma Mother   . Diabetes Father   . Heart disease Father   . Kidney disease Maternal Grandmother   . Heart disease Maternal Grandfather   . Cancer Paternal Grandmother   . Heart disease Paternal Grandfather      Review of Systems  Constitutional: Negative.  Negative for chills and fever.  HENT: Negative for  congestion and sore throat.   Eyes: Negative.  Negative for blurred vision and double vision.  Respiratory: Negative.  Negative for cough and shortness of breath.   Cardiovascular: Negative.  Negative for chest pain and palpitations.  Gastrointestinal: Negative.  Negative for abdominal pain, blood in stool, diarrhea, nausea and vomiting.  Genitourinary: Negative.  Negative for dysuria  and hematuria.  Musculoskeletal: Negative.  Negative for back pain, myalgias and neck pain.  Skin: Negative.  Negative for rash.  Neurological: Negative.  Negative for dizziness and headaches.  All other systems reviewed and are negative.  Vitals:   02/19/19 1006  BP: 106/69  Pulse: 65  Resp: 16  Temp: 98.9 F (37.2 C)  SpO2: 97%     Physical Exam Vitals signs reviewed.  Constitutional:      Appearance: Normal appearance. She is obese.  HENT:     Head: Normocephalic.  Eyes:     Extraocular Movements: Extraocular movements intact.     Conjunctiva/sclera: Conjunctivae normal.     Pupils: Pupils are equal, round, and reactive to light.  Neck:     Musculoskeletal: Normal range of motion and neck supple.  Cardiovascular:     Rate and Rhythm: Normal rate and regular rhythm.     Pulses: Normal pulses.     Heart sounds: Normal heart sounds.  Pulmonary:     Effort: Pulmonary effort is normal.  Musculoskeletal: Normal range of motion.  Skin:    General: Skin is warm and dry.     Capillary Refill: Capillary refill takes less than 2 seconds.  Neurological:     General: No focal deficit present.     Mental Status: She is alert and oriented to person, place, and time.  Psychiatric:        Mood and Affect: Mood normal.        Behavior: Behavior normal.    A total of 25 minutes was spent in the room with the patient, greater than 50% of which was in counseling/coordination of care regarding review of recent hospitalization medical records and results, review of present medications with doses and side  effects, diet and nutrition, review of long-term anticoagulants with side effects and precautions, need for follow-up with her cardiologist and follow-up here in 3 months.   ASSESSMENT & PLAN:  Farin was seen today for atrial fibrillation.  Diagnoses and all orders for this visit:  Hospital discharge follow-up  Current use of long term anticoagulation  History of sarcoidosis  History of atrial fibrillation  History of asthma  History of smoking  History of depression    Patient Instructions       If you have lab work done today you will be contacted with your lab results within the next 2 weeks.  If you have not heard from [place] then please contact us. The fastest way to get your results is to register for My Chart.   IF you received an x-ray today, you will receive an invoice from [facility]. Please contact [facility] at [phone] with questions or concerns regarding your invoice.   IF you received labwork today, you will receive an invoice from [facility]. Please contact [facility] at [phone] with questions or concerns regarding your invoice.   Our billing staff will not be able to assist you with questions regarding bills from these companies.  You will be contacted with the lab results as soon as they are available. The fastest way to get your results is to activate your My Chart account. Instructions are located on the last page of this paperwork. If you have not heard from [place] regarding the results in 2 weeks, please contact this office.     Health Maintenance, Female Adopting a healthy lifestyle and getting preventive care are important in promoting health and wellness. Ask your health care provider about:  The right schedule for  you to have regular tests and exams.  Things you can do on your own to prevent diseases and keep yourself healthy. What should I know about diet, weight, and exercise? Eat a healthy diet   Eat a diet that  includes plenty of vegetables, fruits, low-fat dairy products, and lean protein.  Do not eat a lot of foods that are high in solid fats, added sugars, or sodium. Maintain a healthy weight Body mass index (BMI) is used to identify weight problems. It estimates body fat based on height and weight. Your health care provider can help determine your BMI and help you achieve or maintain a healthy weight. Get regular exercise Get regular exercise. This is one of the most important things you can do for your health. Most adults should:  Exercise for at least 150 minutes each week. The exercise should increase your heart rate and make you sweat (moderate-intensity exercise).  Do strengthening exercises at least twice a week. This is in addition to the moderate-intensity exercise.  Spend less time sitting. Even light physical activity can be beneficial. Watch cholesterol and blood lipids Have your blood tested for lipids and cholesterol at 45 years of age, then have this test every 5 years. Have your cholesterol levels checked more often if:  Your lipid or cholesterol levels are high.  You are older than 45 years of age.  You are at high risk for heart disease. What should I know about cancer screening? Depending on your health history and family history, you may need to have cancer screening at various ages. This may include screening for:  Breast cancer.  Cervical cancer.  Colorectal cancer.  Skin cancer.  Lung cancer. What should I know about heart disease, diabetes, and high blood pressure? Blood pressure and heart disease  High blood pressure causes heart disease and increases the risk of stroke. This is more likely to develop in people who have high blood pressure readings, are of African descent, or are overweight.  Have your blood pressure checked: ? Every 3-5 years if you are 35-32 years of age. ? Every year if you are 64 years old or older. Diabetes Have regular diabetes  screenings. This checks your fasting blood sugar level. Have the screening done:  Once every three years after age 47 if you are at a normal weight and have a low risk for diabetes.  More often and at a younger age if you are overweight or have a high risk for diabetes. What should I know about preventing infection? Hepatitis B If you have a higher risk for hepatitis B, you should be screened for this virus. Talk with your health care provider to find out if you are at risk for hepatitis B infection. Hepatitis C Testing is recommended for:  Everyone born from [birth year] through 1967.  Anyone with known risk factors for hepatitis C. Sexually transmitted infections (STIs)  Get screened for STIs, including gonorrhea and chlamydia, if: ? You are sexually active and are younger than 45 years of age. ? You are older than 45 years of age and your health care provider tells you that you are at risk for this type of infection. ? Your sexual activity has changed since you were last screened, and you are at increased risk for chlamydia or gonorrhea. Ask your health care provider if you are at risk.  Ask your health care provider about whether you are at high risk for HIV. Your health care provider may recommend a prescription medicine  to help prevent HIV infection. If you choose to take medicine to prevent HIV, you should first get tested for HIV. You should then be tested every 3 months for as long as you are taking the medicine. Pregnancy  If you are about to stop having your period (premenopausal) and you may become pregnant, seek counseling before you get pregnant.  Take 400 to 800 micrograms (mcg) of folic acid every day if you become pregnant.  Ask for birth control (contraception) if you want to prevent pregnancy. Osteoporosis and menopause Osteoporosis is a disease in which the bones lose minerals and strength with aging. This can result in bone fractures. If you are 42 years old or older, or if  you are at risk for osteoporosis and fractures, ask your health care provider if you should:  Be screened for bone loss.  Take a calcium or vitamin D supplement to lower your risk of fractures.  Be given hormone replacement therapy (HRT) to treat symptoms of menopause. Follow these instructions at home: Lifestyle  Do not use any products that contain nicotine or tobacco, such as cigarettes, e-cigarettes, and chewing tobacco. If you need help quitting, ask your health care provider.  Do not use street drugs.  Do not share needles.  Ask your health care provider for help if you need support or information about quitting drugs. Alcohol use  Do not drink alcohol if: ? Your health care provider tells you not to drink. ? You are pregnant, may be pregnant, or are planning to become pregnant.  If you drink alcohol: ? Limit how much you use to 0-1 drink a day. ? Limit intake if you are breastfeeding.  Be aware of how much alcohol is in your drink. In the [place] one drink equals one 12 oz bottle of beer (355 mL), one 5 oz glass of wine (148 mL), or one 1 oz glass of hard liquor (44 mL). General instructions  Schedule regular health, dental, and eye exams.  Stay current with your vaccines.  Tell your health care provider if: ? You often feel depressed. ? You have ever been abused or do not feel safe at home. Summary  Adopting a healthy lifestyle and getting preventive care are important in promoting health and wellness.  Follow your health care provider's instructions about healthy diet, exercising, and getting tested or screened for diseases.  Follow your health care provider's instructions on monitoring your cholesterol and blood pressure. This information is not intended to replace advice given to you by your health care provider. Make sure you discuss any questions you have with your health care provider. Document Released: 10/17/2010 Document Revised: 03/27/2018 Document  Reviewed: 03/27/2018 Elsevier Patient Education  2020 Elsevier Inc.     Edwina Barth, MD [facility]

## 2019-02-19 NOTE — Patient Instructions (Addendum)
   If you have lab work done today you will be contacted with your lab results within the next 2 weeks.  If you have not heard from us then please contact us. The fastest way to get your results is to register for My Chart.   IF you received an x-ray today, you will receive an invoice from [facility]. Please contact [facility] at [phone] with questions or concerns regarding your invoice.   IF you received labwork today, you will receive an invoice from [facility]. Please contact [facility] at [phone] with questions or concerns regarding your invoice.   Our billing staff will not be able to assist you with questions regarding bills from these companies.  You will be contacted with the lab results as soon as they are available. The fastest way to get your results is to activate your My Chart account. Instructions are located on the last page of this paperwork. If you have not heard from us regarding the results in 2 weeks, please contact this office.      Health Maintenance, Female Adopting a healthy lifestyle and getting preventive care are important in promoting health and wellness. Ask your health care provider about:  The right schedule for you to have regular tests and exams.  Things you can do on your own to prevent diseases and keep yourself healthy. What should I know about diet, weight, and exercise? Eat a healthy diet   Eat a diet that includes plenty of vegetables, fruits, low-fat dairy products, and lean protein.  Do not eat a lot of foods that are high in solid fats, added sugars, or sodium. Maintain a healthy weight Body mass index (BMI) is used to identify weight problems. It estimates body fat based on height and weight. Your health care provider can help determine your BMI and help you achieve or maintain a healthy weight. Get regular exercise Get regular exercise. This is one of the most important things you can do for your health. Most  adults should:  Exercise for at least 150 minutes each week. The exercise should increase your heart rate and make you sweat (moderate-intensity exercise).  Do strengthening exercises at least twice a week. This is in addition to the moderate-intensity exercise.  Spend less time sitting. Even light physical activity can be beneficial. Watch cholesterol and blood lipids Have your blood tested for lipids and cholesterol at 45 years of age, then have this test every 5 years. Have your cholesterol levels checked more often if:  Your lipid or cholesterol levels are high.  You are older than 45 years of age.  You are at high risk for heart disease. What should I know about cancer screening? Depending on your health history and family history, you may need to have cancer screening at various ages. This may include screening for:  Breast cancer.  Cervical cancer.  Colorectal cancer.  Skin cancer.  Lung cancer. What should I know about heart disease, diabetes, and high blood pressure? Blood pressure and heart disease  High blood pressure causes heart disease and increases the risk of stroke. This is more likely to develop in people who have high blood pressure readings, are of African descent, or are overweight.  Have your blood pressure checked: ? Every 3-5 years if you are 18-39 years of age. ? Every year if you are 40 years old or older. Diabetes Have regular diabetes screenings. This checks your fasting blood sugar level. Have the screening done:  Once every   three years after age 40 if you are at a normal weight and have a low risk for diabetes.  More often and at a younger age if you are overweight or have a high risk for diabetes. What should I know about preventing infection? Hepatitis B If you have a higher risk for hepatitis B, you should be screened for this virus. Talk with your health care provider to find out if you are at risk for hepatitis B infection. Hepatitis  C Testing is recommended for:  Everyone born from 1945 through 1965.  Anyone with known risk factors for hepatitis C. Sexually transmitted infections (STIs)  Get screened for STIs, including gonorrhea and chlamydia, if: ? You are sexually active and are younger than 45 years of age. ? You are older than 45 years of age and your health care provider tells you that you are at risk for this type of infection. ? Your sexual activity has changed since you were last screened, and you are at increased risk for chlamydia or gonorrhea. Ask your health care provider if you are at risk.  Ask your health care provider about whether you are at high risk for HIV. Your health care provider may recommend a prescription medicine to help prevent HIV infection. If you choose to take medicine to prevent HIV, you should first get tested for HIV. You should then be tested every 3 months for as long as you are taking the medicine. Pregnancy  If you are about to stop having your period (premenopausal) and you may become pregnant, seek counseling before you get pregnant.  Take 400 to 800 micrograms (mcg) of folic acid every day if you become pregnant.  Ask for birth control (contraception) if you want to prevent pregnancy. Osteoporosis and menopause Osteoporosis is a disease in which the bones lose minerals and strength with aging. This can result in bone fractures. If you are 65 years old or older, or if you are at risk for osteoporosis and fractures, ask your health care provider if you should:  Be screened for bone loss.  Take a calcium or vitamin D supplement to lower your risk of fractures.  Be given hormone replacement therapy (HRT) to treat symptoms of menopause. Follow these instructions at home: Lifestyle  Do not use any products that contain nicotine or tobacco, such as cigarettes, e-cigarettes, and chewing tobacco. If you need help quitting, ask your health care provider.  Do not use street  drugs.  Do not share needles.  Ask your health care provider for help if you need support or information about quitting drugs. Alcohol use  Do not drink alcohol if: ? Your health care provider tells you not to drink. ? You are pregnant, may be pregnant, or are planning to become pregnant.  If you drink alcohol: ? Limit how much you use to 0-1 drink a day. ? Limit intake if you are breastfeeding.  Be aware of how much alcohol is in your drink. In the [place] one drink equals one 12 oz bottle of beer (355 mL), one 5 oz glass of wine (148 mL), or one 1 oz glass of hard liquor (44 mL). General instructions  Schedule regular health, dental, and eye exams.  Stay current with your vaccines.  Tell your health care provider if: ? You often feel depressed. ? You have ever been abused or do not feel safe at home. Summary  Adopting a healthy lifestyle and getting preventive care are important in promoting health and wellness.    Follow your health care provider's instructions about healthy diet, exercising, and getting tested or screened for diseases.  Follow your health care provider's instructions on monitoring your cholesterol and blood pressure. This information is not intended to replace advice given to you by your health care provider. Make sure you discuss any questions you have with your health care provider. Document Released: 10/17/2010 Document Revised: 03/27/2018 Document Reviewed: 03/27/2018 Elsevier Patient Education  2020 Elsevier Inc.  

## 2019-02-24 ENCOUNTER — Encounter: Payer: Self-pay | Admitting: Neurology

## 2019-02-26 ENCOUNTER — Ambulatory Visit (INDEPENDENT_AMBULATORY_CARE_PROVIDER_SITE_OTHER): Payer: BC Managed Care – PPO | Admitting: Neurology

## 2019-02-26 ENCOUNTER — Other Ambulatory Visit: Payer: Self-pay

## 2019-02-26 ENCOUNTER — Encounter: Payer: Self-pay | Admitting: Neurology

## 2019-02-26 VITALS — BP 139/84 | HR 80 | Temp 97.2°F | Ht 64.0 in | Wt 200.0 lb

## 2019-02-26 DIAGNOSIS — Z8679 Personal history of other diseases of the circulatory system: Secondary | ICD-10-CM | POA: Diagnosis not present

## 2019-02-26 DIAGNOSIS — Z9989 Dependence on other enabling machines and devices: Secondary | ICD-10-CM

## 2019-02-26 DIAGNOSIS — G4733 Obstructive sleep apnea (adult) (pediatric): Secondary | ICD-10-CM

## 2019-02-26 DIAGNOSIS — Z9889 Other specified postprocedural states: Secondary | ICD-10-CM

## 2019-02-26 DIAGNOSIS — R011 Cardiac murmur, unspecified: Secondary | ICD-10-CM | POA: Diagnosis not present

## 2019-02-26 DIAGNOSIS — Z9289 Personal history of other medical treatment: Secondary | ICD-10-CM

## 2019-02-26 NOTE — Progress Notes (Signed)
Subjective:    Patient ID: Shelly [name] is a 43 y.o. female.  HPI     Interim history:   Shelly [name] is a 43 year old right-handed woman with an underlying medical history of TIA (by chart review), PFO with status post closure in May 2020, migraine headaches, reflux disease, depression, anxiety, arthritis, sarcoidosis, anemia, allergies and obesity, Status post cardioversion on 02/13/2019 for atrial flutter, who presents for follow-up consultation of Shelly [name] obstructive sleep apnea, after recent sleep study testing and starting CPAP therapy.  The patient is unaccompanied today.  I first met Shelly [name] on 10/15/2018 at the request of Shelly [name] primary care physician, at which time Shelly [name] reported snoring, daytime somnolence as well as witnessed apneas.  Shelly [name] was advised to proceed with a sleep study.  Shelly [name] had a baseline sleep study, followed by a CPAP titration study.  I went over Shelly [name] test results with Shelly [name] in detail today.  Shelly [name] baseline sleep study from 11/10/2018 showed a sleep efficiency of 83.4%, sleep latency of 27.5 minutes, REM latency of 96 minutes.  Shelly [name] had a reduced percentage of REM sleep at 8%.  Shelly [name] overall AHI was in the severe range at 38.8/h, O2 nadir was 69%.  Shelly [name] had no significant PLM's.  Shelly [name] was advised to return for a CPAP titration study.  Shelly [name] had this on 12/16/2018.  Sleep efficiency was 88.4%, sleep latency 45 minutes, REM latency 120.5 minutes.  REM percentage was 32.1%, in keeping with rebound.  Shelly [name] was fitted with a small nasal cradle interface and CPAP was titrated from 5 cm to 15 cm.  On a pressure of 13 cm Shelly [name] AHI was 0/h with nonsupine REM sleep achieved an O2 nadir of 93%.  I ordered CPAP therapy for home use, set up date was 01/06/2019.  Today, 02/26/2019: I reviewed Shelly [name] CPAP compliance data from 01/26/2019 through 02/24/2019, which is a total of 30 days, during which time Shelly [name] used Shelly [name] CPAP 27 days with percent use days greater than 4 hours at 83%, indicating very good compliance with an average  usage of 6 hours and 13 minutes, residual AHI borderline at 5.5/h, leak on the higher side with a 95th percentile at 18.9 L/min on a pressure of 13 cm with EPR of 3.  Shelly [name] reports feeling better.  Shelly [name] was hospitalized in late October for shortness of breath and rapid heartbeat.  It explains the lapse in treatment between 10/26 and 02/13/2019.  Shelly [name] actually use the hospital CPAP at the time.  Shelly [name] was diagnosed with atrial flutter and is status post cardioversion on 02/13/2019.  Shelly [name] had pulmonary congestion and was treated with a diuretic while in the hospital.  Shelly [name] feels better.  Shelly [name] also benefits from CPAP therapy and feels better rested.  Shelly [name] is now on Eliquis.  Shelly [name] has a follow-up appointment pending with Shelly [name] cardiologist in [facility] and is also followed by an electrophysiologist.  Shelly [name] is working on smoking cessation.  Shelly [name] has eliminated caffeine.  Shelly [name] is also working on weight loss.  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   The patient's allergies, current medications, family history, past medical history, past social history, past surgical history and problem list were reviewed and updated as appropriate.   Previously:   10/15/2018: (Shelly [name]) reports snoring and excessive daytime somnolence as well as witnessed apneas per husband's report.  I reviewed your office note from 10/09/2018.  Shelly [name] Epworth sleepiness score is 13/24, fatigue severity score is 42/63.  Shelly [name] presented to the hospital in [facility] in  February with TIA-like symptoms, Shelly [name] had right facial numbness, symptoms were preceded by a headache.  Shelly [name] has a history of migraines but typically has visual auras and not always a headache.  Shelly [name] symptoms were different from before and Shelly [name] was diagnosed with a TIA again.  Shelly [name] had PFO closure in May 2020.  Shelly [name] has noticed some tachycardia and palpitations since then but no irregular heartbeat and no shortness of breath.  Shelly [name] has had weight gain over time.  Shelly [name] was in the past on steroids for about 3 years.  Shelly [name]  was on prednisone till 2009.  Shelly [name] is currently on Plavix and half of an adult size aspirin.  Shelly [name] has occasional morning headaches, Shelly [name] reports a family history of sleep apnea affecting Shelly [name] mother and Shelly [name] brother.  Shelly [name] is separated from Shelly [name] husband and lives with Shelly [name] 50-year-old son.  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  Shelly [name] has nocturia about once per average night.  Shelly [name] sleep schedule is more erratic since March 2020 given that Shelly [name] had not been able to go to school to teach.  When Shelly [name] had a more regular schedule before March, Shelly [name] would typically go to bed between 9 and 10 and rise time was around 6.  Shelly [name] has reduced Shelly [name] caffeine intake and Shelly [name] is trying to quit smoking.  Shelly [name] took Chantix through January 2020 but stopped it in the first week of February.  Shelly [name] is supposed to start nicotine patches.  Shelly [name] drinks no alcohol on a regular basis, caffeine currently in the form of soda, one small serving per day on average.  Shelly [name] is followed by neurology in [facility].  Shelly [name] was diagnosed with a PFO in 2011 and was supposed to have sleep study testing done but it never came to fruition.  Shelly [name] Past Medical History Is Significant For: Past Medical History:  Diagnosis Date  . Allergy   . Anemia   . Anxiety   . Arthritis   . Depression   . GERD (gastroesophageal reflux disease)   . Migraines   . Miscarriage   . PFO (patent foramen ovale)   . Sarcoidosis   . Stroke ([place])   . TIA (transient ischemic attack)     Shelly [name] Past Surgical History Is Significant For: Past Surgical History:  Procedure Laterality Date  . BRONCHOSCOPY    . CESAREAN SECTION     miscarriage  . CHOLECYSTECTOMY    . HAND SURGERY    . miscarriage  08/10/14  . NASAL SINUS SURGERY      Shelly [name] Family History Is Significant For: Family History  Problem Relation Age of Onset  . Osteoporosis Mother   . Asthma Mother   . Diabetes Father   . Heart disease Father   . Kidney disease Maternal Grandmother   . Heart disease Maternal Grandfather   .  Cancer Paternal Grandmother   . Heart disease Paternal Grandfather     Shelly [name] Social History Is Significant For: Social History   Socioeconomic History  . Marital status: Married    Spouse name: Not on file  . Number of children: 1  . Years of education: Not on file  . Highest education level: Not on file  Occupational History  . Not on file  Social Needs  . Financial resource strain: Not on file  . Food insecurity    Worry: Not on file    Inability: Not on file  . Transportation needs    Medical: Not on file  Non-medical: Not on file  Tobacco Use  . Smoking status: Current Every Day Smoker    Packs/day: 1.00    Years: 26.00    Pack years: 26.00    Types: Cigarettes  . Smokeless tobacco: Never Used  Substance and Sexual Activity  . Alcohol use: No    Alcohol/week: 0.0 standard drinks  . Drug use: No  . Sexual activity: Yes  Lifestyle  . Physical activity    Days per week: Not on file    Minutes per session: Not on file  . Stress: Not on file  Relationships  . Social Herbalist on phone: Not on file    Gets together: Not on file    Attends religious service: Not on file    Active member of club or organization: Not on file    Attends meetings of clubs or organizations: Not on file    Relationship status: Not on file  Other Topics Concern  . Not on file  Social History Narrative  . Not on file    Shelly [name] Allergies Are:  Allergies  Allergen Reactions  . Gramineae Pollens Hives  . Clindamycin   . Clindamycin/Lincomycin   . Keflex [Cephalexin]   . Lincomycin Hcl   . Penicillins   . Sulfa Antibiotics Hives  . Sulfonamide Derivatives   :   Shelly [name] Current Medications Are:  Outpatient Encounter Medications as of 02/26/2019  Medication Sig  . albuterol (PROVENTIL HFA;VENTOLIN HFA) 108 (90 Base) MCG/ACT inhaler Inhale 1-2 puffs into the lungs every 4 (four) hours as needed.  Marland Kitchen Apixaban (ELIQUIS PO) Take 5 mg by mouth 2 (two) times daily.   Marland Kitchen aspirin  (GOODSENSE ASPIRIN) 325 MG tablet Take 325 mg by mouth.  Marland Kitchen atorvastatin (LIPITOR) 20 MG tablet Take 20 mg by mouth daily.  Marland Kitchen BREO ELLIPTA 200-25 MCG/INH AEPB INHALE 1 PUFF INTO THE LUNGS DAILY  . cetirizine (ZYRTEC) 10 MG tablet Take 10 mg by mouth daily.  . clopidogrel (PLAVIX) 75 MG tablet Take 75 mg by mouth daily.  Marland Kitchen diltiazem (CARDIZEM) 120 MG tablet Take 120 mg by mouth daily.  Marland Kitchen estradiol (ESTRACE) 2 MG tablet Take 2 mg by mouth daily.  . flecainide (TAMBOCOR) 100 MG tablet Take 100 mg by mouth 2 (two) times daily.  . fluticasone (FLONASE) 50 MCG/ACT nasal spray Place 2 sprays into both nostrils daily.  . metoprolol tartrate (LOPRESSOR) 50 MG tablet Take 50 mg by mouth 2 (two) times daily.  . montelukast (SINGULAIR) 10 MG tablet TAKE 1 TABLET BY MOUTH EVERYDAY AT BEDTIME  . nicotine (NICODERM CQ - DOSED IN MG/24 HOURS) 14 mg/24hr patch PLACE 1 PATCH (14 MG TOTAL) ONTO THE SKIN DAILY.  Marland Kitchen prochlorperazine (COMPAZINE) 10 MG tablet Take 1 tablet (10 mg total) by mouth every 8 (eight) hours as needed for nausea or vomiting.  . venlafaxine XR (EFFEXOR-XR) 150 MG 24 hr capsule Take 150 mg by mouth daily with breakfast.  . venlafaxine XR (EFFEXOR-XR) 75 MG 24 hr capsule Take 75 mg by mouth.  Marland Kitchen azithromycin (ZITHROMAX) 250 MG tablet Sig as indicated   No facility-administered encounter medications on file as of 02/26/2019.   :  Review of Systems:  Out of a complete 14 point review of systems, all are reviewed and negative with the exception of these symptoms as listed below: Review of Systems  Neurological:       Pt presents today to discuss Shelly [name] cpap. Pt was in the hospital recently but used  the hospital's cpap during that time. Pt is wondering if the pressure is correct on the cpap; Shelly [name] can't feel pressure when Shelly [name] is using it.    Objective:  Neurological Exam  Physical Exam Physical Examination:   Vitals:   02/26/19 1302  BP: 139/84  Pulse: 80  Temp: (!) 97.2 F (36.2 C)    General Examination: The patient is a very pleasant 43 y.o. female in no acute distress. Shelly [name] appears well-developed and well-nourished and well groomed.   HEENT: Normocephalic, atraumatic, pupils are equal, round and reactive to light, Extraocular tracking is well preserved, hearing is grossly intact, face is symmetric with normal facial animation, speech is clear without dysarthria, hypophonia or voice tremor.  No carotid bruits.  Airway examination reveals mild to moderate dryness, otherwise stable findings. Tongue protrudes centrally and palate elevates symmetrically.    Chest: Clear to auscultation without wheezing, rhonchi or crackles noted.  Heart: S1+S2+0, regular With a slight 1/6 systolic murmur noted.    Abdomen: Soft, non-tender and non-distended with normal bowel sounds appreciated on auscultation.  Extremities: There is no pitting edema in the distal lower extremities bilaterally.  Skin: Warm and dry without trophic changes noted.  Musculoskeletal: exam reveals no obvious joint deformities, tenderness or joint swelling or erythema.   Neurologically:  Mental status: The patient is awake, alert and oriented in all 4 spheres. Shelly [name] immediate and remote memory, attention, language skills and fund of knowledge are appropriate. There is no evidence of aphasia, agnosia, apraxia or anomia. Speech is clear with normal prosody and enunciation. Thought process is linear. Mood is normal and affect is normal.  Cranial nerves II - XII are as described above under HEENT exam.  Motor exam: Normal bulk, strength and tone is noted. There is no tremor. Fine motor skills and coordination: grossly intact.  Cerebellar testing: No dysmetria or intention tremor. There is no truncal or gait ataxia.  Sensory exam: intact to light touch in the upper and lower extremities.  Gait, station and balance: Shelly [name] stands easily. No veering to one side is noted. No leaning to one side is noted. Posture is  age-appropriate and stance is narrow based. Gait shows normal stride length and normal pace. No problems turning are noted.  Assessment and Plan:   In summary, SVARA TWYMAN is a very pleasant 43 year old female with an underlying medical history of TIA (by chart review), PFO with status post closure in May 2020, migraine headaches, reflux disease, depression, anxiety, arthritis, sarcoidosis, anemia, allergies and obesity, Status post cardioversion on 02/13/2019 for atrial flutter, who presents for follow-up consultation of Shelly [name] obstructive sleep apnea, after recent sleep study testing and starting CPAP therapy. Shelly [name] baseline sleep study from 11/10/2018 indicated severe sleep apnea.  Shelly [name] had good results with CPAP therapy during Shelly [name] CPAP titration study on 11/10/2018.  Shelly [name] is fully compliant with CPAP.  Shelly [name] used it during Shelly [name] hospitalization in late October with a hospital provided machine as well.  Shelly [name] feels improved.  Shelly [name] is recuperating.  Shelly [name] is in close follow-up with Shelly [name] cardiologist. I noticed a slight systolic murmur, Shelly [name] is advised to bring this up with Shelly [name] cardiologist.  Shelly [name] has no new symptoms.  Shelly [name] has noticed improvement in Shelly [name] sleep quality and daytime symptoms after starting CPAP therapy.  Shelly [name] is commended for Shelly [name] treatment adherence.  Shelly [name] is working on smoking cessation and weight loss.  Shelly [name] is advised to continue to work on Shelly [name] lifestyle modification.  Advised to try CPAP at  14 cm to see if we can optimize Shelly [name] AHI even further.  Shelly [name] has a borderline AHI of 5.5/h currently on a pressure of 13 cm.  Shelly [name] is advised to call our office in about 6 weeks so we can review Shelly [name] compliance data on the new pressure.  Shelly [name] is using a nasal cradle interface.Shelly [name] is going to receive new supplies but has not changed Shelly [name] filter yet.  I provided Shelly [name] with a new filter today, as hers did look "dingy". Shelly [name] is advised to routinely follow-up with one of our nurse practitioners in 6 months, hopefully we can continue to see  Shelly [name] yearly after that.  I answered all Shelly [name] questions today and Shelly [name] was in agreement.  I spent 25 minutes in total face-to-face time with the patient, more than 50% of which was spent in counseling and coordination of care, reviewing test results, reviewing medication and discussing or reviewing the diagnosis of OSA, its prognosis and treatment options. Pertinent laboratory and imaging test results that were available during this visit with the patient were reviewed by me and considered in my medical decision making (see chart for details).

## 2019-02-26 NOTE — Progress Notes (Signed)
Order for increase in cpap pressure sent to [facility] via community message. Confirmation received that the order transmitted was successful.

## 2019-02-26 NOTE — Patient Instructions (Addendum)
Please continue using your CPAP regularly. While your insurance requires that you use CPAP at least 4 hours each night on 70% of the nights, I recommend, that you not skip any nights and use it throughout the night if you can. Getting used to CPAP and staying with the treatment long term does take time and patience and discipline. Untreated obstructive sleep apnea when it is moderate to severe can have an adverse impact on cardiovascular health and raise her risk for heart disease, arrhythmias, hypertension, congestive heart failure, stroke and diabetes. Untreated obstructive sleep apnea causes sleep disruption, nonrestorative sleep, and sleep deprivation. This can have an impact on your day to day functioning and cause daytime sleepiness and impairment of cognitive function, memory loss, mood disturbance, and problems focussing. Using CPAP regularly can improve these symptoms.  Keep up the good work! As discussed, we will increase your CPAP pressure by 1 notch to 14 cm at this time.  Please continue to be fully compliant with it, you have done a great job.  Please check in with [place] by phone or email [place] through [employer] in the next 4 to 6 weeks so we can download another compliance report and look at the data, we will call you as to how things are looking on the new pressure.  Please follow-up in 6 months to see the nurse practitioner, hopefully we can follow you yearly thereafter. Please continue to work on smoking cessation and weight loss.

## 2019-03-29 IMAGING — DX DG CHEST 2V
2 series · 2 of 2 positions shown · non-contrast
Comparison: 07/16/2017, 03/23/2016

CLINICAL DATA: Shortness of Breath

EXAM:
CHEST - 2 VIEW

[chest pa]
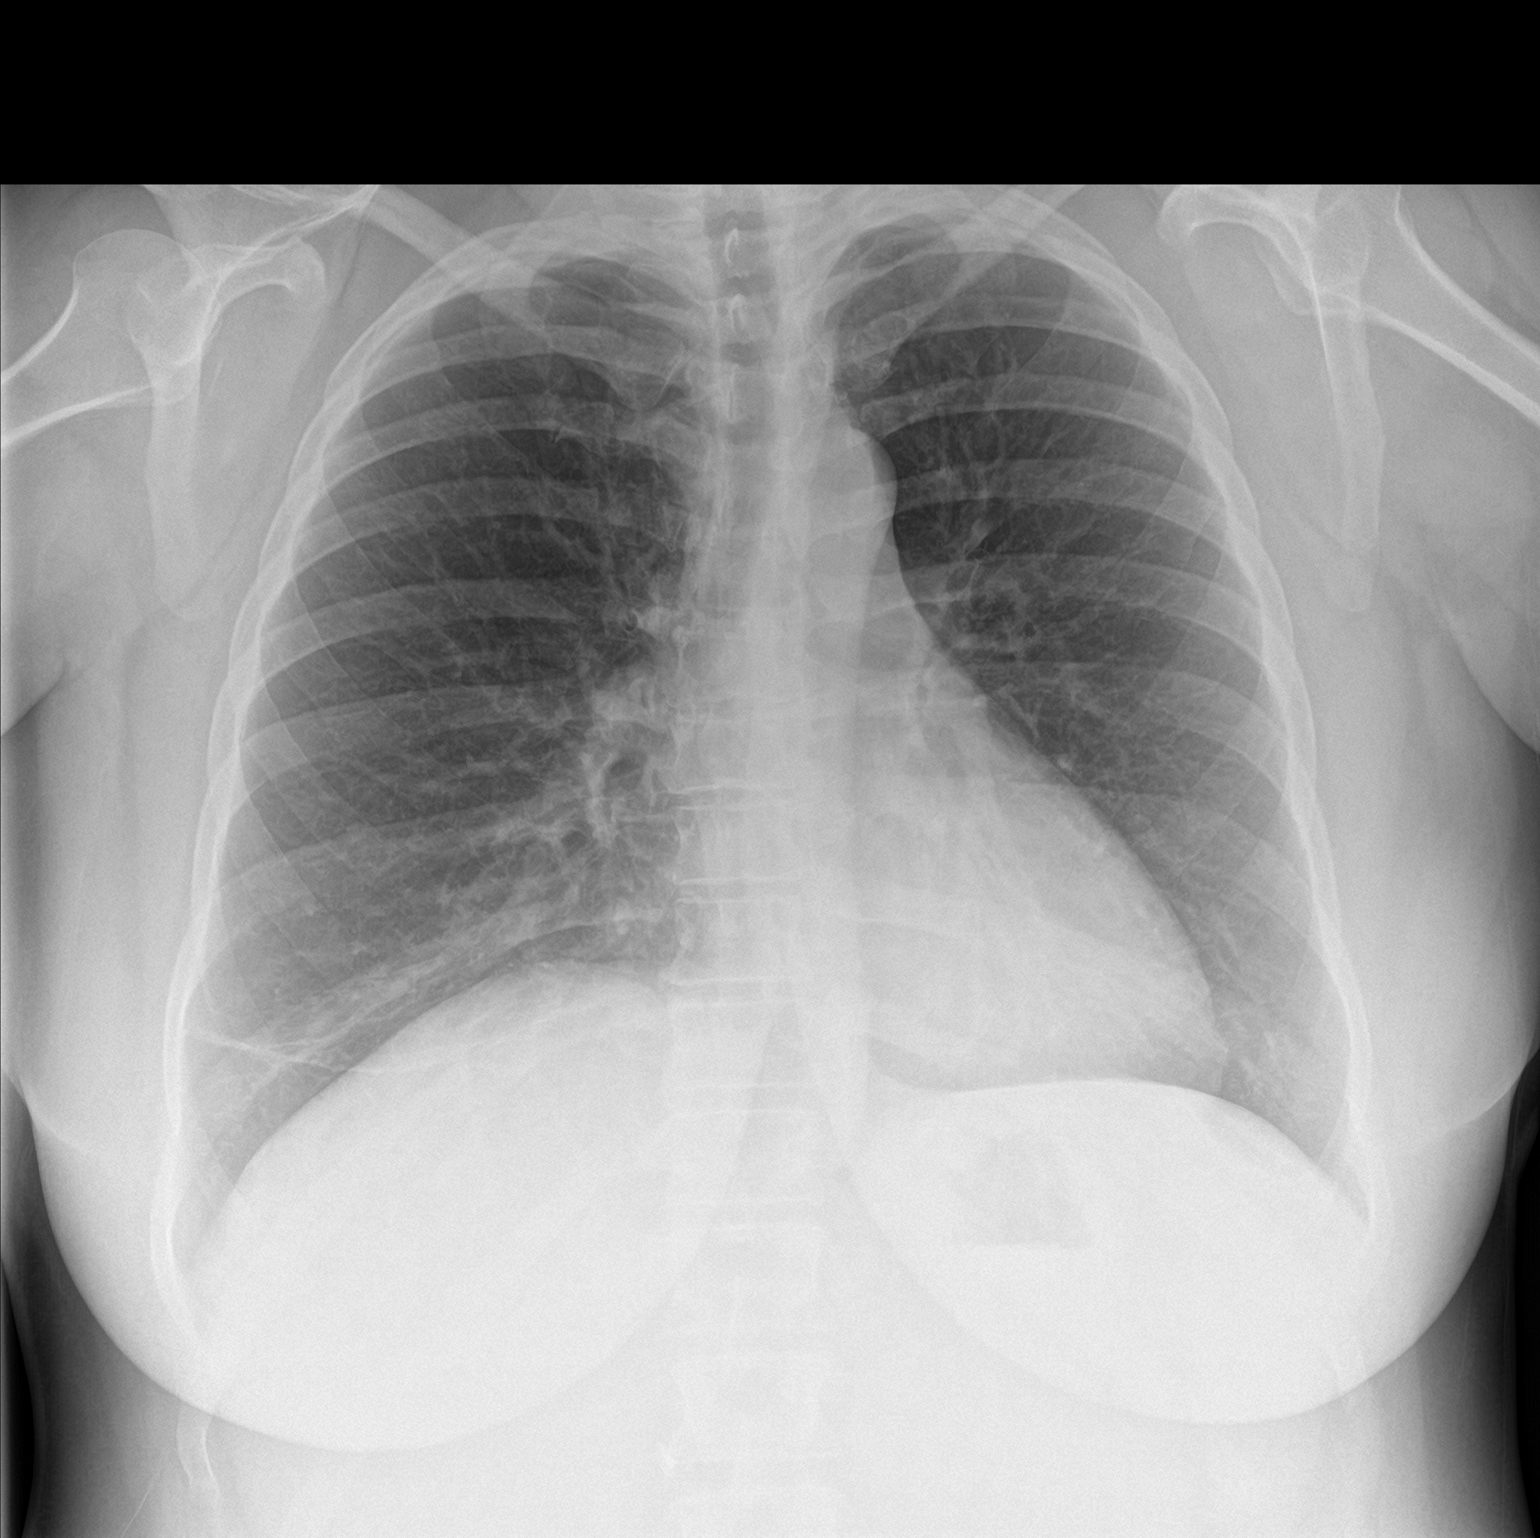

[chest lat]
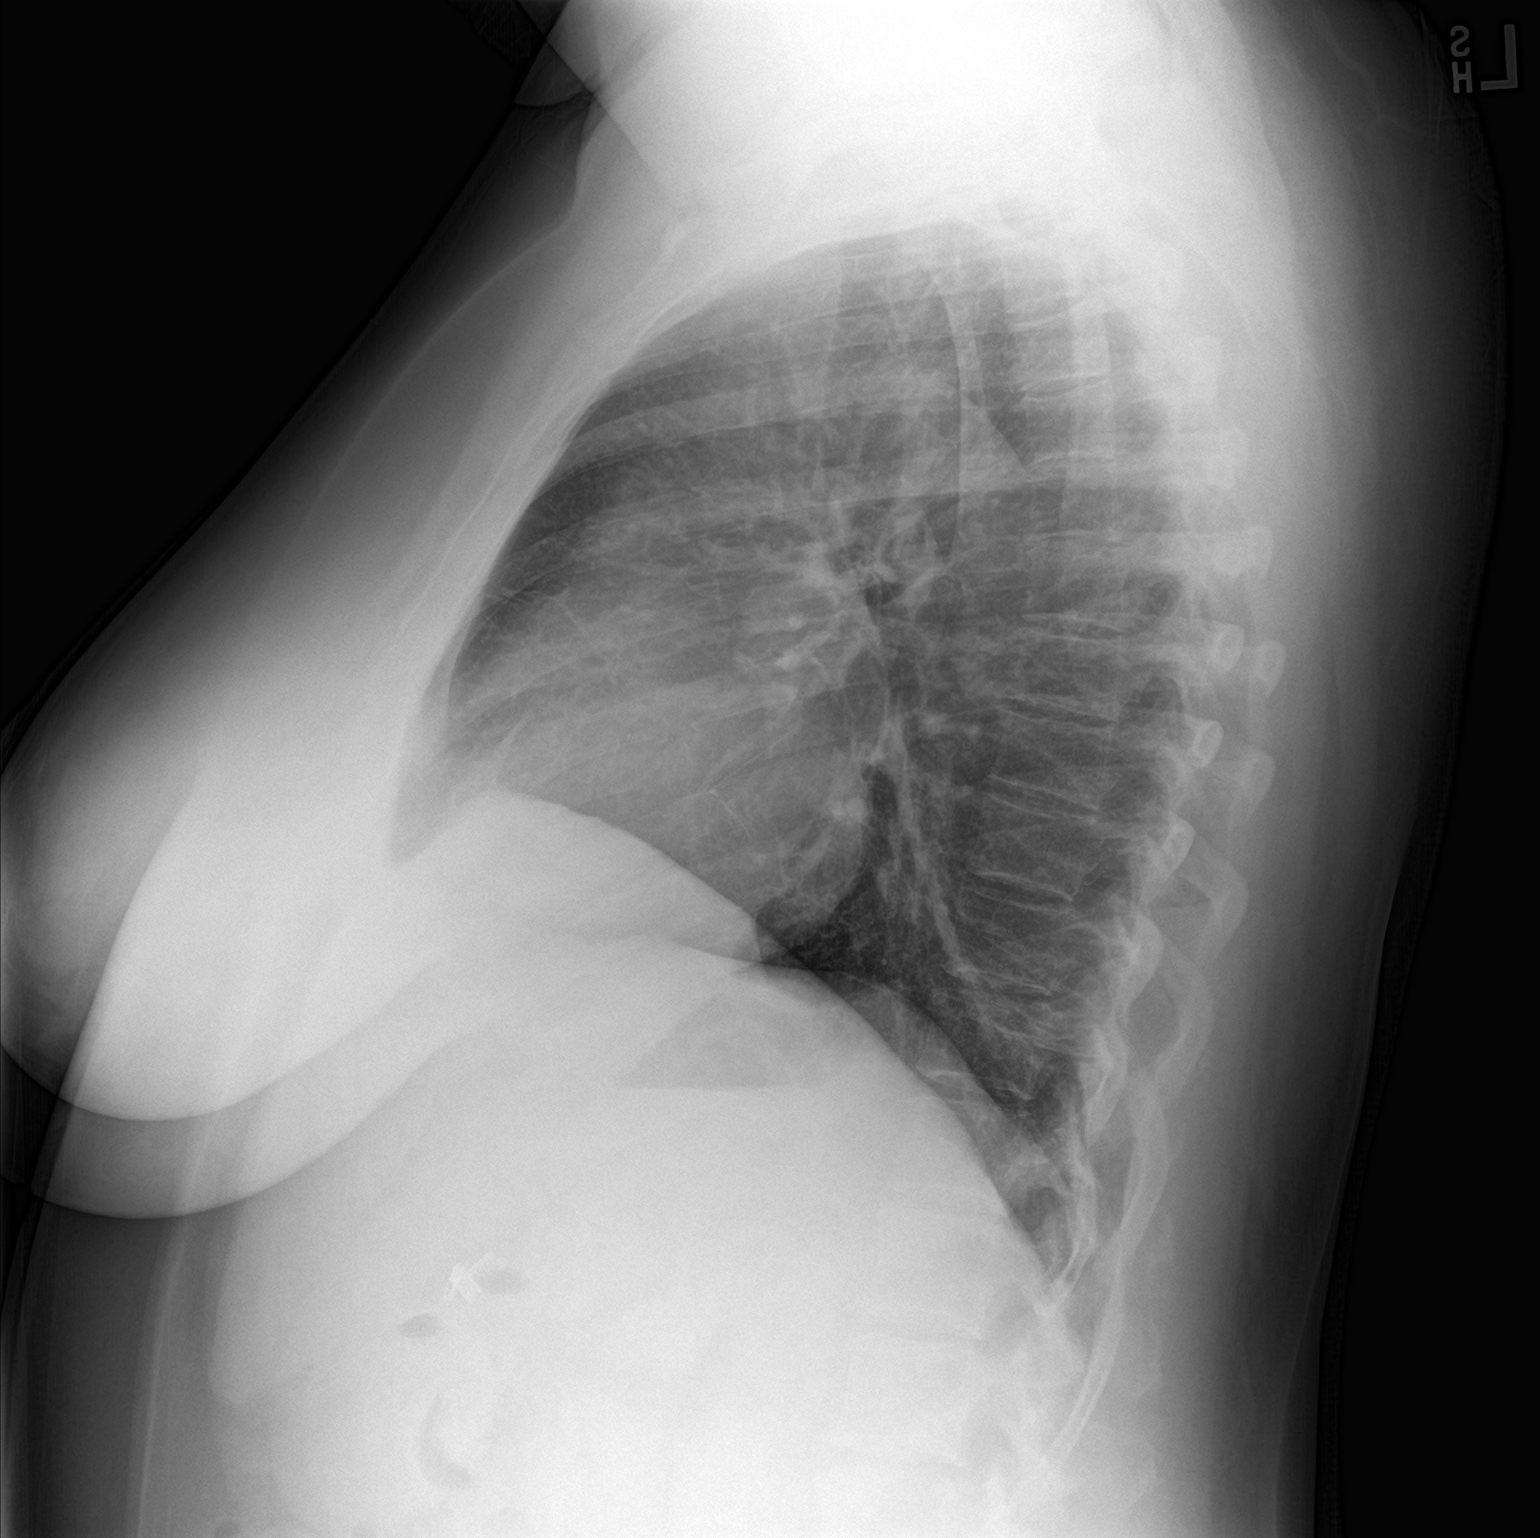

[2 of 2 positions shown; findings below may reference images not displayed]

FINDINGS: Cardiac shadow is stable. Cardiac shadow is within normal limits.
The lungs are well aerated bilaterally. Scarring is again seen in
the right lung base stable from multiple previous exams change back
to 03/23/2016 filtrate or effusion is noted. No bony abnormality is
seen.
IMPRESSION: No active cardiopulmonary disease.

## 2019-04-08 ENCOUNTER — Other Ambulatory Visit: Payer: Self-pay | Admitting: Emergency Medicine

## 2019-07-04 ENCOUNTER — Other Ambulatory Visit: Payer: Self-pay | Admitting: Emergency Medicine

## 2019-07-10 ENCOUNTER — Telehealth (INDEPENDENT_AMBULATORY_CARE_PROVIDER_SITE_OTHER): Payer: BC Managed Care – PPO | Admitting: Emergency Medicine

## 2019-07-10 ENCOUNTER — Other Ambulatory Visit: Payer: Self-pay

## 2019-07-10 ENCOUNTER — Encounter: Payer: Self-pay | Admitting: Emergency Medicine

## 2019-07-10 VITALS — Ht 64.5 in | Wt 210.0 lb

## 2019-07-10 DIAGNOSIS — R05 Cough: Secondary | ICD-10-CM | POA: Diagnosis not present

## 2019-07-10 DIAGNOSIS — J22 Unspecified acute lower respiratory infection: Secondary | ICD-10-CM | POA: Diagnosis not present

## 2019-07-10 DIAGNOSIS — R059 Cough, unspecified: Secondary | ICD-10-CM

## 2019-07-10 MED ORDER — AZITHROMYCIN 250 MG PO TABS: ORAL_TABLET | ORAL | 0 refills | Status: DC

## 2019-07-10 NOTE — Progress Notes (Signed)
Telemedicine Encounter- SOAP NOTE Established Patient MyChart video conference This video telephone encounter was conducted with the patient's (or proxy's) verbal consent via video audio telecommunications: yes/no: Yes Patient was instructed to have this encounter in a suitably private space; and to only have persons present to whom they give permission to participate. In addition, patient identity was confirmed by use of name plus two identifiers (DOB and address).  I discussed the limitations, risks, security and privacy concerns of performing an evaluation and management service by telephone and the availability of in person appointments. I also discussed with the patient that there may be a patient responsible charge related to this service. The patient expressed understanding and agreed to proceed.  I spent a total of TIME; 0 MIN TO 60 MIN: 15 minutes talking with the patient or their proxy.  Chief Complaint  Patient presents with  . Cough  . Fever    99- 101.0 degrees with fatigue    Subjective   Shelly [name] is a 44 y.o. female established patient. Telephone visit today complaining of flulike symptoms that started 2 days ago with tiredness followed by fever yesterday and cough.  Took second Covid vaccine shot last Friday and had no symptoms for the next 2 to 3 days.  No other significant symptoms.  Denies difficulty breathing.  Pulse ox at home 98%.  HPI   Patient Active Problem List   Diagnosis Date Noted  . History of asthma 01/01/2018  . Chronic seasonal allergic rhinitis 01/01/2018  . Depression 09/07/2016  . 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 Smoking addiction 06/05/2014  . SARCOIDOSIS 04/05/2007    Past Medical History:  Diagnosis Date  . Allergy   . Anemia   . Anxiety   . Arthritis   . Depression   . GERD (gastroesophageal reflux disease)   . Migraines   . Miscarriage   .  PFO (patent foramen ovale)   . Sarcoidosis   . Stroke (HCC)   . TIA (transient ischemic attack)     Current Outpatient Medications  Medication Sig Dispense Refill  . albuterol (PROVENTIL HFA;VENTOLIN HFA) 108 (90 Base) MCG/ACT inhaler Inhale 1-2 puffs into the lungs every 4 (four) hours as needed. 6.7 g 0  . Apixaban (ELIQUIS PO) Take 5 mg by mouth 2 (two) times daily.     Marland Kitchen aspirin (GOODSENSE ASPIRIN) 325 MG tablet Take 325 mg by mouth.    Marland Kitchen atorvastatin (LIPITOR) 20 MG tablet Take 20 mg by mouth daily.    Marland Kitchen BREO ELLIPTA 200-25 MCG/INH AEPB INHALE 1 PUFF INTO THE LUNGS DAILY 60 each 11  . cetirizine (ZYRTEC) 10 MG tablet Take 10 mg by mouth daily.    Marland Kitchen diltiazem (CARDIZEM) 120 MG tablet Take 120 mg by mouth daily.    Marland Kitchen estradiol (ESTRACE) 2 MG tablet Take 2 mg by mouth daily.    . flecainide (TAMBOCOR) 100 MG tablet Take 100 mg by mouth 2 (two) times daily.    . fluticasone (FLONASE) 50 MCG/ACT nasal spray Place 2 sprays into both nostrils daily. 16 g 12  . metoprolol tartrate (LOPRESSOR) 50 MG tablet Take 50 mg by mouth 2 (two) times daily.    . montelukast (SINGULAIR) 10 MG tablet TAKE 1 TABLET BY MOUTH EVERYDAY AT BEDTIME 90 tablet 2  . venlafaxine XR (EFFEXOR-XR) 150 MG 24 hr capsule Take 150 mg by mouth daily with breakfast.    . venlafaxine  XR (EFFEXOR-XR) 75 MG 24 hr capsule Take 75 mg by mouth.    . clopidogrel (PLAVIX) 75 MG tablet Take 75 mg by mouth daily.    . nicotine (NICODERM CQ - DOSED IN MG/24 HOURS) 14 mg/24hr patch PLACE 1 PATCH (14 MG TOTAL) ONTO THE SKIN DAILY. 28 patch 3  . prochlorperazine (COMPAZINE) 10 MG tablet Take 1 tablet (10 mg total) by mouth every 8 (eight) hours as needed for nausea or vomiting. (Patient not taking: Reported on 07/10/2019) 20 tablet 0   No current facility-administered medications for this visit.    Allergies  Allergen Reactions  . Gramineae Pollens Hives  . Clindamycin   . Clindamycin/Lincomycin   . Keflex [Cephalexin]   .  Lincomycin Hcl   . Penicillins   . Sulfa Antibiotics Hives  . Sulfonamide Derivatives     Social History   Socioeconomic History  . Marital status: Married    Spouse name: Not on file  . Number of children: 1  . Years of education: Not on file  . Highest education level: Not on file  Occupational History  . Not on file  Tobacco Use  . Smoking status: Current Every Day Smoker    Packs/day: 1.00    Years: 26.00    Pack years: 26.00    Types: Cigarettes  . Smokeless tobacco: Never Used  Substance and Sexual Activity  . Alcohol use: No    Alcohol/week: 0.0 standard drinks  . Drug use: No  . Sexual activity: Yes  Other Topics Concern  . Not on file  Social History Narrative  . Not on file   Social Determinants of Health   Financial Resource Strain:   . Difficulty of Paying Living Expenses:   Food Insecurity:   . Worried About Charity fundraiser in the Last Year:   . Arboriculturist in the Last Year:   Transportation Needs:   . Film/video editor (Medical):   Marland Kitchen Lack of Transportation (Non-Medical):   Physical Activity:   . Days of Exercise per Week:   . Minutes of Exercise per Session:   Stress:   . Feeling of Stress :   Social Connections:   . Frequency of Communication with Friends and Family:   . Frequency of Social Gatherings with Friends and Family:   . Attends Religious Services:   . Active Member of Clubs or Organizations:   . Attends Archivist Meetings:   Marland Kitchen Marital Status:   Intimate Partner Violence:   . Fear of Current or Ex-Partner:   . Emotionally Abused:   Marland Kitchen Physically Abused:   . Sexually Abused:     Review of Systems  Constitutional: Positive for fever and malaise/fatigue.  HENT: Positive for congestion. Negative for sore throat.   Respiratory: Positive for cough. Negative for sputum production, shortness of breath and wheezing.   Cardiovascular: Negative.  Negative for chest pain and palpitations.  Gastrointestinal:  Negative.  Negative for abdominal pain, blood in stool, diarrhea, melena, nausea and vomiting.  Genitourinary: Negative.  Negative for dysuria and hematuria.  Musculoskeletal: Negative.  Negative for back pain, myalgias and neck pain.  Skin: Negative.   Neurological: Negative.  Negative for dizziness and headaches.  All other systems reviewed and are negative.   Objective  Alert and oriented x3 in no apparent respiratory distress Vitals as reported by the patient: Today's Vitals   07/10/19 1447  Weight: 210 lb (95.3 kg)  Height: 5' 4.5" (1.638 m)  There are no diagnoses linked to this encounter. Shron was seen today for cough and fever.  Diagnoses and all orders for this visit:  Cough  Lower respiratory infection -     azithromycin (ZITHROMAX) 250 MG tablet; Sig as indicated   Possible Covid infection.  Advised to get tested.  No red flag signs or symptoms.  No complications.  Tolerating it well. Advised to contact the office if no better or worse in the next several days.  I discussed the assessment and treatment plan with the patient. The patient was provided an opportunity to ask questions and all were answered. The patient agreed with the plan and demonstrated an understanding of the instructions.   The patient was advised to call back or seek an in-person evaluation if the symptoms worsen or if the condition fails to improve as anticipated.  I provided 15 minutes of non-face-to-face time during this encounter.  Georgina Quint, MD  Primary Care at [facility]

## 2019-07-10 NOTE — Patient Instructions (Signed)
° ° ° °  If you have lab work done today you will be contacted with your lab results within the next 2 weeks.  If you have not heard from us then please contact us. The fastest way to get your results is to register for My Chart. ° ° °IF you received an x-ray today, you will receive an invoice from [facility]. Please contact [facility] at [phone] with questions or concerns regarding your invoice.  ° °IF you received labwork today, you will receive an invoice from [facility]. Please contact [facility] at [phone] with questions or concerns regarding your invoice.  ° °Our billing staff will not be able to assist you with questions regarding bills from these companies. ° °You will be contacted with the lab results as soon as they are available. The fastest way to get your results is to activate your My Chart account. Instructions are located on the last page of this paperwork. If you have not heard from us regarding the results in 2 weeks, please contact this office. °  ° ° ° °

## 2019-08-26 ENCOUNTER — Other Ambulatory Visit: Payer: Self-pay

## 2019-08-26 ENCOUNTER — Ambulatory Visit: Payer: BC Managed Care – PPO | Admitting: Family Medicine

## 2019-08-26 ENCOUNTER — Encounter: Payer: Self-pay | Admitting: Family Medicine

## 2019-08-26 VITALS — BP 139/95 | HR 89 | Temp 98.4°F | Ht 64.0 in | Wt 210.0 lb

## 2019-08-26 DIAGNOSIS — Z8679 Personal history of other diseases of the circulatory system: Secondary | ICD-10-CM

## 2019-08-26 DIAGNOSIS — G4733 Obstructive sleep apnea (adult) (pediatric): Secondary | ICD-10-CM

## 2019-08-26 DIAGNOSIS — Z9989 Dependence on other enabling machines and devices: Secondary | ICD-10-CM | POA: Diagnosis not present

## 2019-08-26 DIAGNOSIS — Z9889 Other specified postprocedural states: Secondary | ICD-10-CM

## 2019-08-26 NOTE — Patient Instructions (Signed)
Please continue using your CPAP regularly. While your insurance requires that you use CPAP at least 4 hours each night on 70% of the nights, I recommend, that you not skip any nights and use it throughout the night if you can. Getting used to CPAP and staying with the treatment long term does take time and patience and discipline. Untreated obstructive sleep apnea when it is moderate to severe can have an adverse impact on cardiovascular health and raise her risk for heart disease, arrhythmias, hypertension, congestive heart failure, stroke and diabetes. Untreated obstructive sleep apnea causes sleep disruption, nonrestorative sleep, and sleep deprivation. This can have an impact on your day to day functioning and cause daytime sleepiness and impairment of cognitive function, memory loss, mood disturbance, and problems focussing. Using CPAP regularly can improve these symptoms.  Adjust humidity setting to help with dry mouth. I also advise trying Biotene products to help with dry mouth. Stay well hydrated.   Follow up in 6 months   Sleep Apnea Sleep apnea affects breathing during sleep. It causes breathing to stop for a short time or to become shallow. It can also increase the risk of:  Heart attack.  Stroke.  Being very overweight (obese).  Diabetes.  Heart failure.  Irregular heartbeat. The goal of treatment is to help you breathe normally again. What are the causes? There are three kinds of sleep apnea:  Obstructive sleep apnea. This is caused by a blocked or collapsed airway.  Central sleep apnea. This happens when the brain does not send the right signals to the muscles that control breathing.  Mixed sleep apnea. This is a combination of obstructive and central sleep apnea. The most common cause of this condition is a collapsed or blocked airway. This can happen if:  Your throat muscles are too relaxed.  Your tongue and tonsils are too large.  You are overweight.  Your  airway is too small. What increases the risk?  Being overweight.  Smoking.  Having a small airway.  Being older.  Being female.  Drinking alcohol.  Taking medicines to calm yourself (sedatives or tranquilizers).  Having family members with the condition. What are the signs or symptoms?  Trouble staying asleep.  Being sleepy or tired during the day.  Getting angry a lot.  Loud snoring.  Headaches in the morning.  Not being able to focus your mind (concentrate).  Forgetting things.  Less interest in sex.  Mood swings.  Personality changes.  Feelings of sadness (depression).  Waking up a lot during the night to pee (urinate).  Dry mouth.  Sore throat. How is this diagnosed?  Your medical history.  A physical exam.  A test that is done when you are sleeping (sleep study). The test is most often done in a sleep lab but may also be done at home. How is this treated?   Sleeping on your side.  Using a medicine to get rid of mucus in your nose (decongestant).  Avoiding the use of alcohol, medicines to help you relax, or certain pain medicines (narcotics).  Losing weight, if needed.  Changing your diet.  Not smoking.  Using a machine to open your airway while you sleep, such as: ? An oral appliance. This is a mouthpiece that shifts your lower jaw forward. ? A CPAP device. This device blows air through a mask when you breathe out (exhale). ? An EPAP device. This has valves that you put in each nostril. ? A BPAP device. This device blows  air through a mask when you breathe in (inhale) and breathe out.  Having surgery if other treatments do not work. It is important to get treatment for sleep apnea. Without treatment, it can lead to:  High blood pressure.  Coronary artery disease.  In men, not being able to have an erection (impotence).  Reduced thinking ability. Follow these instructions at home: Lifestyle  Make changes that your doctor  recommends.  Eat a healthy diet.  Lose weight if needed.  Avoid alcohol, medicines to help you relax, and some pain medicines.  Do not use any products that contain nicotine or tobacco, such as cigarettes, e-cigarettes, and chewing tobacco. If you need help quitting, ask your doctor. General instructions  Take over-the-counter and prescription medicines only as told by your doctor.  If you were given a machine to use while you sleep, use it only as told by your doctor.  If you are having surgery, make sure to tell your doctor you have sleep apnea. You may need to bring your device with you.  Keep all follow-up visits as told by your doctor. This is important. Contact a doctor if:  The machine that you were given to use during sleep bothers you or does not seem to be working.  You do not get better.  You get worse. Get help right away if:  Your chest hurts.  You have trouble breathing in enough air.  You have an uncomfortable feeling in your back, arms, or stomach.  You have trouble talking.  One side of your body feels weak.  A part of your face is hanging down. These symptoms may be an emergency. Do not wait to see if the symptoms will go away. Get medical help right away. Call your local emergency services (911 in the [place] Do not drive yourself to the hospital. Summary  This condition affects breathing during sleep.  The most common cause is a collapsed or blocked airway.  The goal of treatment is to help you breathe normally while you sleep. This information is not intended to replace advice given to you by your health care provider. Make sure you discuss any questions you have with your health care provider. Document Revised: 01/18/2018 Document Reviewed: 11/27/2017 Elsevier Patient Education  2020 [employer].

## 2019-08-26 NOTE — Progress Notes (Addendum)
PATIENT: Shelly [name] DOB: [birth year]  REASON FOR VISIT: follow up HISTORY FROM: patient  Chief Complaint  Patient presents with  . Follow-up    cpap fu, alone, rm 2, pt states she is waking up with dry mouth     HISTORY OF PRESENT ILLNESS: Today 08/26/19 Shelly [name] is a 44 y.o. female here today for follow up for OSA on CPAP therapy.  She admits that she continues to struggle with compliance.  She was hospitalized in March for atrial flutter.  She continues to follow closely but feels that anxiety related to her cardiac condition has prevented her from using CPAP consistently.  She is also having more difficulty with dry mouth with recent CPAP use.  She does note improvement in sleep quality when she was using CPAP more consistently.  She does wish to resume therapy.   Compliance report dated 07/26/2019 through 08/24/2019 reveals that she used CPAP 18 of the past 30 days for compliance of 60%.  She used CPAP 14 days greater than 4 hours for compliance of 47%.  Average usage was 4 hours and 46 minutes.  Residual AHI was 3.9 on a set pressure of 14 cm of water and an EPR of 3.  Leak in the 95th percentile of 15.7.  HISTORY: (copied from Dr Guadelupe Sabin note on 02/26/2019)  Shelly [name] is a 44 year old right-handed woman with an underlying medical history of TIA (by chart review), PFO with status post closure in May 2020, migraine headaches, reflux disease, depression, anxiety, arthritis, sarcoidosis, anemia, allergies and obesity, Status post cardioversion on 02/13/2019 for atrial flutter, who presents for follow-up consultation of her obstructive sleep apnea, after recent sleep study testing and starting CPAP therapy.  The patient is unaccompanied today.  I first met her on 10/15/2018 at the request of her primary care physician, at which time she reported snoring, daytime somnolence as well as witnessed apneas.  She was advised to proceed with a sleep study.  She had a baseline sleep study,  followed by a CPAP titration study.  I went over her test results with her in detail today.  Her baseline sleep study from 11/10/2018 showed a sleep efficiency of 83.4%, sleep latency of 27.5 minutes, REM latency of 96 minutes.  She had a reduced percentage of REM sleep at 8%.  Her overall AHI was in the severe range at 38.8/h, O2 nadir was 69%.  She had no significant PLM's.  She was advised to return for a CPAP titration study.  She had this on 12/16/2018.  Sleep efficiency was 88.4%, sleep latency 45 minutes, REM latency 120.5 minutes.  REM percentage was 32.1%, in keeping with rebound.  She was fitted with a small nasal cradle interface and CPAP was titrated from 5 cm to 15 cm.  On a pressure of 13 cm her AHI was 0/h with nonsupine REM sleep achieved an O2 nadir of 93%.  I ordered CPAP therapy for home use, set up date was 01/06/2019.  Today, 02/26/2019: I reviewed her CPAP compliance data from 01/26/2019 through 02/24/2019, which is a total of 30 days, during which time she used her CPAP 27 days with percent use days greater than 4 hours at 83%, indicating very good compliance with an average usage of 6 hours and 13 minutes, residual AHI borderline at 5.5/h, leak on the higher side with a 95th percentile at 18.9 L/min on a pressure of 13 cm with EPR of 3.  She reports feeling better.  She was hospitalized in late October for shortness of breath and rapid heartbeat.  It explains the lapse in treatment between 10/26 and 02/13/2019.  She actually use the hospital CPAP at the time.  She was diagnosed with atrial flutter and is status post cardioversion on 02/13/2019.  She had pulmonary congestion and was treated with a diuretic while in the hospital.  She feels better.  She also benefits from CPAP therapy and feels better rested.  She is now on Eliquis.  She has a follow-up appointment pending with her cardiologist in [facility] and is also followed by an electrophysiologist.  She is working on smoking cessation.   She has eliminated caffeine.  She is also working on weight loss.  She is teaching remotely currently.   The patient's allergies, current medications, family history, past medical history, past social history, past surgical history and problem list were reviewed and updated as appropriate.   Previously:   10/15/2018: (She) reports snoring and excessive daytime somnolence as well as witnessed apneas per husband's report. I reviewed your office note from 10/09/2018.  Her Epworth sleepiness score is 13/24, fatigue severity score is 42/63. She presented to the hospital in [facility] in February with TIA-like symptoms, she had right facial numbness, symptoms were preceded by a headache. She has a history of migraines but typically has visual auras and not always a headache. Her symptoms were different from before and she was diagnosed with a TIA again. She had PFO closure in May 2020. She has noticed some tachycardia and palpitations since then but no irregular heartbeat and no shortness of breath. She has had weight gain over time. She was in the past on steroids for about 3 years. She was on prednisone till 2009. She is currently on Plavix and half of an adult size aspirin. She has occasional morning headaches, she reports a family history of sleep apnea affecting her mother and her brother. She is separated from her husband and lives with her 7-year-old son. She is a Patent examiner. She has nocturia about once per average night. Her sleep schedule is more erratic since March 2020 given that she had not been able to go to school to teach. When she had a more regular schedule before March, she would typically go to bed between 9 and 10 and rise time was around 6. She has reduced her caffeine intake and she is trying to quit smoking. She took Chantix through January 2020 but stopped it in the first week of February. She is supposed to start nicotine patches. She drinks no alcohol on a  regular basis, caffeine currently in the form of soda, one small serving per day on average. She is followed by neurology in [facility]. She was diagnosed with a PFO in 2011 and was supposed to have sleep study testing done but it never came to fruition.   REVIEW OF SYSTEMS: Out of a complete 14 system review of symptoms, the patient complains only of the following symptoms, fatigue, anxiety and all other reviewed systems are negative.  ESS: 13 FSS: 38  ALLERGIES: Allergies  Allergen Reactions  . Gramineae Pollens Hives  . Clindamycin   . Clindamycin/Lincomycin   . Keflex [Cephalexin]   . Lincomycin Hcl   . Penicillins   . Sulfa Antibiotics Hives  . Sulfonamide Derivatives     HOME MEDICATIONS: Outpatient Medications Prior to Visit  Medication Sig Dispense Refill  . albuterol (PROVENTIL HFA;VENTOLIN HFA) 108 (90 Base) MCG/ACT inhaler Inhale  1-2 puffs into the lungs every 4 (four) hours as needed. 6.7 g 0  . Apixaban (ELIQUIS PO) Take 5 mg by mouth 2 (two) times daily.     Marland Kitchen aspirin (GOODSENSE ASPIRIN) 325 MG tablet Take 325 mg by mouth.    Marland Kitchen atorvastatin (LIPITOR) 20 MG tablet Take 20 mg by mouth daily.    Marland Kitchen BREO ELLIPTA 200-25 MCG/INH AEPB INHALE 1 PUFF INTO THE LUNGS DAILY 60 each 11  . cetirizine (ZYRTEC) 10 MG tablet Take 10 mg by mouth daily.    Marland Kitchen diltiazem (CARDIZEM) 120 MG tablet Take 120 mg by mouth daily.    Marland Kitchen estradiol (ESTRACE) 2 MG tablet Take 2 mg by mouth daily.    . flecainide (TAMBOCOR) 100 MG tablet Take 100 mg by mouth 2 (two) times daily.    . fluticasone (FLONASE) 50 MCG/ACT nasal spray Place 2 sprays into both nostrils daily. 16 g 12  . metoprolol tartrate (LOPRESSOR) 50 MG tablet Take 50 mg by mouth 2 (two) times daily.    . montelukast (SINGULAIR) 10 MG tablet TAKE 1 TABLET BY MOUTH EVERYDAY AT BEDTIME 90 tablet 2  . nicotine (NICODERM CQ - DOSED IN MG/24 HOURS) 14 mg/24hr patch PLACE 1 PATCH (14 MG TOTAL) ONTO THE SKIN DAILY. 28 patch 3  .  prochlorperazine (COMPAZINE) 10 MG tablet Take 1 tablet (10 mg total) by mouth every 8 (eight) hours as needed for nausea or vomiting. 20 tablet 0  . venlafaxine XR (EFFEXOR-XR) 150 MG 24 hr capsule Take 150 mg by mouth daily with breakfast.    . venlafaxine XR (EFFEXOR-XR) 75 MG 24 hr capsule Take 75 mg by mouth.    Marland Kitchen azithromycin (ZITHROMAX) 250 MG tablet Sig as indicated 6 tablet 0  . clopidogrel (PLAVIX) 75 MG tablet Take 75 mg by mouth daily.     No facility-administered medications prior to visit.    PAST MEDICAL HISTORY: Past Medical History:  Diagnosis Date  . Allergy   . Anemia   . Anxiety   . Arthritis   . Depression   . GERD (gastroesophageal reflux disease)   . Migraines   . Miscarriage   . PFO (patent foramen ovale)   . Sarcoidosis   . Stroke ([place])   . TIA (transient ischemic attack)     PAST SURGICAL HISTORY: Past Surgical History:  Procedure Laterality Date  . BRONCHOSCOPY    . CESAREAN SECTION     miscarriage  . CHOLECYSTECTOMY    . HAND SURGERY    . miscarriage  08/10/14  . NASAL SINUS SURGERY      FAMILY HISTORY: Family History  Problem Relation Age of Onset  . Osteoporosis Mother   . Asthma Mother   . Diabetes Father   . Heart disease Father   . Kidney disease Maternal Grandmother   . Heart disease Maternal Grandfather   . Cancer Paternal Grandmother   . Heart disease Paternal Grandfather     SOCIAL HISTORY: Social History   Socioeconomic History  . Marital status: Married    Spouse name: Not on file  . Number of children: 1  . Years of education: Not on file  . Highest education level: Not on file  Occupational History  . Not on file  Tobacco Use  . Smoking status: Current Every Day Smoker    Packs/day: 1.00    Years: 26.00    Pack years: 26.00    Types: Cigarettes  . Smokeless tobacco: Never Used  Substance and  Sexual Activity  . Alcohol use: No    Alcohol/week: 0.0 standard drinks  . Drug use: No  . Sexual activity: Yes    Other Topics Concern  . Not on file  Social History Narrative  . Not on file   Social Determinants of Health   Financial Resource Strain:   . Difficulty of Paying Living Expenses:   Food Insecurity:   . Worried About Charity fundraiser in the Last Year:   . Arboriculturist in the Last Year:   Transportation Needs:   . Film/video editor (Medical):   Marland Kitchen Lack of Transportation (Non-Medical):   Physical Activity:   . Days of Exercise per Week:   . Minutes of Exercise per Session:   Stress:   . Feeling of Stress :   Social Connections:   . Frequency of Communication with Friends and Family:   . Frequency of Social Gatherings with Friends and Family:   . Attends Religious Services:   . Active Member of Clubs or Organizations:   . Attends Archivist Meetings:   Marland Kitchen Marital Status:   Intimate Partner Violence:   . Fear of Current or Ex-Partner:   . Emotionally Abused:   Marland Kitchen Physically Abused:   . Sexually Abused:       PHYSICAL EXAM  Vitals:   08/26/19 1434  BP: (!) 139/95  Pulse: 89  Temp: 98.4 F (36.9 C)  Weight: 210 lb (95.3 kg)  Height: 5' 4"  (1.626 m)   Body mass index is 36.05 kg/m.  Generalized: Well developed, in no acute distress  Neurological examination  Mentation: Alert oriented to time, place, history taking. Follows all commands speech and language fluent Cranial nerve II-XII: Pupils were equal round reactive to light. Extraocular movements were full, visual field were full  Motor: The motor testing reveals 5 over 5 strength of all 4 extremities. Good symmetric motor tone is noted throughout.  Gait and station: Gait is normal.  DIAGNOSTIC DATA (LABS, IMAGING, TESTING) - I reviewed patient records, labs, notes, testing and imaging myself where available.  No flowsheet data found.   Lab Results  Component Value Date   WBC 15.9 (H) 07/23/2017   HGB 13.4 07/23/2017   HCT 40.1 07/23/2017   MCV 85.5 07/23/2017   PLT 401 (H) 07/23/2017       Component Value Date/Time   NA 136 07/23/2017 1514   NA 138 05/16/2016 1708   K 3.8 07/23/2017 1514   CL 103 07/23/2017 1514   CO2 21 (L) 07/23/2017 1514   GLUCOSE 209 (H) 07/23/2017 1514   BUN 15 07/23/2017 1514   BUN 8 05/16/2016 1708   CREATININE 0.72 07/23/2017 1514   CALCIUM 9.7 07/23/2017 1514   PROT 7.3 05/16/2016 1708   ALBUMIN 5.0 05/16/2016 1708   AST 16 05/16/2016 1708   ALT 25 05/16/2016 1708   ALKPHOS 77 05/16/2016 1708   BILITOT <0.2 05/16/2016 1708   GFRNONAA >60 07/23/2017 1514   GFRAA >60 07/23/2017 1514   Lab Results  Component Value Date   CHOL 244 (H) 05/16/2016   HDL 46 05/16/2016   LDLCALC 175 (H) 05/16/2016   TRIG 115 05/16/2016   CHOLHDL 5.3 (H) 05/16/2016   Lab Results  Component Value Date   HGBA1C 6.2 (H) 12/02/2016   No results found for: [id number] Lab Results  Component Value Date   TSH 1.640 05/16/2016       ASSESSMENT AND PLAN 44 y.o. year old female  has a past medical history of Allergy, Anemia, Anxiety, Arthritis, Depression, GERD (gastroesophageal reflux disease), Migraines, Miscarriage, PFO (patent foramen ovale), Sarcoidosis, Stroke ([place]), and TIA (transient ischemic attack). here with     ICD-10-CM   1. OSA on CPAP  G47.33    Z99.89   2. History of atrial flutter  Z86.79   3. History of cardioversion  Z98.890     Buffey admits that she has had more difficulty with compliance of the past few months.  She reports being much more consistent over the past month.  Compliance report reveals suboptimal compliance but I am reassured by her desires to continue usage.  We have reviewed risk of untreated sleep apnea.  I have encouraged her to continue using CPAP nightly and for greater than 4 hours each night.  She will follow-up closely with cardiology.  Well-balanced diet and regular exercise advised.  She may try Biotene products for dry mouth.  She was also advised to adjust humidity settings for comfort.  She will follow-up  with me in 3 months.  She verbalizes understanding and agreement with this plan.   No orders of the defined types were placed in this encounter.    No orders of the defined types were placed in this encounter.     I spent 15 minutes with the patient. 50% of this time was spent counseling and educating patient on plan of care and medications.    Debbora Presto, FNP-C 08/26/2019, 4:04 PM [facility] [address], [place], [place] [address] [phone]   I reviewed the above note and documentation by the Nurse Practitioner and agree with the history, exam, assessment and plan as outlined above. I was available for consultation. Star Age, MD, PhD [facility])

## 2019-09-30 ENCOUNTER — Inpatient Hospital Stay: Payer: BC Managed Care – PPO | Admitting: Registered Nurse

## 2019-10-16 ENCOUNTER — Other Ambulatory Visit: Payer: Self-pay

## 2019-10-16 ENCOUNTER — Ambulatory Visit (INDEPENDENT_AMBULATORY_CARE_PROVIDER_SITE_OTHER): Payer: BC Managed Care – PPO | Admitting: Emergency Medicine

## 2019-10-16 ENCOUNTER — Encounter: Payer: Self-pay | Admitting: Emergency Medicine

## 2019-10-16 VITALS — BP 90/59 | HR 114 | Temp 96.3°F | Resp 16 | Ht 64.0 in | Wt 204.0 lb

## 2019-10-16 DIAGNOSIS — Z8673 Personal history of transient ischemic attack (TIA), and cerebral infarction without residual deficits: Secondary | ICD-10-CM

## 2019-10-16 DIAGNOSIS — I482 Chronic atrial fibrillation, unspecified: Secondary | ICD-10-CM

## 2019-10-16 DIAGNOSIS — Z7901 Long term (current) use of anticoagulants: Secondary | ICD-10-CM

## 2019-10-16 DIAGNOSIS — E119 Type 2 diabetes mellitus without complications: Secondary | ICD-10-CM | POA: Diagnosis not present

## 2019-10-16 LAB — COMPREHENSIVE METABOLIC PANEL
ALT: 56 IU/L — ABNORMAL HIGH (ref 0–32)
AST: 43 IU/L — ABNORMAL HIGH (ref 0–40)
Albumin/Globulin Ratio: 1.8 (ref 1.2–2.2)
Albumin: 4.8 g/dL (ref 3.8–4.8)
Alkaline Phosphatase: 142 IU/L — ABNORMAL HIGH (ref 48–121)
BUN/Creatinine Ratio: 16 (ref 9–23)
BUN: 14 mg/dL (ref 6–24)
Bilirubin Total: 0.4 mg/dL (ref 0.0–1.2)
CO2: 22 mmol/L (ref 20–29)
Calcium: 10.3 mg/dL — ABNORMAL HIGH (ref 8.7–10.2)
Chloride: 97 mmol/L (ref 96–106)
Creatinine, Ser: 0.87 mg/dL (ref 0.57–1.00)
GFR calc Af Amer: 92 mL/min/{1.73_m2} (ref >59)
GFR calc non Af Amer: 80 mL/min/{1.73_m2} (ref >59)
Globulin, Total: 2.6 g/dL (ref 1.5–4.5)
Glucose: 165 mg/dL — ABNORMAL HIGH (ref 65–99)
Potassium: 4.9 mmol/L (ref 3.5–5.2)
Sodium: 137 mmol/L (ref 134–144)
Total Protein: 7.4 g/dL (ref 6.0–8.5)

## 2019-10-16 LAB — CBC WITH DIFFERENTIAL/PLATELET
Basophils Absolute: 0.1 10*3/uL (ref 0.0–0.2)
Basos: 0 %
EOS (ABSOLUTE): 0 10*3/uL (ref 0.0–0.4)
Eos: 0 %
Hematocrit: 48.7 % — ABNORMAL HIGH (ref 34.0–46.6)
Hemoglobin: 16.4 g/dL — ABNORMAL HIGH (ref 11.1–15.9)
Immature Grans (Abs): 0 10*3/uL (ref 0.0–0.1)
Immature Granulocytes: 0 %
Lymphocytes Absolute: 3.6 10*3/uL — ABNORMAL HIGH (ref 0.7–3.1)
Lymphs: 23 %
MCH: 29.2 pg (ref 26.6–33.0)
MCHC: 33.7 g/dL (ref 31.5–35.7)
MCV: 87 fL (ref 79–97)
Monocytes Absolute: 0.6 10*3/uL (ref 0.1–0.9)
Monocytes: 4 %
Neutrophils Absolute: 11.6 10*3/uL — ABNORMAL HIGH (ref 1.4–7.0)
Neutrophils: 73 %
Platelets: 395 10*3/uL (ref 150–450)
RBC: 5.62 x10E6/uL — ABNORMAL HIGH (ref 3.77–5.28)
RDW: 12.3 % (ref 11.7–15.4)
WBC: 15.9 10*3/uL — ABNORMAL HIGH (ref 3.4–10.8)

## 2019-10-16 LAB — LIPID PANEL
Chol/HDL Ratio: 5.1 ratio — ABNORMAL HIGH (ref 0.0–4.4)
Cholesterol, Total: 182 mg/dL (ref 100–199)
HDL: 36 mg/dL — ABNORMAL LOW (ref >39)
LDL Chol Calc (NIH): 105 mg/dL — ABNORMAL HIGH (ref 0–99)
Triglycerides: 238 mg/dL — ABNORMAL HIGH (ref 0–149)
VLDL Cholesterol Cal: 41 mg/dL — ABNORMAL HIGH (ref 5–40)

## 2019-10-16 LAB — POCT GLYCOSYLATED HEMOGLOBIN (HGB A1C): Hemoglobin A1C: 8 % — AB (ref 4.0–5.6)

## 2019-10-16 LAB — GLUCOSE, POCT (MANUAL RESULT ENTRY): POC Glucose: 150 mg/dl — AB (ref 70–99)

## 2019-10-16 MED ORDER — METFORMIN HCL 500 MG PO TABS: 500.0000 mg | ORAL_TABLET | Freq: Two times a day (BID) | ORAL | 3 refills | Status: DC

## 2019-10-16 NOTE — Addendum Note (Signed)
Addended by: Evie Lacks on: 10/16/2019 01:11 PM   Modules accepted: Orders

## 2019-10-16 NOTE — Patient Instructions (Addendum)
   If you have lab work done today you will be contacted with your lab results within the next 2 weeks.  If you have not heard from us then please contact us. The fastest way to get your results is to register for My Chart.   IF you received an x-ray today, you will receive an invoice from [facility]. Please contact [facility] at [phone] with questions or concerns regarding your invoice.   IF you received labwork today, you will receive an invoice from [facility]. Please contact [facility] at [phone] with questions or concerns regarding your invoice.   Our billing staff will not be able to assist you with questions regarding bills from these companies.  You will be contacted with the lab results as soon as they are available. The fastest way to get your results is to activate your My Chart account. Instructions are located on the last page of this paperwork. If you have not heard from us regarding the results in 2 weeks, please contact this office.      Calorie Counting for Weight Loss Calories are units of energy. Your body needs a certain amount of calories from food to keep you going throughout the day. When you eat more calories than your body needs, your body stores the extra calories as fat. When you eat fewer calories than your body needs, your body burns fat to get the energy it needs. Calorie counting means keeping track of how many calories you eat and drink each day. Calorie counting can be helpful if you need to lose weight. If you make sure to eat fewer calories than your body needs, you should lose weight. Ask your health care provider what a healthy weight is for you. For calorie counting to work, you will need to eat the right number of calories in a day in order to lose a healthy amount of weight per week. A dietitian can help you determine how many calories you need in a day and will give you suggestions on how to reach your calorie goal.  A healthy  amount of weight to lose per week is usually 1-2 lb (0.5-0.9 kg). This usually means that your daily calorie intake should be reduced by 500-750 calories.  Eating 1,200 - 1,500 calories per day can help most women lose weight.  Eating 1,500 - 1,800 calories per day can help most men lose weight. What is my plan? My goal is to have __________ calories per day. If I have this many calories per day, I should lose around __________ pounds per week. What do I need to know about calorie counting? In order to meet your daily calorie goal, you will need to:  Find out how many calories are in each food you would like to eat. Try to do this before you eat.  Decide how much of the food you plan to eat.  Write down what you ate and how many calories it had. Doing this is called keeping a food log. To successfully lose weight, it is important to balance calorie counting with a healthy lifestyle that includes regular activity. Aim for 150 minutes of moderate exercise (such as walking) or 75 minutes of vigorous exercise (such as running) each week. Where do I find calorie information?  The number of calories in a food can be found on a Nutrition Facts label. If a food does not have a Nutrition Facts label, try to look up the calories online or ask your dietitian   for help. Remember that calories are listed per serving. If you choose to have more than one serving of a food, you will have to multiply the calories per serving by the amount of servings you plan to eat. For example, the label on a package of bread might say that a serving size is 1 slice and that there are 90 calories in a serving. If you eat 1 slice, you will have eaten 90 calories. If you eat 2 slices, you will have eaten 180 calories. How do I keep a food log? Immediately after each meal, record the following information in your food log:  What you ate. Don't forget to include toppings, sauces, and other extras on the food.  How much you  ate. This can be measured in cups, ounces, or number of items.  How many calories each food and drink had.  The total number of calories in the meal. Keep your food log near you, such as in a small notebook in your pocket, or use a mobile app or website. Some programs will calculate calories for you and show you how many calories you have left for the day to meet your goal. What are some calorie counting tips?   Use your calories on foods and drinks that will fill you up and not leave you hungry: ? Some examples of foods that fill you up are nuts and nut butters, vegetables, lean proteins, and high-fiber foods like whole grains. High-fiber foods are foods with more than 5 g fiber per serving. ? Drinks such as sodas, specialty coffee drinks, alcohol, and juices have a lot of calories, yet do not fill you up.  Eat nutritious foods and avoid empty calories. Empty calories are calories you get from foods or beverages that do not have many vitamins or protein, such as candy, sweets, and soda. It is better to have a nutritious high-calorie food (such as an avocado) than a food with few nutrients (such as a bag of chips).  Know how many calories are in the foods you eat most often. This will help you calculate calorie counts faster.  Pay attention to calories in drinks. Low-calorie drinks include water and unsweetened drinks.  Pay attention to nutrition labels for "low fat" or "fat free" foods. These foods sometimes have the same amount of calories or more calories than the full fat versions. They also often have added sugar, starch, or salt, to make up for flavor that was removed with the fat.  Find a way of tracking calories that works for you. Get creative. Try different apps or programs if writing down calories does not work for you. What are some portion control tips?  Know how many calories are in a serving. This will help you know how many servings of a certain food you can have.  Use a  measuring cup to measure serving sizes. You could also try weighing out portions on a kitchen scale. With time, you will be able to estimate serving sizes for some foods.  Take some time to put servings of different foods on your favorite plates, bowls, and cups so you know what a serving looks like.  Try not to eat straight from a bag or box. Doing this can lead to overeating. Put the amount you would like to eat in a cup or on a plate to make sure you are eating the right portion.  Use smaller plates, glasses, and bowls to prevent overeating.  Try not to multitask (for   example, watch TV or use your computer) while eating. If it is time to eat, sit down at a table and enjoy your food. This will help you to know when you are full. It will also help you to be aware of what you are eating and how much you are eating. What are tips for following this plan? Reading food labels  Check the calorie count compared to the serving size. The serving size may be smaller than what you are used to eating.  Check the source of the calories. Make sure the food you are eating is high in vitamins and protein and low in saturated and trans fats. Shopping  Read nutrition labels while you shop. This will help you make healthy decisions before you decide to purchase your food.  Make a grocery list and stick to it. Cooking  Try to cook your favorite foods in a healthier way. For example, try baking instead of frying.  Use low-fat dairy products. Meal planning  Use more fruits and vegetables. Half of your plate should be fruits and vegetables.  Include lean proteins like poultry and fish. How do I count calories when eating out?  Ask for smaller portion sizes.  Consider sharing an entree and sides instead of getting your own entree.  If you get your own entree, eat only half. Ask for a box at the beginning of your meal and put the rest of your entree in it so you are not tempted to eat it.  If calories  are listed on the menu, choose the lower calorie options.  Choose dishes that include vegetables, fruits, whole grains, low-fat dairy products, and lean protein.  Choose items that are boiled, broiled, grilled, or steamed. Stay away from items that are buttered, battered, fried, or served with cream sauce. Items labeled "crispy" are usually fried, unless stated otherwise.  Choose water, low-fat milk, unsweetened iced tea, or other drinks without added sugar. If you want an alcoholic beverage, choose a lower calorie option such as a glass of wine or light beer.  Ask for dressings, sauces, and syrups on the side. These are usually high in calories, so you should limit the amount you eat.  If you want a salad, choose a garden salad and ask for grilled meats. Avoid extra toppings like bacon, cheese, or fried items. Ask for the dressing on the side, or ask for olive oil and vinegar or lemon to use as dressing.  Estimate how many servings of a food you are given. For example, a serving of cooked rice is  cup or about the size of half a baseball. Knowing serving sizes will help you be aware of how much food you are eating at restaurants. The list below tells you how big or small some common portion sizes are based on everyday objects: ? 1 oz--4 stacked dice. ? 3 oz--1 deck of cards. ? 1 tsp--1 die. ? 1 Tbsp-- a ping-pong ball. ? 2 Tbsp--1 ping-pong ball. ?  cup-- baseball. ? 1 cup--1 baseball. Summary  Calorie counting means keeping track of how many calories you eat and drink each day. If you eat fewer calories than your body needs, you should lose weight.  A healthy amount of weight to lose per week is usually 1-2 lb (0.5-0.9 kg). This usually means reducing your daily calorie intake by 500-750 calories.  The number of calories in a food can be found on a Nutrition Facts label. If a food does not have a Nutrition   Facts label, try to look up the calories online or ask your dietitian for  help.  Use your calories on foods and drinks that will fill you up, and not on foods and drinks that will leave you hungry.  Use smaller plates, glasses, and bowls to prevent overeating. This information is not intended to replace advice given to you by your health care provider. Make sure you discuss any questions you have with your health care provider. Document Revised: 12/21/2017 Document Reviewed: 03/03/2016 Elsevier Patient Education  2020 [employer]  Diabetes Mellitus and Nutrition, Adult When you have diabetes (diabetes mellitus), it is very important to have healthy eating habits because your blood sugar (glucose) levels are greatly affected by what you eat and drink. Eating healthy foods in the appropriate amounts, at about the same times every day, can help you:  Control your blood glucose.  Lower your risk of heart disease.  Improve your blood pressure.  Reach or maintain a healthy weight. Every person with diabetes is different, and each person has different needs for a meal plan. Your health care provider may recommend that you work with a diet and nutrition specialist (dietitian) to make a meal plan that is best for you. Your meal plan may vary depending on factors such as:  The calories you need.  The medicines you take.  Your weight.  Your blood glucose, blood pressure, and cholesterol levels.  Your activity level.  Other health conditions you have, such as heart or kidney disease. How do carbohydrates affect me? Carbohydrates, also called carbs, affect your blood glucose level more than any other type of food. Eating carbs naturally raises the amount of glucose in your blood. Carb counting is a method for keeping track of how many carbs you eat. Counting carbs is important to keep your blood glucose at a healthy level, especially if you use insulin or take certain oral diabetes medicines. It is important to know how many carbs you can safely have in each meal.  This is different for every person. Your dietitian can help you calculate how many carbs you should have at each meal and for each snack. Foods that contain carbs include:  Bread, cereal, rice, pasta, and crackers.  Potatoes and corn.  Peas, beans, and lentils.  Milk and yogurt.  Fruit and juice.  Desserts, such as cakes, cookies, ice cream, and candy. How does alcohol affect me? Alcohol can cause a sudden decrease in blood glucose (hypoglycemia), especially if you use insulin or take certain oral diabetes medicines. Hypoglycemia can be a life-threatening condition. Symptoms of hypoglycemia (sleepiness, dizziness, and confusion) are similar to symptoms of having too much alcohol. If your health care provider says that alcohol is safe for you, follow these guidelines:  Limit alcohol intake to no more than 1 drink per day for nonpregnant women and 2 drinks per day for men. One drink equals 12 oz of beer, 5 oz of wine, or 1 oz of hard liquor.  Do not drink on an empty stomach.  Keep yourself hydrated with water, diet soda, or unsweetened iced tea.  Keep in mind that regular soda, juice, and other mixers may contain a lot of sugar and must be counted as carbs. What are tips for following this plan?  Reading food labels  Start by checking the serving size on the "Nutrition Facts" label of packaged foods and drinks. The amount of calories, carbs, fats, and other nutrients listed on the label is based on one serving   of the item. Many items contain more than one serving per package.  Check the total grams (g) of carbs in one serving. You can calculate the number of servings of carbs in one serving by dividing the total carbs by 15. For example, if a food has 30 g of total carbs, it would be equal to 2 servings of carbs.  Check the number of grams (g) of saturated and trans fats in one serving. Choose foods that have low or no amount of these fats.  Check the number of milligrams (mg) of  salt (sodium) in one serving. Most people should limit total sodium intake to less than 2,300 mg per day.  Always check the nutrition information of foods labeled as "low-fat" or "nonfat". These foods may be higher in added sugar or refined carbs and should be avoided.  Talk to your dietitian to identify your daily goals for nutrients listed on the label. Shopping  Avoid buying canned, premade, or processed foods. These foods tend to be high in fat, sodium, and added sugar.  Shop around the outside edge of the grocery store. This includes fresh fruits and vegetables, bulk grains, fresh meats, and fresh dairy. Cooking  Use low-heat cooking methods, such as baking, instead of high-heat cooking methods like deep frying.  Cook using healthy oils, such as olive, canola, or sunflower oil.  Avoid cooking with butter, cream, or high-fat meats. Meal planning  Eat meals and snacks regularly, preferably at the same times every day. Avoid going long periods of time without eating.  Eat foods high in fiber, such as fresh fruits, vegetables, beans, and whole grains. Talk to your dietitian about how many servings of carbs you can eat at each meal.  Eat 4-6 ounces (oz) of lean protein each day, such as lean meat, chicken, fish, eggs, or tofu. One oz of lean protein is equal to: ? 1 oz of meat, chicken, or fish. ? 1 egg. ?  cup of tofu.  Eat some foods each day that contain healthy fats, such as avocado, nuts, seeds, and fish. Lifestyle  Check your blood glucose regularly.  Exercise regularly as told by your health care provider. This may include: ? 150 minutes of moderate-intensity or vigorous-intensity exercise each week. This could be brisk walking, biking, or water aerobics. ? Stretching and doing strength exercises, such as yoga or weightlifting, at least 2 times a week.  Take medicines as told by your health care provider.  Do not use any products that contain nicotine or tobacco, such  as cigarettes and e-cigarettes. If you need help quitting, ask your health care provider.  Work with a counselor or diabetes educator to identify strategies to manage stress and any emotional and social challenges. Questions to ask a health care provider  Do I need to meet with a diabetes educator?  Do I need to meet with a dietitian?  What number can I call if I have questions?  When are the best times to check my blood glucose? Where to find more information:  American Diabetes Association: diabetes.org  Academy of Nutrition and Dietetics: www.eatright.org  National Institute of Diabetes and Digestive and Kidney Diseases (NIH): www.niddk.nih.gov Summary  A healthy meal plan will help you control your blood glucose and maintain a healthy lifestyle.  Working with a diet and nutrition specialist (dietitian) can help you make a meal plan that is best for you.  Keep in mind that carbohydrates (carbs) and alcohol have immediate effects on your blood glucose   levels. It is important to count carbs and to use alcohol carefully. This information is not intended to replace advice given to you by your health care provider. Make sure you discuss any questions you have with your health care provider. Document Revised: 03/16/2017 Document Reviewed: 05/08/2016 Elsevier Patient Education  2020 [employer]  

## 2019-10-16 NOTE — Progress Notes (Addendum)
Stan Headmanda S Needle 44 y.o.   Chief Complaint  Patient presents with  . Atrial Fibrillation    [facility]   Hospital Course: 44 year old F with prior history of PFO closure, A. fib with RVR, cardioversion x2 in the past on anticoagulation, presents to the ED with palpitations, nausea, 1 episode of nonbilious nonbloody vomiting, initially complained of chest pain associated shortness of breath which has resolved by the time she came to the ED. Also had 2 episodes of nonassociated nosebleeds once in the morning and once after the palpitations resolved on their own but has remained stable in the ED. As her symptoms persisted she came to the ED for further evaluation. Denies fever, abdominal pain, nausea, vomiting, headache, dysuria, continues to smoke but denies alcohol use. In the ED, her blood pressure was noted to be normal, SPO2 96% on room air, has had persistent tachycardia with telemetry monitor showing A. fib with RVR, labs showed mild leukocytosis with stable hemoglobin, potassium and magnesium low and replaced. Chest x-ray unremarkable. EKG showed A. fib with RVR with diffuse ST depressions, troponins negative x3. Cardiology consult obtained and she was started on Cardizem gtt/metoprolol. Patient converted to NSR early in the morning hours on 6/4, she is also currently on flecainide 100 mg twice daily. She has multiple medical comorbidities including pulmonary sarcoidosis, obstructive sleep apnea, obesity, besides tobacco use. Her 2D echocardiogram from March 2021 showed ejection fraction of 55 to 60%. She was admitted for persistent atrial fibrillation with rapid ventricular heart rate-ultralights have been replaced and she converted to normal sinus rhythm. Cardiology consulted and as per the recommendations, patient to continue with Cardizem CD 120 mg daily, metoprolol 50 mg 3 times daily, besides flecainide 100 mg twice daily. Recommend to continue with  anticoagulation, smoking cessation, follow-up with cardiology as an outpatient for possible cryoablation in the near future.  HISTORY OF PRESENT ILLNESS: This is a 44 y.o. female here for follow-up of multiple chronic medical problems. Was recently in the hospital with atrial fibrillation and rapid ventricular response.  Presently on Cardizem and metoprolol.  Also on Eliquis and flecainide 100 mg twice a day.  Cardiac enzymes negative.  No MI.  Cardioverted while in the hospital but reverts back to A. fib shortly after discharge.  Had cardioversion several days after discharge. Smoker, trying to quit. Concerned about diabetes.  States her glucose has remained high. Also concerned about high cholesterol, presently on statin therapy.  HPI   Prior to Admission medications   Medication Sig Start Date End Date Taking? Authorizing Provider  albuterol (PROVENTIL HFA;VENTOLIN HFA) 108 (90 Base) MCG/ACT inhaler Inhale 1-2 puffs into the lungs every 4 (four) hours as needed. 07/16/17  Yes Barnett AbuWiseman, [place]any D, PA-C  Apixaban (ELIQUIS PO) Take 5 mg by mouth 2 (two) times daily.    Yes [provider]  aspirin (GOODSENSE ASPIRIN) 325 MG tablet Take 325 mg by mouth.   Yes [provider]  atorvastatin (LIPITOR) 20 MG tablet Take 20 mg by mouth daily.   Yes [provider]  Earlie ServerBREO ELLIPTA [phone] MCG/INH AEPB INHALE 1 PUFF INTO THE LUNGS DAILY 01/09/19  Yes Labrandon Knoch, Eilleen KempfMiguel Jose, MD  cetirizine (ZYRTEC) 10 MG tablet Take 10 mg by mouth daily.   Yes [provider]  diltiazem (CARDIZEM) 120 MG tablet Take 120 mg by mouth daily.   Yes [provider]  estradiol (ESTRACE) 2 MG tablet Take 2 mg by mouth daily.   Yes  [provider]  flecainide (TAMBOCOR) 100 MG tablet Take 100 mg by mouth 2 (two) times daily.   Yes [provider]  fluticasone (FLONASE) 50 MCG/ACT nasal spray Place 2 sprays into both nostrils daily. 01/01/18  Yes Yunior Jain, Eilleen Kempf, MD    metoprolol tartrate (LOPRESSOR) 50 MG tablet Take 50 mg by mouth 2 (two) times daily.   Yes [provider]  montelukast (SINGULAIR) 10 MG tablet TAKE 1 TABLET BY MOUTH EVERYDAY AT BEDTIME 07/04/19  Yes Maika Mcelveen, Eilleen Kempf, MD  venlafaxine XR (EFFEXOR-XR) 150 MG 24 hr capsule Take 150 mg by mouth daily with breakfast.   Yes [provider]  venlafaxine XR (EFFEXOR-XR) 75 MG 24 hr capsule Take 75 mg by mouth. 08/22/16  Yes [provider]  nicotine (NICODERM CQ - DOSED IN MG/24 HOURS) 14 mg/24hr patch PLACE 1 PATCH (14 MG TOTAL) ONTO THE SKIN DAILY. Patient not taking: Reported on 10/16/2019 02/03/19   Georgina Quint, MD  prochlorperazine (COMPAZINE) 10 MG tablet Take 1 tablet (10 mg total) by mouth every 8 (eight) hours as needed for nausea or vomiting. Patient not taking: Reported on 10/16/2019 05/20/18   Myles Lipps, MD    Allergies  Allergen Reactions  . Gramineae Pollens Hives  . Clindamycin   . Clindamycin/Lincomycin   . Keflex [Cephalexin]   . Lincomycin Hcl   . Penicillins   . Sulfa Antibiotics Hives  . Sulfonamide Derivatives     Patient Active Problem List   Diagnosis Date Noted  . History of asthma 01/01/2018  . Chronic seasonal allergic rhinitis 01/01/2018  . Depression 09/07/2016  . 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 Smoking addiction 06/05/2014  . SARCOIDOSIS 04/05/2007    Past Medical History:  Diagnosis Date  . Allergy   . Anemia   . Anxiety   . Arthritis   . Depression   . GERD (gastroesophageal reflux disease)   . Migraines   . Miscarriage   . PFO (patent foramen ovale)   . Sarcoidosis   . Stroke (HCC)   . TIA (transient ischemic attack)     Past Surgical History:  Procedure Laterality Date  . BRONCHOSCOPY    . CESAREAN SECTION     miscarriage  . CHOLECYSTECTOMY    . HAND SURGERY    . miscarriage  08/10/14  . NASAL SINUS SURGERY       Social History   Socioeconomic History  . Marital status: Married    Spouse name: Not on file  . Number of children: 1  . Years of education: Not on file  . Highest education level: Not on file  Occupational History  . Not on file  Tobacco Use  . Smoking status: Current Every Day Smoker    Packs/day: 1.00    Years: 26.00    Pack years: 26.00    Types: Cigarettes  . Smokeless tobacco: Never Used  Vaping Use  . Vaping Use: Never used  Substance and Sexual Activity  . Alcohol use: No    Alcohol/week: 0.0 standard drinks  . Drug use: No  . Sexual activity: Yes  Other Topics Concern  . Not on file  Social History Narrative  . Not on file   Social Determinants of Health   Financial Resource Strain:   . Difficulty of Paying Living Expenses:   Food Insecurity:   . Worried About Programme researcher, broadcasting/film/video in the Last Year:   .  Ran Out of Food in the Last Year:   Transportation Needs:   . Freight forwarder (Medical):   Marland Kitchen Lack of Transportation (Non-Medical):   Physical Activity:   . Days of Exercise per Week:   . Minutes of Exercise per Session:   Stress:   . Feeling of Stress :   Social Connections:   . Frequency of Communication with Friends and Family:   . Frequency of Social Gatherings with Friends and Family:   . Attends Religious Services:   . Active Member of Clubs or Organizations:   . Attends Banker Meetings:   Marland Kitchen Marital Status:   Intimate Partner Violence:   . Fear of Current or Ex-Partner:   . Emotionally Abused:   Marland Kitchen Physically Abused:   . Sexually Abused:     Family History  Problem Relation Age of Onset  . Osteoporosis Mother   . Asthma Mother   . Diabetes Father   . Heart disease Father   . Kidney disease Maternal Grandmother   . Heart disease Maternal Grandfather   . Cancer Paternal Grandmother   . Heart disease Paternal Grandfather      Review of Systems  Constitutional: Negative.  Negative for chills and fever.  HENT:  Negative.  Negative for congestion and sore throat.   Respiratory: Negative.  Negative for cough and shortness of breath.   Cardiovascular: Positive for palpitations. Negative for chest pain.  Gastrointestinal: Negative.  Negative for abdominal pain, diarrhea, nausea and vomiting.  Genitourinary: Negative.  Negative for dysuria and hematuria.  Musculoskeletal: Negative.  Negative for myalgias and neck pain.  Skin: Negative.  Negative for rash.  Neurological: Negative.  Negative for dizziness and headaches.  All other systems reviewed and are negative.  Today's Vitals   10/16/19 1057  BP: (!) 90/59  Pulse: (!) 114  Resp: 16  Temp: (!) 96.3 F (35.7 C)  TempSrc: Temporal  SpO2: 97%  Weight: 204 lb (92.5 kg)  Height:  (1.626 m)   Body mass index is 35.02 kg/m.  Repeat vital signs: Blood pressure 110/70 and heart rate 90 Physical Exam Vitals reviewed.  Constitutional:      Appearance: Normal appearance. She is obese.  HENT:     Head: Normocephalic.  Eyes:     Extraocular Movements: Extraocular movements intact.     Pupils: Pupils are equal, round, and reactive to light.  Cardiovascular:     Rate and Rhythm: Normal rate. Rhythm irregular.     Pulses: Normal pulses.     Heart sounds: Normal heart sounds.  Pulmonary:     Effort: Pulmonary effort is normal.     Breath sounds: Normal breath sounds.  Musculoskeletal:        General: No tenderness. Normal range of motion.     Cervical back: Normal range of motion and neck supple.     Right lower leg: No edema.     Left lower leg: No edema.  Skin:    General: Skin is warm and dry.     Capillary Refill: Capillary refill takes less than 2 seconds.  Neurological:     General: No focal deficit present.     Mental Status: She is alert and oriented to person, place, and time.  Psychiatric:        Mood and Affect: Mood normal.        Behavior: Behavior normal.    Results for orders placed or performed in visit on 10/16/19  (from the past  24 hour(s))  POCT glucose (manual entry)     Status: Abnormal   Collection Time: 10/16/19 12:07 PM  Result Value Ref Range   POC Glucose 150 (A) 70 - 99 mg/dl  POCT glycosylated hemoglobin (Hb A1C)     Status: Abnormal   Collection Time: 10/16/19 12:07 PM  Result Value Ref Range   Hemoglobin A1C 8.0 (A) 4.0 - 5.6 %   HbA1c POC (<> result, manual entry)     HbA1c, POC (prediabetic range)     HbA1c, POC (controlled diabetic range)      A total of 30 minutes was spent with the patient, greater than 50% of which was in counseling/coordination of care regarding new onset diabetes and other chronic medical problems, cardiovascular risks associated with this condition, review of all medications, review of most recent office visit notes, review of most recent hospital stay notes, diet and nutrition, review of most recent blood work results including today's hemoglobin A1c, prognosis and need for follow-up.  ASSESSMENT & PLAN: New onset type 2 diabetes mellitus (HCC) New onset diabetes with hemoglobin A1c at 8.0. We will start Metformin 500 mg twice a day along with diet and nutrition. Follow-up in 3 months.  Nautia was seen today for atrial fibrillation.  Diagnoses and all orders for this visit:  Chronic atrial fibrillation (HCC) -     CBC with Differential/Platelet -     Comprehensive metabolic panel -     Lipid panel  Current use of long term anticoagulation -     CBC with Differential/Platelet  Personal history of transient ischemic attack (TIA), and cerebral infarction without residual deficits  New onset type 2 diabetes mellitus (HCC) -     POCT glucose (manual entry) -     POCT glycosylated hemoglobin (Hb A1C) -     metFORMIN (GLUCOPHAGE) 500 MG tablet; Take 1 tablet (500 mg total) by mouth 2 (two) times daily with a meal.     Patient Instructions       If you have lab work done today you will be contacted with your lab results within the next 2 weeks.   If you have not heard from [place] then please contact us. The fastest way to get your results is to register for My Chart.   IF you received an x-ray today, you will receive an invoice from [facility]. Please contact [facility] at [phone] with questions or concerns regarding your invoice.   IF you received labwork today, you will receive an invoice from [facility]. Please contact [facility] at [phone] with questions or concerns regarding your invoice.   Our billing staff will not be able to assist you with questions regarding bills from these companies.  You will be contacted with the lab results as soon as they are available. The fastest way to get your results is to activate your My Chart account. Instructions are located on the last page of this paperwork. If you have not heard from [place] regarding the results in 2 weeks, please contact this office.     Calorie Counting for Weight Loss Calories are units of energy. Your body needs a certain amount of calories from food to keep you going throughout the day. When you eat more calories than your body needs, your body stores the extra calories as fat. When you eat fewer calories than your body needs, your body burns fat to get the energy it needs. Calorie counting means keeping track of how many calories you eat and  drink each day. Calorie counting can be helpful if you need to lose weight. If you make sure to eat fewer calories than your body needs, you should lose weight. Ask your health care provider what a healthy weight is for you. For calorie counting to work, you will need to eat the right number of calories in a day in order to lose a healthy amount of weight per week. A dietitian can help you determine how many calories you need in a day and will give you suggestions on how to reach your calorie goal.  A healthy amount of weight to lose per week is usually 1-2 lb (0.5-0.9 kg). This usually means that your daily calorie  intake should be reduced by 500-750 calories.  Eating 1,200 - 1,500 calories per day can help most women lose weight.  Eating 1,500 - 1,800 calories per day can help most men lose weight. What is my plan? My goal is to have __________ calories per day. If I have this many calories per day, I should lose around __________ pounds per week. What do I need to know about calorie counting? In order to meet your daily calorie goal, you will need to:  Find out how many calories are in each food you would like to eat. Try to do this before you eat.  Decide how much of the food you plan to eat.  Write down what you ate and how many calories it had. Doing this is called keeping a food log. To successfully lose weight, it is important to balance calorie counting with a healthy lifestyle that includes regular activity. Aim for 150 minutes of moderate exercise (such as walking) or 75 minutes of vigorous exercise (such as running) each week. Where do I find calorie information?  The number of calories in a food can be found on a Nutrition Facts label. If a food does not have a Nutrition Facts label, try to look up the calories online or ask your dietitian for help. Remember that calories are listed per serving. If you choose to have more than one serving of a food, you will have to multiply the calories per serving by the amount of servings you plan to eat. For example, the label on a package of bread might say that a serving size is 1 slice and that there are 90 calories in a serving. If you eat 1 slice, you will have eaten 90 calories. If you eat 2 slices, you will have eaten 180 calories. How do I keep a food log? Immediately after each meal, record the following information in your food log:  What you ate. Don't forget to include toppings, sauces, and other extras on the food.  How much you ate. This can be measured in cups, ounces, or number of items.  How many calories each food and drink  had.  The total number of calories in the meal. Keep your food log near you, such as in a small notebook in your pocket, or use a mobile app or website. Some programs will calculate calories for you and show you how many calories you have left for the day to meet your goal. What are some calorie counting tips?   Use your calories on foods and drinks that will fill you up and not leave you hungry: ? Some examples of foods that fill you up are nuts and nut butters, vegetables, lean proteins, and high-fiber foods like whole grains. High-fiber foods are foods with more than  5 g fiber per serving. ? Drinks such as sodas, specialty coffee drinks, alcohol, and juices have a lot of calories, yet do not fill you up.  Eat nutritious foods and avoid empty calories. Empty calories are calories you get from foods or beverages that do not have many vitamins or protein, such as candy, sweets, and soda. It is better to have a nutritious high-calorie food (such as an avocado) than a food with few nutrients (such as a bag of chips).  Know how many calories are in the foods you eat most often. This will help you calculate calorie counts faster.  Pay attention to calories in drinks. Low-calorie drinks include water and unsweetened drinks.  Pay attention to nutrition labels for "low fat" or "fat free" foods. These foods sometimes have the same amount of calories or more calories than the full fat versions. They also often have added sugar, starch, or salt, to make up for flavor that was removed with the fat.  Find a way of tracking calories that works for you. Get creative. Try different apps or programs if writing down calories does not work for you. What are some portion control tips?  Know how many calories are in a serving. This will help you know how many servings of a certain food you can have.  Use a measuring cup to measure serving sizes. You could also try weighing out portions on a kitchen scale. With  time, you will be able to estimate serving sizes for some foods.  Take some time to put servings of different foods on your favorite plates, bowls, and cups so you know what a serving looks like.  Try not to eat straight from a bag or box. Doing this can lead to overeating. Put the amount you would like to eat in a cup or on a plate to make sure you are eating the right portion.  Use smaller plates, glasses, and bowls to prevent overeating.  Try not to multitask (for example, watch TV or use your computer) while eating. If it is time to eat, sit down at a table and enjoy your food. This will help you to know when you are full. It will also help you to be aware of what you are eating and how much you are eating. What are tips for following this plan? Reading food labels  Check the calorie count compared to the serving size. The serving size may be smaller than what you are used to eating.  Check the source of the calories. Make sure the food you are eating is high in vitamins and protein and low in saturated and trans fats. Shopping  Read nutrition labels while you shop. This will help you make healthy decisions before you decide to purchase your food.  Make a grocery list and stick to it. Cooking  Try to cook your favorite foods in a healthier way. For example, try baking instead of frying.  Use low-fat dairy products. Meal planning  Use more fruits and vegetables. Half of your plate should be fruits and vegetables.  Include lean proteins like poultry and fish. How do I count calories when eating out?  Ask for smaller portion sizes.  Consider sharing an entree and sides instead of getting your own entree.  If you get your own entree, eat only half. Ask for a box at the beginning of your meal and put the rest of your entree in it so you are not tempted to eat it.  If calories are listed on the menu, choose the lower calorie options.  Choose dishes that include vegetables,  fruits, whole grains, low-fat dairy products, and lean protein.  Choose items that are boiled, broiled, grilled, or steamed. Stay away from items that are buttered, battered, fried, or served with cream sauce. Items labeled "crispy" are usually fried, unless stated otherwise.  Choose water, low-fat milk, unsweetened iced tea, or other drinks without added sugar. If you want an alcoholic beverage, choose a lower calorie option such as a glass of wine or light beer.  Ask for dressings, sauces, and syrups on the side. These are usually high in calories, so you should limit the amount you eat.  If you want a salad, choose a garden salad and ask for grilled meats. Avoid extra toppings like bacon, cheese, or fried items. Ask for the dressing on the side, or ask for olive oil and vinegar or lemon to use as dressing.  Estimate how many servings of a food you are given. For example, a serving of cooked rice is  cup or about the size of half a baseball. Knowing serving sizes will help you be aware of how much food you are eating at restaurants. The list below tells you how big or small some common portion sizes are based on everyday objects: ? 1 oz--4 stacked dice. ? 3 oz--1 deck of cards. ? 1 tsp--1 die. ? 1 Tbsp-- a ping-pong ball. ? 2 Tbsp--1 ping-pong ball. ?  cup-- baseball. ? 1 cup--1 baseball. Summary  Calorie counting means keeping track of how many calories you eat and drink each day. If you eat fewer calories than your body needs, you should lose weight.  A healthy amount of weight to lose per week is usually 1-2 lb (0.5-0.9 kg). This usually means reducing your daily calorie intake by 500-750 calories.  The number of calories in a food can be found on a Nutrition Facts label. If a food does not have a Nutrition Facts label, try to look up the calories online or ask your dietitian for help.  Use your calories on foods and drinks that will fill you up, and not on foods and drinks that  will leave you hungry.  Use smaller plates, glasses, and bowls to prevent overeating. This information is not intended to replace advice given to you by your health care provider. Make sure you discuss any questions you have with your health care provider. Document Revised: 12/21/2017 Document Reviewed: 03/03/2016 Elsevier Patient Education  2020 [employer].  Diabetes Mellitus and Nutrition, Adult When you have diabetes (diabetes mellitus), it is very important to have healthy eating habits because your blood sugar (glucose) levels are greatly affected by what you eat and drink. Eating healthy foods in the appropriate amounts, at about the same times every day, can help you:  Control your blood glucose.  Lower your risk of heart disease.  Improve your blood pressure.  Reach or maintain a healthy weight. Every person with diabetes is different, and each person has different needs for a meal plan. Your health care provider may recommend that you work with a diet and nutrition specialist (dietitian) to make a meal plan that is best for you. Your meal plan may vary depending on factors such as:  The calories you need.  The medicines you take.  Your weight.  Your blood glucose, blood pressure, and cholesterol levels.  Your activity level.  Other health conditions you have, such as heart or kidney  disease. How do carbohydrates affect me? Carbohydrates, also called carbs, affect your blood glucose level more than any other type of food. Eating carbs naturally raises the amount of glucose in your blood. Carb counting is a method for keeping track of how many carbs you eat. Counting carbs is important to keep your blood glucose at a healthy level, especially if you use insulin or take certain oral diabetes medicines. It is important to know how many carbs you can safely have in each meal. This is different for every person. Your dietitian can help you calculate how many carbs you should  have at each meal and for each snack. Foods that contain carbs include:  Bread, cereal, rice, pasta, and crackers.  Potatoes and corn.  Peas, beans, and lentils.  Milk and yogurt.  Fruit and juice.  Desserts, such as cakes, cookies, ice cream, and candy. How does alcohol affect me? Alcohol can cause a sudden decrease in blood glucose (hypoglycemia), especially if you use insulin or take certain oral diabetes medicines. Hypoglycemia can be a life-threatening condition. Symptoms of hypoglycemia (sleepiness, dizziness, and confusion) are similar to symptoms of having too much alcohol. If your health care provider says that alcohol is safe for you, follow these guidelines:  Limit alcohol intake to no more than 1 drink per day for nonpregnant women and 2 drinks per day for men. One drink equals 12 oz of beer, 5 oz of wine, or 1 oz of hard liquor.  Do not drink on an empty stomach.  Keep yourself hydrated with water, diet soda, or unsweetened iced tea.  Keep in mind that regular soda, juice, and other mixers may contain a lot of sugar and must be counted as carbs. What are tips for following this plan?  Reading food labels  Start by checking the serving size on the "Nutrition Facts" label of packaged foods and drinks. The amount of calories, carbs, fats, and other nutrients listed on the label is based on one serving of the item. Many items contain more than one serving per package.  Check the total grams (g) of carbs in one serving. You can calculate the number of servings of carbs in one serving by dividing the total carbs by 15. For example, if a food has 30 g of total carbs, it would be equal to 2 servings of carbs.  Check the number of grams (g) of saturated and trans fats in one serving. Choose foods that have low or no amount of these fats.  Check the number of milligrams (mg) of salt (sodium) in one serving. Most people should limit total sodium intake to less than 2,300 mg per  day.  Always check the nutrition information of foods labeled as "low-fat" or "nonfat". These foods may be higher in added sugar or refined carbs and should be avoided.  Talk to your dietitian to identify your daily goals for nutrients listed on the label. Shopping  Avoid buying canned, premade, or processed foods. These foods tend to be high in fat, sodium, and added sugar.  Shop around the outside edge of the grocery store. This includes fresh fruits and vegetables, bulk grains, fresh meats, and fresh dairy. Cooking  Use low-heat cooking methods, such as baking, instead of high-heat cooking methods like deep frying.  Cook using healthy oils, such as olive, canola, or sunflower oil.  Avoid cooking with butter, cream, or high-fat meats. Meal planning  Eat meals and snacks regularly, preferably at the same times every day. Avoid  going long periods of time without eating.  Eat foods high in fiber, such as fresh fruits, vegetables, beans, and whole grains. Talk to your dietitian about how many servings of carbs you can eat at each meal.  Eat 4-6 ounces (oz) of lean protein each day, such as lean meat, chicken, fish, eggs, or tofu. One oz of lean protein is equal to: ? 1 oz of meat, chicken, or fish. ? 1 egg. ?  cup of tofu.  Eat some foods each day that contain healthy fats, such as avocado, nuts, seeds, and fish. Lifestyle  Check your blood glucose regularly.  Exercise regularly as told by your health care provider. This may include: ? 150 minutes of moderate-intensity or vigorous-intensity exercise each week. This could be brisk walking, biking, or water aerobics. ? Stretching and doing strength exercises, such as yoga or weightlifting, at least 2 times a week.  Take medicines as told by your health care provider.  Do not use any products that contain nicotine or tobacco, such as cigarettes and e-cigarettes. If you need help quitting, ask your health care provider.  Work with  a Veterinary surgeon or diabetes educator to identify strategies to manage stress and any emotional and social challenges. Questions to ask a health care provider  Do I need to meet with a diabetes educator?  Do I need to meet with a dietitian?  What number can I call if I have questions?  When are the best times to check my blood glucose? Where to find more information:  American Diabetes Association: diabetes.org  Academy of Nutrition and Dietetics: www.eatright.[employer] of Diabetes and Digestive and Kidney Diseases (NIH): CarFlippers.tn Summary  A healthy meal plan will help you control your blood glucose and maintain a healthy lifestyle.  Working with a diet and nutrition specialist (dietitian) can help you make a meal plan that is best for you.  Keep in mind that carbohydrates (carbs) and alcohol have immediate effects on your blood glucose levels. It is important to count carbs and to use alcohol carefully. This information is not intended to replace advice given to you by your health care provider. Make sure you discuss any questions you have with your health care provider. Document Revised: 03/16/2017 Document Reviewed: 05/08/2016 Elsevier Patient Education  2020 Elsevier Inc.      Edwina Barth, MD [facility]

## 2019-10-16 NOTE — Assessment & Plan Note (Signed)
New onset diabetes with hemoglobin A1c at 8.0. We will start Metformin 500 mg twice a day along with diet and nutrition. Follow-up in 3 months.

## 2019-10-21 ENCOUNTER — Other Ambulatory Visit: Payer: Self-pay | Admitting: Emergency Medicine

## 2019-10-21 ENCOUNTER — Encounter: Payer: Self-pay | Admitting: Emergency Medicine

## 2019-10-21 MED ORDER — ATORVASTATIN CALCIUM 20 MG PO TABS: 20.0000 mg | ORAL_TABLET | Freq: Every day | ORAL | 3 refills | Status: DC

## 2019-10-21 NOTE — Telephone Encounter (Signed)
She has new onset uncontrolled diabetes and most of her abnormal results are related to this.  Abnormal lipid profile.  She needs cholesterol medication.  Mild elevation of liver enzymes and alkaline phosphatase most likely due to fatty liver.  Diet and nutrition recommended. Increased white cell count could also be related to uncontrolled diabetes but if she is having any urinary symptoms she should bring a urine sample for testing.  Thanks.

## 2019-10-21 NOTE — Telephone Encounter (Signed)
Pt would like to know if you will start prescribing her Atorvastatin 20 mg, also would like to know what her lab results show. Please advise

## 2019-11-14 ENCOUNTER — Encounter: Payer: Self-pay | Admitting: Family Medicine

## 2019-11-26 NOTE — Progress Notes (Signed)
I attempted to reach patient via telephone at time of appt and at 12:40p. No answer. Messages left on mobile number.

## 2019-11-27 ENCOUNTER — Encounter: Payer: BC Managed Care – PPO | Admitting: Family Medicine

## 2019-11-27 DIAGNOSIS — G4733 Obstructive sleep apnea (adult) (pediatric): Secondary | ICD-10-CM

## 2019-11-27 DIAGNOSIS — Z8679 Personal history of other diseases of the circulatory system: Secondary | ICD-10-CM

## 2019-12-31 ENCOUNTER — Telehealth (INDEPENDENT_AMBULATORY_CARE_PROVIDER_SITE_OTHER): Payer: BC Managed Care – PPO | Admitting: Emergency Medicine

## 2019-12-31 ENCOUNTER — Other Ambulatory Visit: Payer: Self-pay

## 2019-12-31 VITALS — Temp 100.5°F

## 2019-12-31 DIAGNOSIS — R6889 Other general symptoms and signs: Secondary | ICD-10-CM | POA: Diagnosis not present

## 2019-12-31 DIAGNOSIS — R05 Cough: Secondary | ICD-10-CM

## 2019-12-31 DIAGNOSIS — J22 Unspecified acute lower respiratory infection: Secondary | ICD-10-CM | POA: Diagnosis not present

## 2019-12-31 DIAGNOSIS — R059 Cough, unspecified: Secondary | ICD-10-CM

## 2019-12-31 MED ORDER — DOXYCYCLINE HYCLATE 100 MG PO TABS: 100.0000 mg | ORAL_TABLET | Freq: Two times a day (BID) | ORAL | 0 refills | Status: AC

## 2019-12-31 MED ORDER — PREDNISONE 20 MG PO TABS: 40.0000 mg | ORAL_TABLET | Freq: Every day | ORAL | 0 refills | Status: AC

## 2019-12-31 NOTE — Progress Notes (Signed)
Telemedicine Encounter- SOAP NOTE Established Patient  This telephone encounter was conducted with the patient's (or proxy's) verbal consent via audio telecommunications: yes/no: Yes Patient was instructed to have this encounter in a suitably private space; and to only have persons present to whom they give permission to participate. In addition, patient identity was confirmed by use of name plus two identifiers (DOB and address).  I discussed the limitations, risks, security and privacy concerns of performing an evaluation and management service by telephone and the availability of in person appointments. I also discussed with the patient that there may be a patient responsible charge related to this service. The patient expressed understanding and agreed to proceed.  I spent a total of TIME; 0 MIN TO 60 MIN: 20 minutes talking with the patient or their proxy.  Chief Complaint  Patient presents with  . Cough    fatigue, cough, headache, stuffy nose, slight fever 100.5 highest, pt tested on sunday negative was exposed to 4 positive people this past week, pt did have the COVID vaccine in febuary     Subjective   Shelly [name] is a 44 y.o. female established patient. Telephone visit today complaining of flulike symptoms that started 1 week ago.  Went to urgent care center last Sunday and was tested for Covid twice.  Tested negative. Complaining of dull headache, stuffy nose and congestion, fever and chills, dry cough, and general malaise.  Chronic smoker. Diabetic with history of chronic A. fib as well on long-term anticoagulation. No other complaints or medical concerns today.  HPI   Patient Active Problem List   Diagnosis Date Noted  . New onset type 2 diabetes mellitus (HCC) 10/16/2019  . Chronic atrial fibrillation (HCC) 10/16/2019  . Current use of long term anticoagulation 10/16/2019  . History of asthma 01/01/2018  . Chronic seasonal allergic rhinitis 01/01/2018  .  Depression 09/07/2016  . 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 Smoking addiction 06/05/2014  . SARCOIDOSIS 04/05/2007    Past Medical History:  Diagnosis Date  . Allergy   . Anemia   . Anxiety   . Arthritis   . Depression   . Diabetes mellitus without complication (HCC)    Phreesia 12/31/2019  . GERD (gastroesophageal reflux disease)   . Migraines   . Miscarriage   . PFO (patent foramen ovale)   . Sarcoidosis   . Sleep apnea    Phreesia 12/31/2019  . Stroke (HCC)   . TIA (transient ischemic attack)     Current Outpatient Medications  Medication Sig Dispense Refill  . albuterol (PROVENTIL HFA;VENTOLIN HFA) 108 (90 Base) MCG/ACT inhaler Inhale 1-2 puffs into the lungs every 4 (four) hours as needed. 6.7 g 0  . Apixaban (ELIQUIS PO) Take 5 mg by mouth 2 (two) times daily.     Marland Kitchen aspirin (GOODSENSE ASPIRIN) 325 MG tablet Take 325 mg by mouth.    Marland Kitchen atorvastatin (LIPITOR) 20 MG tablet Take 1 tablet (20 mg total) by mouth daily. 90 tablet 3  . BREO ELLIPTA 200-25 MCG/INH AEPB INHALE 1 PUFF INTO THE LUNGS DAILY 60 each 11  . cetirizine (ZYRTEC) 10 MG tablet Take 10 mg by mouth daily.    Marland Kitchen diltiazem (CARDIZEM) 120 MG tablet Take 120 mg by mouth daily.    Marland Kitchen estradiol (ESTRACE) 2 MG tablet Take 2 mg by mouth daily.    . flecainide (TAMBOCOR) 100 MG tablet Take 100 mg by mouth  2 (two) times daily.    . fluticasone (FLONASE) 50 MCG/ACT nasal spray Place 2 sprays into both nostrils daily. 16 g 12  . metFORMIN (GLUCOPHAGE) 500 MG tablet Take 1 tablet (500 mg total) by mouth 2 (two) times daily with a meal. 180 tablet 3  . metoprolol tartrate (LOPRESSOR) 50 MG tablet Take 50 mg by mouth 2 (two) times daily.    . montelukast (SINGULAIR) 10 MG tablet TAKE 1 TABLET BY MOUTH EVERYDAY AT BEDTIME 90 tablet 2  . nicotine (NICODERM CQ - DOSED IN MG/24 HOURS) 14 mg/24hr patch PLACE 1 PATCH (14 MG TOTAL) ONTO THE SKIN DAILY.  28 patch 3  . prochlorperazine (COMPAZINE) 10 MG tablet Take 1 tablet (10 mg total) by mouth every 8 (eight) hours as needed for nausea or vomiting. 20 tablet 0  . venlafaxine XR (EFFEXOR-XR) 150 MG 24 hr capsule Take 150 mg by mouth daily with breakfast.    . venlafaxine XR (EFFEXOR-XR) 75 MG 24 hr capsule Take 75 mg by mouth.     No current facility-administered medications for this visit.    Allergies  Allergen Reactions  . Gramineae Pollens Hives  . Other Hives, Itching and Shortness Of Breath  . Clindamycin   . Clindamycin/Lincomycin   . Keflex [Cephalexin]   . Lincomycin Hcl   . Penicillins   . Sulfa Antibiotics Hives  . Sulfonamide Derivatives     Social History   Socioeconomic History  . Marital status: Married    Spouse name: Not on file  . Number of children: 1  . Years of education: Not on file  . Highest education level: Not on file  Occupational History  . Not on file  Tobacco Use  . Smoking status: Current Every Day Smoker    Packs/day: 1.00    Years: 26.00    Pack years: 26.00    Types: Cigarettes  . Smokeless tobacco: Never Used  Vaping Use  . Vaping Use: Never used  Substance and Sexual Activity  . Alcohol use: No    Alcohol/week: 0.0 standard drinks  . Drug use: No  . Sexual activity: Yes  Other Topics Concern  . Not on file  Social History Narrative  . Not on file   Social Determinants of Health   Financial Resource Strain:   . Difficulty of Paying Living Expenses: Not on file  Food Insecurity:   . Worried About Programme researcher, broadcasting/film/video in the Last Year: Not on file  . Ran Out of Food in the Last Year: Not on file  Transportation Needs:   . Lack of Transportation (Medical): Not on file  . Lack of Transportation (Non-Medical): Not on file  Physical Activity:   . Days of Exercise per Week: Not on file  . Minutes of Exercise per Session: Not on file  Stress:   . Feeling of Stress : Not on file  Social Connections:   . Frequency of  Communication with Friends and Family: Not on file  . Frequency of Social Gatherings with Friends and Family: Not on file  . Attends Religious Services: Not on file  . Active Member of Clubs or Organizations: Not on file  . Attends Banker Meetings: Not on file  . Marital Status: Not on file  Intimate Partner Violence:   . Fear of Current or Ex-Partner: Not on file  . Emotionally Abused: Not on file  . Physically Abused: Not on file  . Sexually Abused: Not on file  Review of Systems  Constitutional: Positive for chills, fever and malaise/fatigue.  HENT: Positive for congestion and sore throat.   Respiratory: Positive for cough. Negative for shortness of breath.   Cardiovascular: Negative for chest pain and palpitations.  Gastrointestinal: Negative for abdominal pain, nausea and vomiting.  Genitourinary: Negative.  Negative for dysuria and hematuria.  Musculoskeletal: Positive for myalgias.  Skin: Negative for rash.  Neurological: Positive for headaches. Negative for dizziness.  All other systems reviewed and are negative.   Objective  Alert and oriented x3 in no apparent respiratory distress Vitals as reported by the patient: Today's Vitals   12/31/19 1636  Temp: (!) 100.5 F (38.1 C)    There are no diagnoses linked to this encounter.  Clydia was seen today for cough.  Diagnoses and all orders for this visit:  Flu-like symptoms  Cough  Lower respiratory infection -     predniSONE (DELTASONE) 20 MG tablet; Take 2 tablets (40 mg total) by mouth daily with breakfast for 5 days. -     doxycycline (VIBRA-TABS) 100 MG tablet; Take 1 tablet (100 mg total) by mouth 2 (two) times daily for 7 days.    Clinically stable.  No red flag signs or symptoms.  ED precautions given.  Advised to contact the office if no better or worse in the next several days.  I discussed the assessment and treatment plan with the patient. The patient was provided an opportunity to  ask questions and all were answered. The patient agreed with the plan and demonstrated an understanding of the instructions.   The patient was advised to call back or seek an in-person evaluation if the symptoms worsen or if the condition fails to improve as anticipated.  I provided 20 minutes of non-face-to-face time during this encounter.  Georgina Quint, MD  [facility] at [facility]

## 2019-12-31 NOTE — Patient Instructions (Signed)
° ° ° °  If you have lab work done today you will be contacted with your lab results within the next 2 weeks.  If you have not heard from us then please contact us. The fastest way to get your results is to register for My Chart. ° ° °IF you received an x-ray today, you will receive an invoice from [facility]. Please contact [facility] at [phone] with questions or concerns regarding your invoice.  ° °IF you received labwork today, you will receive an invoice from [facility]. Please contact [facility] at [phone] with questions or concerns regarding your invoice.  ° °Our billing staff will not be able to assist you with questions regarding bills from these companies. ° °You will be contacted with the lab results as soon as they are available. The fastest way to get your results is to activate your My Chart account. Instructions are located on the last page of this paperwork. If you have not heard from us regarding the results in 2 weeks, please contact this office. °  ° ° ° °

## 2020-01-14 ENCOUNTER — Ambulatory Visit (INDEPENDENT_AMBULATORY_CARE_PROVIDER_SITE_OTHER): Payer: BC Managed Care – PPO | Admitting: Emergency Medicine

## 2020-01-14 ENCOUNTER — Other Ambulatory Visit: Payer: Self-pay

## 2020-01-14 DIAGNOSIS — E119 Type 2 diabetes mellitus without complications: Secondary | ICD-10-CM | POA: Diagnosis not present

## 2020-01-14 DIAGNOSIS — Z13220 Encounter for screening for lipoid disorders: Secondary | ICD-10-CM

## 2020-01-14 DIAGNOSIS — I482 Chronic atrial fibrillation, unspecified: Secondary | ICD-10-CM

## 2020-01-14 LAB — CMP14+EGFR
ALT: 47 IU/L — ABNORMAL HIGH (ref 0–32)
AST: 25 IU/L (ref 0–40)
Albumin/Globulin Ratio: 2.1 (ref 1.2–2.2)
Albumin: 4.7 g/dL (ref 3.8–4.8)
Alkaline Phosphatase: 121 IU/L (ref 44–121)
BUN/Creatinine Ratio: 23 (ref 9–23)
BUN: 17 mg/dL (ref 6–24)
Bilirubin Total: 0.4 mg/dL (ref 0.0–1.2)
CO2: 25 mmol/L (ref 20–29)
Calcium: 10.1 mg/dL (ref 8.7–10.2)
Chloride: 100 mmol/L (ref 96–106)
Creatinine, Ser: 0.75 mg/dL (ref 0.57–1.00)
GFR calc Af Amer: 111 mL/min/{1.73_m2} (ref >59)
GFR calc non Af Amer: 96 mL/min/{1.73_m2} (ref >59)
Globulin, Total: 2.2 g/dL (ref 1.5–4.5)
Glucose: 138 mg/dL — ABNORMAL HIGH (ref 65–99)
Potassium: 4.3 mmol/L (ref 3.5–5.2)
Sodium: 140 mmol/L (ref 134–144)
Total Protein: 6.9 g/dL (ref 6.0–8.5)

## 2020-01-14 LAB — CBC WITH DIFFERENTIAL/PLATELET
Basophils Absolute: 0 10*3/uL (ref 0.0–0.2)
Basos: 0 %
EOS (ABSOLUTE): 0 10*3/uL (ref 0.0–0.4)
Eos: 0 %
Hematocrit: 43.6 % (ref 34.0–46.6)
Hemoglobin: 14.7 g/dL (ref 11.1–15.9)
Immature Grans (Abs): 0 10*3/uL (ref 0.0–0.1)
Immature Granulocytes: 0 %
Lymphocytes Absolute: 2.8 10*3/uL (ref 0.7–3.1)
Lymphs: 26 %
MCH: 29.5 pg (ref 26.6–33.0)
MCHC: 33.7 g/dL (ref 31.5–35.7)
MCV: 88 fL (ref 79–97)
Monocytes Absolute: 0.5 10*3/uL (ref 0.1–0.9)
Monocytes: 4 %
Neutrophils Absolute: 7.4 10*3/uL — ABNORMAL HIGH (ref 1.4–7.0)
Neutrophils: 70 %
Platelets: 322 10*3/uL (ref 150–450)
RBC: 4.98 x10E6/uL (ref 3.77–5.28)
RDW: 12.6 % (ref 11.7–15.4)
WBC: 10.7 10*3/uL (ref 3.4–10.8)

## 2020-01-14 LAB — LIPID PANEL
Chol/HDL Ratio: 4.9 ratio — ABNORMAL HIGH (ref 0.0–4.4)
Cholesterol, Total: 151 mg/dL (ref 100–199)
HDL: 31 mg/dL — ABNORMAL LOW (ref >39)
LDL Chol Calc (NIH): 103 mg/dL — ABNORMAL HIGH (ref 0–99)
Triglycerides: 92 mg/dL (ref 0–149)
VLDL Cholesterol Cal: 17 mg/dL (ref 5–40)

## 2020-01-14 LAB — HEMOGLOBIN A1C
Est. average glucose Bld gHb Est-mCnc: 154 mg/dL
Hgb A1c MFr Bld: 7 % — ABNORMAL HIGH (ref 4.8–5.6)

## 2020-01-19 ENCOUNTER — Encounter: Payer: Self-pay | Admitting: Emergency Medicine

## 2020-01-19 ENCOUNTER — Ambulatory Visit (INDEPENDENT_AMBULATORY_CARE_PROVIDER_SITE_OTHER): Payer: BC Managed Care – PPO | Admitting: Emergency Medicine

## 2020-01-19 ENCOUNTER — Other Ambulatory Visit: Payer: Self-pay

## 2020-01-19 VITALS — BP 111/77 | HR 120 | Temp 97.2°F | Resp 18 | Ht 64.0 in | Wt 195.4 lb

## 2020-01-19 DIAGNOSIS — Z23 Encounter for immunization: Secondary | ICD-10-CM

## 2020-01-19 DIAGNOSIS — E119 Type 2 diabetes mellitus without complications: Secondary | ICD-10-CM

## 2020-01-19 DIAGNOSIS — F17209 Nicotine dependence, unspecified, with unspecified nicotine-induced disorders: Secondary | ICD-10-CM

## 2020-01-19 DIAGNOSIS — D860 Sarcoidosis of lung: Secondary | ICD-10-CM

## 2020-01-19 DIAGNOSIS — Z8679 Personal history of other diseases of the circulatory system: Secondary | ICD-10-CM

## 2020-01-19 DIAGNOSIS — Z0001 Encounter for general adult medical examination with abnormal findings: Secondary | ICD-10-CM | POA: Diagnosis not present

## 2020-01-19 DIAGNOSIS — Z6833 Body mass index (BMI) 33.0-33.9, adult: Secondary | ICD-10-CM

## 2020-01-19 DIAGNOSIS — Z13220 Encounter for screening for lipoid disorders: Secondary | ICD-10-CM

## 2020-01-19 DIAGNOSIS — Z716 Tobacco abuse counseling: Secondary | ICD-10-CM

## 2020-01-19 DIAGNOSIS — I482 Chronic atrial fibrillation, unspecified: Secondary | ICD-10-CM

## 2020-01-19 NOTE — Patient Instructions (Addendum)
   If you have lab work done today you will be contacted with your lab results within the next 2 weeks.  If you have not heard from us then please contact us. The fastest way to get your results is to register for My Chart.   IF you received an x-ray today, you will receive an invoice from [facility]. Please contact [facility] at [phone] with questions or concerns regarding your invoice.   IF you received labwork today, you will receive an invoice from [facility]. Please contact [facility] at [phone] with questions or concerns regarding your invoice.   Our billing staff will not be able to assist you with questions regarding bills from these companies.  You will be contacted with the lab results as soon as they are available. The fastest way to get your results is to activate your My Chart account. Instructions are located on the last page of this paperwork. If you have not heard from us regarding the results in 2 weeks, please contact this office.       Health Maintenance, Female Adopting a healthy lifestyle and getting preventive care are important in promoting health and wellness. Ask your health care provider about:  The right schedule for you to have regular tests and exams.  Things you can do on your own to prevent diseases and keep yourself healthy. What should I know about diet, weight, and exercise? Eat a healthy diet   Eat a diet that includes plenty of vegetables, fruits, low-fat dairy products, and lean protein.  Do not eat a lot of foods that are high in solid fats, added sugars, or sodium. Maintain a healthy weight Body mass index (BMI) is used to identify weight problems. It estimates body fat based on height and weight. Your health care provider can help determine your BMI and help you achieve or maintain a healthy weight. Get regular exercise Get regular exercise. This is one of the most important things you can do for your health. Most  adults should:  Exercise for at least 150 minutes each week. The exercise should increase your heart rate and make you sweat (moderate-intensity exercise).  Do strengthening exercises at least twice a week. This is in addition to the moderate-intensity exercise.  Spend less time sitting. Even light physical activity can be beneficial. Watch cholesterol and blood lipids Have your blood tested for lipids and cholesterol at 46 years of age, then have this test every 5 years. Have your cholesterol levels checked more often if:  Your lipid or cholesterol levels are high.  You are older than 46 years of age.  You are at high risk for heart disease. What should I know about cancer screening? Depending on your health history and family history, you may need to have cancer screening at various ages. This may include screening for:  Breast cancer.  Cervical cancer.  Colorectal cancer.  Skin cancer.  Lung cancer. What should I know about heart disease, diabetes, and high blood pressure? Blood pressure and heart disease  High blood pressure causes heart disease and increases the risk of stroke. This is more likely to develop in people who have high blood pressure readings, are of African descent, or are overweight.  Have your blood pressure checked: ? Every 3-5 years if you are 18-39 years of age. ? Every year if you are 40 years old or older. Diabetes Have regular diabetes screenings. This checks your fasting blood sugar level. Have the screening done:  Once   every three years after age 40 if you are at a normal weight and have a low risk for diabetes.  More often and at a younger age if you are overweight or have a high risk for diabetes. What should I know about preventing infection? Hepatitis B If you have a higher risk for hepatitis B, you should be screened for this virus. Talk with your health care provider to find out if you are at risk for hepatitis B infection. Hepatitis  C Testing is recommended for:  Everyone born from 1945 through 1965.  Anyone with known risk factors for hepatitis C. Sexually transmitted infections (STIs)  Get screened for STIs, including gonorrhea and chlamydia, if: ? You are sexually active and are younger than 46 years of age. ? You are older than 46 years of age and your health care provider tells you that you are at risk for this type of infection. ? Your sexual activity has changed since you were last screened, and you are at increased risk for chlamydia or gonorrhea. Ask your health care provider if you are at risk.  Ask your health care provider about whether you are at high risk for HIV. Your health care provider may recommend a prescription medicine to help prevent HIV infection. If you choose to take medicine to prevent HIV, you should first get tested for HIV. You should then be tested every 3 months for as long as you are taking the medicine. Pregnancy  If you are about to stop having your period (premenopausal) and you may become pregnant, seek counseling before you get pregnant.  Take 400 to 800 micrograms (mcg) of folic acid every day if you become pregnant.  Ask for birth control (contraception) if you want to prevent pregnancy. Osteoporosis and menopause Osteoporosis is a disease in which the bones lose minerals and strength with aging. This can result in bone fractures. If you are 65 years old or older, or if you are at risk for osteoporosis and fractures, ask your health care provider if you should:  Be screened for bone loss.  Take a calcium or vitamin D supplement to lower your risk of fractures.  Be given hormone replacement therapy (HRT) to treat symptoms of menopause. Follow these instructions at home: Lifestyle  Do not use any products that contain nicotine or tobacco, such as cigarettes, e-cigarettes, and chewing tobacco. If you need help quitting, ask your health care provider.  Do not use street  drugs.  Do not share needles.  Ask your health care provider for help if you need support or information about quitting drugs. Alcohol use  Do not drink alcohol if: ? Your health care provider tells you not to drink. ? You are pregnant, may be pregnant, or are planning to become pregnant.  If you drink alcohol: ? Limit how much you use to 0-1 drink a day. ? Limit intake if you are breastfeeding.  Be aware of how much alcohol is in your drink. In the [place] one drink equals one 12 oz bottle of beer (355 mL), one 5 oz glass of wine (148 mL), or one 1 oz glass of hard liquor (44 mL). General instructions  Schedule regular health, dental, and eye exams.  Stay current with your vaccines.  Tell your health care provider if: ? You often feel depressed. ? You have ever been abused or do not feel safe at home. Summary  Adopting a healthy lifestyle and getting preventive care are important in promoting health and   wellness.  Follow your health care provider's instructions about healthy diet, exercising, and getting tested or screened for diseases.  Follow your health care provider's instructions on monitoring your cholesterol and blood pressure. This information is not intended to replace advice given to you by your health care provider. Make sure you discuss any questions you have with your health care provider. Document Revised: 03/27/2018 Document Reviewed: 03/27/2018 Elsevier Patient Education  2020 Elsevier Inc.  

## 2020-01-19 NOTE — Progress Notes (Signed)
Shelly [name] 44 y.o.   Chief Complaint  Patient presents with  . Annual Exam    Patient states she is here for an CPE. Patient has no other questions or concerns.    HISTORY OF PRESENT ILLNESS: This is a 44 y.o. female here for annual exam. #1 history of diabetes, on Metformin. 2.  Chronic atrial fibrillation: On Eliquis and aspirin.  On Tambocor, diltiazem, and Lopressor. 3.  Smoker/asthma: Uses albuterol as needed.  Daily Breo Ellipta 4.  Dyslipidemia: On atorvastatin 20 mg daily. 5.  Chronic seasonal allergic rhinitis Recent blood work results reviewed and discussed with patient.  Hemoglobin A1c down to 7 from 8.  Improved lipid profile. Normal CBC and CMP. No complaints or medical concerns today.   HPI   Prior to Admission medications   Medication Sig Start Date End Date Taking? Authorizing Provider  albuterol (PROVENTIL HFA;VENTOLIN HFA) 108 (90 Base) MCG/ACT inhaler Inhale 1-2 puffs into the lungs every 4 (four) hours as needed. 07/16/17  Yes Barnett Abu, [place] D, PA-C  aspirin (GOODSENSE ASPIRIN) 325 MG tablet Take 325 mg by mouth.   Yes [provider]  atorvastatin (LIPITOR) 20 MG tablet Take 1 tablet (20 mg total) by mouth daily. 10/21/19 01/19/20 Yes Severo Beber, Eilleen Kempf, MD  BREO ELLIPTA [phone] MCG/INH AEPB INHALE 1 PUFF INTO THE LUNGS DAILY 01/09/19  Yes Azuree Minish, Eilleen Kempf, MD  cetirizine (ZYRTEC) 10 MG tablet Take 10 mg by mouth daily.   Yes [provider]  diltiazem (CARDIZEM) 120 MG tablet Take 120 mg by mouth daily.   Yes [provider]  flecainide (TAMBOCOR) 100 MG tablet Take 100 mg by mouth 2 (two) times daily.   Yes [provider]  fluticasone (FLONASE) 50 MCG/ACT nasal spray Place 2 sprays into both nostrils daily. 01/01/18  Yes Sarea Fyfe, Eilleen Kempf, MD  metFORMIN (GLUCOPHAGE) 500 MG tablet Take 1 tablet (500 mg total) by mouth 2 (two) times daily with a meal. 10/16/19  Yes Tosha Belgarde, Eilleen Kempf, MD  metoprolol tartrate  (LOPRESSOR) 50 MG tablet Take 50 mg by mouth 2 (two) times daily.   Yes [provider]  montelukast (SINGULAIR) 10 MG tablet TAKE 1 TABLET BY MOUTH EVERYDAY AT BEDTIME 07/04/19  Yes Jasha Hodzic, Eilleen Kempf, MD  nicotine (NICODERM CQ - DOSED IN MG/24 HOURS) 14 mg/24hr patch PLACE 1 PATCH (14 MG TOTAL) ONTO THE SKIN DAILY. 02/03/19  Yes Georgina Quint, MD  venlafaxine XR (EFFEXOR-XR) 150 MG 24 hr capsule Take 150 mg by mouth daily with breakfast.   Yes [provider]  venlafaxine XR (EFFEXOR-XR) 75 MG 24 hr capsule Take 75 mg by mouth. 08/22/16  Yes [provider]  Apixaban (ELIQUIS PO) Take 5 mg by mouth 2 (two) times daily.  Patient not taking: Reported on 01/19/2020    [provider]  estradiol (ESTRACE) 2 MG tablet Take 2 mg by mouth daily. Patient not taking: Reported on 01/19/2020    [provider]  prochlorperazine (COMPAZINE) 10 MG tablet Take 1 tablet (10 mg total) by mouth every 8 (eight) hours as needed for nausea or vomiting. Patient not taking: Reported on 01/19/2020 05/20/18   Myles Lipps, MD    Allergies  Allergen Reactions  . Gramineae Pollens Hives  . Other Hives, Itching and Shortness Of Breath  . Clindamycin   . Clindamycin/Lincomycin   . Keflex [Cephalexin]   . Lincomycin Hcl   . Penicillins   . Sulfa Antibiotics Hives  . Sulfonamide Derivatives  Patient Active Problem List   Diagnosis Date Noted  . New onset type 2 diabetes mellitus (HCC) 10/16/2019  . Chronic atrial fibrillation (HCC) 10/16/2019  . Current use of long term anticoagulation 10/16/2019  . History of asthma 01/01/2018  . Chronic seasonal allergic rhinitis 01/01/2018  . Depression 09/07/2016  . 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 Smoking addiction 06/05/2014  . SARCOIDOSIS 04/05/2007    Past Medical History:  Diagnosis Date  . Allergy   . Anemia   . Anxiety     . Arthritis   . Depression   . Diabetes mellitus without complication (HCC)    [facility]reesia 12/31/2019  . GERD (gastroesophageal reflux disease)   . Migraines   . Miscarriage   . PFO (patent foramen ovale)   . Sarcoidosis   . Sleep apnea    [facility]reesia 12/31/2019  . Stroke (HCC)   . TIA (transient ischemic attack)     Past Surgical History:  Procedure Laterality Date  . ABDOMINAL HYSTERECTOMY N/A    [facility]reesia 12/31/2019  . BRONCHOSCOPY    . CESAREAN SECTION     miscarriage  . CHOLECYSTECTOMY    . HAND SURGERY    . miscarriage  08/10/14  . NASAL SINUS SURGERY      Social History   Socioeconomic History  . Marital status: Married    Spouse name: Not on file  . Number of children: 1  . Years of education: Not on file  . Highest education level: Not on file  Occupational History  . Not on file  Tobacco Use  . Smoking status: Current Every Day Smoker    Packs/day: 1.00    Years: 26.00    Pack years: 26.00    Types: Cigarettes  . Smokeless tobacco: Never Used  Vaping Use  . Vaping Use: Never used  Substance and Sexual Activity  . Alcohol use: No    Alcohol/week: 0.0 standard drinks  . Drug use: No  . Sexual activity: Yes  Other Topics Concern  . Not on file  Social History Narrative  . Not on file   Social Determinants of Health   Financial Resource Strain:   . Difficulty of Paying Living Expenses: Not on file  Food Insecurity:   . Worried About Programme researcher, broadcasting/film/videounning Out of Food in the Last Year: Not on file  . Ran Out of Food in the Last Year: Not on file  Transportation Needs:   . Lack of Transportation (Medical): Not on file  . Lack of Transportation (Non-Medical): Not on file  Physical Activity:   . Days of Exercise per Week: Not on file  . Minutes of Exercise per Session: Not on file  Stress:   . Feeling of Stress : Not on file  Social Connections:   . Frequency of Communication with Friends and Family: Not on file  . Frequency of Social Gatherings with Friends  and Family: Not on file  . Attends Religious Services: Not on file  . Active Member of Clubs or Organizations: Not on file  . Attends BankerClub or Organization Meetings: Not on file  . Marital Status: Not on file  Intimate Partner Violence:   . Fear of Current or Ex-Partner: Not on file  . Emotionally Abused: Not on file  . Physically Abused: Not on file  . Sexually Abused: Not on file    Family History  Problem Relation Age of Onset  . Osteoporosis Mother   .  Asthma Mother   . Diabetes Father   . Heart disease Father   . Kidney disease Maternal Grandmother   . Heart disease Maternal Grandfather   . Cancer Paternal Grandmother   . Heart disease Paternal Grandfather      Review of Systems  Constitutional: Negative.  Negative for chills and fever.  HENT: Negative.  Negative for congestion and sore throat.   Respiratory: Negative.  Negative for cough and shortness of breath.   Cardiovascular: Negative.  Negative for chest pain and palpitations.  Gastrointestinal: Negative.  Negative for abdominal pain, diarrhea, nausea and vomiting.  Genitourinary: Negative.  Negative for dysuria and hematuria.  Musculoskeletal: Negative.  Negative for myalgias.  Skin: Negative.  Negative for rash.  Neurological: Negative.  Negative for dizziness and headaches.  Endo/Heme/Allergies: Negative.   All other systems reviewed and are negative.  Today's Vitals   01/19/20 0956  BP: 111/77  Pulse: (!) 120  Resp: 18  Temp: (!) 97.2 F (36.2 C)  TempSrc: Temporal  SpO2: 97%  Weight: 195 lb 6.4 oz (88.6 kg)  Height: 5\' 4"  (1.626 m)   Body mass index is 33.54 kg/m. Wt Readings from Last 3 Encounters:  01/19/20 195 lb 6.4 oz (88.6 kg)  10/16/19 204 lb (92.5 kg)  08/26/19 210 lb (95.3 kg)     Physical Exam Vitals reviewed.  Constitutional:      Appearance: Normal appearance.  HENT:     Head: Normocephalic.     Right Ear: Tympanic membrane, ear canal and external ear normal.     Left Ear:  Tympanic membrane, ear canal and external ear normal.     Mouth/Throat:     Mouth: Mucous membranes are moist.     Pharynx: Oropharynx is clear.  Eyes:     Extraocular Movements: Extraocular movements intact.     Conjunctiva/sclera: Conjunctivae normal.     Pupils: Pupils are equal, round, and reactive to light.  Cardiovascular:     Rate and Rhythm: Tachycardia present. Rhythm irregular.     Pulses: Normal pulses.     Heart sounds: Normal heart sounds.  Pulmonary:     Effort: Pulmonary effort is normal.     Breath sounds: Normal breath sounds.  Abdominal:     General: Bowel sounds are normal. There is no distension.     Palpations: Abdomen is soft. There is no mass.     Tenderness: There is no abdominal tenderness. There is no guarding.  Musculoskeletal:        General: Normal range of motion.     Right lower leg: No edema.     Left lower leg: No edema.  Skin:    General: Skin is warm and dry.     Capillary Refill: Capillary refill takes less than 2 seconds.  Neurological:     General: No focal deficit present.     Mental Status: She is alert and oriented to person, place, and time.  Psychiatric:        Mood and Affect: Mood normal.        Behavior: Behavior normal.      ASSESSMENT & PLAN: Edla was seen today for annual exam.  Diagnoses and all orders for this visit:  Encounter for general adult medical examination with abnormal findings  History of atrial fibrillation  New onset type 2 diabetes mellitus (HCC) -     Microalbumin, urine -     Ambulatory referral to Ophthalmology  Chronic atrial fibrillation (HCC)  Screening for hyperlipidemia  Need for prophylactic vaccination and inoculation against influenza -     Flu Vaccine QUAD 6+ mos PF IM (Fluarix Quad PF)  Body mass index (BMI) of 33.0-33.9 in adult -     Amb ref to Medical Nutrition Therapy-MNT  Sarcoidosis of lung (HCC)  Tobacco use disorder, continuous  Tobacco abuse counseling    Patient  Instructions       If you have lab work done today you will be contacted with your lab results within the next 2 weeks.  If you have not heard from [place] then please contact us. The fastest way to get your results is to register for My Chart.   IF you received an x-ray today, you will receive an invoice from [facility]. Please contact [facility] at [phone] with questions or concerns regarding your invoice.   IF you received labwork today, you will receive an invoice from [facility]. Please contact [facility] at [phone] with questions or concerns regarding your invoice.   Our billing staff will not be able to assist you with questions regarding bills from these companies.  You will be contacted with the lab results as soon as they are available. The fastest way to get your results is to activate your My Chart account. Instructions are located on the last page of this paperwork. If you have not heard from [place] regarding the results in 2 weeks, please contact this office.      Health Maintenance, Female Adopting a healthy lifestyle and getting preventive care are important in promoting health and wellness. Ask your health care provider about:  The right schedule for you to have regular tests and exams.  Things you can do on your own to prevent diseases and keep yourself healthy. What should I know about diet, weight, and exercise? Eat a healthy diet   Eat a diet that includes plenty of vegetables, fruits, low-fat dairy products, and lean protein.  Do not eat a lot of foods that are high in solid fats, added sugars, or sodium. Maintain a healthy weight Body mass index (BMI) is used to identify weight problems. It estimates body fat based on height and weight. Your health care provider can help determine your BMI and help you achieve or maintain a healthy weight. Get regular exercise Get regular exercise. This is one of the most important things you can do for your  health. Most adults should:  Exercise for at least 150 minutes each week. The exercise should increase your heart rate and make you sweat (moderate-intensity exercise).  Do strengthening exercises at least twice a week. This is in addition to the moderate-intensity exercise.  Spend less time sitting. Even light physical activity can be beneficial. Watch cholesterol and blood lipids Have your blood tested for lipids and cholesterol at 46 years of age, then have this test every 5 years. Have your cholesterol levels checked more often if:  Your lipid or cholesterol levels are high.  You are older than 46 years of age.  You are at high risk for heart disease. What should I know about cancer screening? Depending on your health history and family history, you may need to have cancer screening at various ages. This may include screening for:  Breast cancer.  Cervical cancer.  Colorectal cancer.  Skin cancer.  Lung cancer. What should I know about heart disease, diabetes, and high blood pressure? Blood pressure and heart disease  High blood pressure causes heart disease and increases the risk of stroke. This is  more likely to develop in people who have high blood pressure readings, are of African descent, or are overweight.  Have your blood pressure checked: ? Every 3-5 years if you are 3-69 years of age. ? Every year if you are 62 years old or older. Diabetes Have regular diabetes screenings. This checks your fasting blood sugar level. Have the screening done:  Once every three years after age 31 if you are at a normal weight and have a low risk for diabetes.  More often and at a younger age if you are overweight or have a high risk for diabetes. What should I know about preventing infection? Hepatitis B If you have a higher risk for hepatitis B, you should be screened for this virus. Talk with your health care provider to find out if you are at risk for hepatitis B  infection. Hepatitis C Testing is recommended for:  Everyone born from [birth year] through 1967.  Anyone with known risk factors for hepatitis C. Sexually transmitted infections (STIs)  Get screened for STIs, including gonorrhea and chlamydia, if: ? You are sexually active and are younger than 47 years of age. ? You are older than 46 years of age and your health care provider tells you that you are at risk for this type of infection. ? Your sexual activity has changed since you were last screened, and you are at increased risk for chlamydia or gonorrhea. Ask your health care provider if you are at risk.  Ask your health care provider about whether you are at high risk for HIV. Your health care provider may recommend a prescription medicine to help prevent HIV infection. If you choose to take medicine to prevent HIV, you should first get tested for HIV. You should then be tested every 3 months for as long as you are taking the medicine. Pregnancy  If you are about to stop having your period (premenopausal) and you may become pregnant, seek counseling before you get pregnant.  Take 400 to 800 micrograms (mcg) of folic acid every day if you become pregnant.  Ask for birth control (contraception) if you want to prevent pregnancy. Osteoporosis and menopause Osteoporosis is a disease in which the bones lose minerals and strength with aging. This can result in bone fractures. If you are 50 years old or older, or if you are at risk for osteoporosis and fractures, ask your health care provider if you should:  Be screened for bone loss.  Take a calcium or vitamin D supplement to lower your risk of fractures.  Be given hormone replacement therapy (HRT) to treat symptoms of menopause. Follow these instructions at home: Lifestyle  Do not use any products that contain nicotine or tobacco, such as cigarettes, e-cigarettes, and chewing tobacco. If you need help quitting, ask your health care  provider.  Do not use street drugs.  Do not share needles.  Ask your health care provider for help if you need support or information about quitting drugs. Alcohol use  Do not drink alcohol if: ? Your health care provider tells you not to drink. ? You are pregnant, may be pregnant, or are planning to become pregnant.  If you drink alcohol: ? Limit how much you use to 0-1 drink a day. ? Limit intake if you are breastfeeding.  Be aware of how much alcohol is in your drink. In the [place] one drink equals one 12 oz bottle of beer (355 mL), one 5 oz glass of wine (148 mL), or one  1 oz glass of hard liquor (44 mL). General instructions  Schedule regular health, dental, and eye exams.  Stay current with your vaccines.  Tell your health care provider if: ? You often feel depressed. ? You have ever been abused or do not feel safe at home. Summary  Adopting a healthy lifestyle and getting preventive care are important in promoting health and wellness.  Follow your health care provider's instructions about healthy diet, exercising, and getting tested or screened for diseases.  Follow your health care provider's instructions on monitoring your cholesterol and blood pressure. This information is not intended to replace advice given to you by your health care provider. Make sure you discuss any questions you have with your health care provider. Document Revised: 03/27/2018 Document Reviewed: 03/27/2018 Elsevier Patient Education  2020 Elsevier Inc.      Edwina Barth, MD [facility]

## 2020-01-20 LAB — MICROALBUMIN, URINE: Microalbumin, Urine: 53.9 ug/mL

## 2020-02-27 ENCOUNTER — Telehealth: Payer: Self-pay | Admitting: Emergency Medicine

## 2020-02-27 NOTE — Telephone Encounter (Signed)
Received a fax from after hours stating pt was stung by a bee on Tuesday on her finger and now her hand is starting to swell. Tried calling pt but VM is full. Will try again latter. Please advise.

## 2020-03-02 ENCOUNTER — Encounter: Payer: Self-pay | Admitting: Dietician

## 2020-03-02 ENCOUNTER — Other Ambulatory Visit: Payer: Self-pay

## 2020-03-02 ENCOUNTER — Encounter: Payer: BC Managed Care – PPO | Attending: Emergency Medicine | Admitting: Dietician

## 2020-03-02 DIAGNOSIS — E119 Type 2 diabetes mellitus without complications: Secondary | ICD-10-CM | POA: Insufficient documentation

## 2020-03-02 NOTE — Progress Notes (Signed)
Diabetes Self-Management Education  Visit Type: First/Initial  Appt. Start Time: 2:10pm   Appt. End Time: 3:30pm  03/02/2020  Shelly [name], identified by name and date of birth, is a 44 y.o. female with a diagnosis of Diabetes: Type 2.   ASSESSMENT  States she found out she has diabetes in July and was prescribed medication immediately, which helped improve her A1c from 8.0% to 7.0%. Patient states she has also made changes to her diet, such as avoid bread and overall eating a lot less food because she wasn't sure what she "can and can't eat." Patient is a current smoker, states she is working on this. Typical meal pattern is 2-3 meals per day plus snacks. Tries to avoid hamburger meat for heart health concerns, and overall states she doesn't like many vegetables. States she is a picky eater but willing to try new foods.    Diabetes Self-Management Education - 03/02/20 1503      Visit Information   Visit Type First/Initial      Initial Visit   Diabetes Type Type 2    Are you currently following a meal plan? No    Are you taking your medications as prescribed? Yes    Date Diagnosed July 2021      Health Coping   How would you rate your overall health? Good      Psychosocial Assessment   Patient Belief/Attitude about Diabetes Afraid    Self-care barriers None    Patient Concerns Nutrition/Meal planning;Monitoring;Glycemic Control;Weight Control    Special Needs None    Preferred Learning Style Visual    Learning Readiness Ready    How often do you need to have someone help you when you read instructions, pamphlets, or other written materials from your doctor or pharmacy? 1 - Never      Complications   Last HgB A1C per patient/outside source 7 %   per Epic 01/14/20   How often do you check your blood sugar? 0 times/day (not testing)   pt bought a meter and would like to check   Have you had a dilated eye exam in the past 12 months? Yes    Have you had a dental exam in the  past 12 months? Yes    Are you checking your feet? Yes      Dietary Intake   Breakfast bagel   or biscuit; or hard boiled eggs   Lunch salad w/ deli meat & cheese & croutons + [employer] [place] dressing   or mac & cheese; or pizza; or school lunch (works as a Pharmacist, hospital)   Snack (afternoon) cheese sticks   or nuts; or popcorn; or candy   Dinner spaghetti    Beverage(s) water, unsweet tea w/ Splenda      Exercise   Exercise Type ADL's;Light (walking / raking leaves)    How many days per week to you exercise? 3    How many minutes per day do you exercise? 15    Total minutes per week of exercise 45      Patient Education   Previous Diabetes Education No    Disease state  Definition of diabetes, type 1 and 2, and the diagnosis of diabetes;Explored patient's options for treatment of their diabetes;Factors that contribute to the development of diabetes    Nutrition management  Role of diet in the treatment of diabetes and the relationship between the three main macronutrients and blood glucose level;Effects of alcohol on blood glucose and safety factors  with consumption of alcohol.;Information on hints to eating out and maintain blood glucose control.;Meal options for control of blood glucose level and chronic complications.    Medications Reviewed patients medication for diabetes, action, purpose, timing of dose and side effects.   discussed metformin   Monitoring Taught/evaluated SMBG meter.;Purpose and frequency of SMBG.;Taught/discussed recording of test results and interpretation of SMBG.;Identified appropriate SMBG and/or A1C goals.    Acute complications Discussed and identified patients' treatment of hyperglycemia.;Taught treatment of hypoglycemia - the 15 rule.    Chronic complications Relationship between chronic complications and blood glucose control;Lipid levels, blood glucose control and heart disease;Retinopathy and reason for yearly dilated eye exams    Psychosocial adjustment  Worked with patient to identify barriers to care and solutions;Role of stress on diabetes;Identified and addressed patients feelings and concerns about diabetes    Personal strategies to promote health Review risk of smoking and offered smoking cessation      Individualized Goals (developed by patient)   Nutrition General guidelines for healthy choices and portions discussed    Medications take my medication as prescribed    Monitoring  test my blood glucose as discussed   fasting and 2 hrs after a meal     Outcomes   Expected Outcomes Demonstrated interest in learning. Expect positive outcomes    Future DMSE 4-6 wks    Program Status Not Completed           Individualized Plan for Diabetes Self-Management Training:   Learning Objective:  Patient will have a greater understanding of diabetes self-management. Patient education plan is to attend individual and/or group sessions per assessed needs and concerns.   Plan:   Patient Instructions   Aim to drink lots of fluids every day, especially water. The goal is 64 ounces or more per day.   Try to include 3 things in your lunch/dinner meals: non-starchy vegetables, complex carbohydrates, and protein. See "Meal Ideas" handout for good sources of foods that fit into these 3 groups.   Remember that fiber (found in plant foods, including vegetables, fruit, beans, whole grains, etc.) and protein also help with weight management in addition to blood sugar management.    Expected Outcomes:  Demonstrated interest in learning. Expect positive outcomes  Education material provided: ADA - How to Thrive: A Guide for Your Journey with Diabetes, Meal Ideas  If problems or questions, patient to contact team via:  Phone and Email  Future DSME appointment: 4-6 wks

## 2020-03-02 NOTE — Patient Instructions (Signed)
·   Aim to drink lots of fluids every day, especially water. The goal is 64 ounces or more per day.   Try to include 3 things in your lunch/dinner meals: non-starchy vegetables, complex carbohydrates, and protein. See "Meal Ideas" handout for good sources of foods that fit into these 3 groups.   Remember that fiber (found in plant foods, including vegetables, fruit, beans, whole grains, etc.) and protein also help with weight management in addition to blood sugar management.

## 2020-03-15 ENCOUNTER — Ambulatory Visit: Payer: BC Managed Care – PPO | Admitting: Emergency Medicine

## 2020-03-21 ENCOUNTER — Other Ambulatory Visit: Payer: Self-pay | Admitting: Emergency Medicine

## 2020-03-21 NOTE — Telephone Encounter (Signed)
Requested Prescriptions  Pending Prescriptions Disp Refills  . montelukast (SINGULAIR) 10 MG tablet [Pharmacy Med Name: MONTELUKAST SOD 10 MG TABLET] 90 tablet 2    Sig: TAKE 1 TABLET BY MOUTH EVERYDAY AT BEDTIME     Pulmonology:  Leukotriene Inhibitors Passed - 03/21/2020  9:41 AM      Passed - Valid encounter within last 12 months    Recent Outpatient Visits          2 months ago Encounter for general adult medical examination with abnormal findings   Primary Care at [facility], [place], MD   2 months ago New onset type 2 diabetes mellitus [facility])   Primary Care at [facility], Eilleen Kempf, MD   2 months ago Flu-like symptoms   Primary Care at [place], [place], MD   5 months ago Chronic atrial fibrillation [facility])   Primary Care at [facility], Eilleen Kempf, MD   8 months ago Cough   Primary Care at [facility], Eilleen Kempf, MD      Future Appointments            In 3 weeks Sagardia, Eilleen Kempf, MD Primary Care at [place], [facility]

## 2020-04-07 ENCOUNTER — Ambulatory Visit: Payer: BC Managed Care – PPO | Admitting: Dietician

## 2020-04-14 ENCOUNTER — Encounter: Payer: Self-pay | Admitting: Emergency Medicine

## 2020-04-14 ENCOUNTER — Other Ambulatory Visit: Payer: Self-pay

## 2020-04-14 ENCOUNTER — Ambulatory Visit (INDEPENDENT_AMBULATORY_CARE_PROVIDER_SITE_OTHER): Payer: BC Managed Care – PPO | Admitting: Emergency Medicine

## 2020-04-14 VITALS — BP 119/80 | HR 70 | Temp 95.0°F | Resp 16 | Ht 64.0 in | Wt 192.0 lb

## 2020-04-14 DIAGNOSIS — E1165 Type 2 diabetes mellitus with hyperglycemia: Secondary | ICD-10-CM

## 2020-04-14 DIAGNOSIS — D860 Sarcoidosis of lung: Secondary | ICD-10-CM

## 2020-04-14 DIAGNOSIS — I482 Chronic atrial fibrillation, unspecified: Secondary | ICD-10-CM | POA: Diagnosis not present

## 2020-04-14 DIAGNOSIS — R0981 Nasal congestion: Secondary | ICD-10-CM

## 2020-04-14 DIAGNOSIS — Z7901 Long term (current) use of anticoagulants: Secondary | ICD-10-CM | POA: Diagnosis not present

## 2020-04-14 DIAGNOSIS — D6859 Other primary thrombophilia: Secondary | ICD-10-CM

## 2020-04-14 DIAGNOSIS — Z8709 Personal history of other diseases of the respiratory system: Secondary | ICD-10-CM

## 2020-04-14 DIAGNOSIS — J302 Other seasonal allergic rhinitis: Secondary | ICD-10-CM

## 2020-04-14 DIAGNOSIS — R809 Proteinuria, unspecified: Secondary | ICD-10-CM

## 2020-04-14 DIAGNOSIS — J0100 Acute maxillary sinusitis, unspecified: Secondary | ICD-10-CM

## 2020-04-14 DIAGNOSIS — J410 Simple chronic bronchitis: Secondary | ICD-10-CM

## 2020-04-14 LAB — COMPREHENSIVE METABOLIC PANEL
ALT: 57 IU/L — ABNORMAL HIGH (ref 0–32)
AST: 30 IU/L (ref 0–40)
Albumin/Globulin Ratio: 2.1 (ref 1.2–2.2)
Albumin: 4.8 g/dL (ref 3.8–4.8)
Alkaline Phosphatase: 136 IU/L — ABNORMAL HIGH (ref 44–121)
BUN/Creatinine Ratio: 19 (ref 9–23)
BUN: 13 mg/dL (ref 6–24)
Bilirubin Total: 0.3 mg/dL (ref 0.0–1.2)
CO2: 24 mmol/L (ref 20–29)
Calcium: 10.7 mg/dL — ABNORMAL HIGH (ref 8.7–10.2)
Chloride: 99 mmol/L (ref 96–106)
Creatinine, Ser: 0.68 mg/dL (ref 0.57–1.00)
GFR calc Af Amer: 121 mL/min/{1.73_m2} (ref >59)
GFR calc non Af Amer: 105 mL/min/{1.73_m2} (ref >59)
Globulin, Total: 2.3 g/dL (ref 1.5–4.5)
Glucose: 155 mg/dL — ABNORMAL HIGH (ref 65–99)
Potassium: 4.4 mmol/L (ref 3.5–5.2)
Sodium: 139 mmol/L (ref 134–144)
Total Protein: 7.1 g/dL (ref 6.0–8.5)

## 2020-04-14 LAB — POCT GLYCOSYLATED HEMOGLOBIN (HGB A1C): Hemoglobin A1C: 6.9 % — AB (ref 4.0–5.6)

## 2020-04-14 LAB — GLUCOSE, POCT (MANUAL RESULT ENTRY): POC Glucose: 155 mg/dl — AB (ref 70–99)

## 2020-04-14 MED ORDER — FLUTICASONE PROPIONATE 50 MCG/ACT NA SUSP: 2.0000 | Freq: Every day | NASAL | 12 refills | Status: DC

## 2020-04-14 MED ORDER — BREO ELLIPTA 200-25 MCG/INH IN AEPB: 1.0000 | INHALATION_SPRAY | Freq: Every day | RESPIRATORY_TRACT | 11 refills | Status: DC

## 2020-04-14 MED ORDER — LISINOPRIL 5 MG PO TABS: 5.0000 mg | ORAL_TABLET | Freq: Every day | ORAL | 3 refills | Status: DC

## 2020-04-14 MED ORDER — AZITHROMYCIN 250 MG PO TABS: ORAL_TABLET | ORAL | 0 refills | Status: DC

## 2020-04-14 MED ORDER — TRULICITY 0.75 MG/0.5ML ~~LOC~~ SOAJ: 0.7500 mg | SUBCUTANEOUS | 5 refills | Status: DC

## 2020-04-14 NOTE — Assessment & Plan Note (Signed)
Hemoglobin A1c at 6.9 on Metformin 500 mg twice a day. We will start Trulicity 0.75 mg weekly. Diet and nutrition discussed. Follow-up in 3 months.

## 2020-04-14 NOTE — Progress Notes (Signed)
Shelly [name] 44 y.o.   Chief Complaint  Patient presents with  . Diabetes  . Medication Refill    Breo and Flonase, also patient needs Reli On premier test strips    HISTORY OF PRESENT ILLNESS: This is a 44 y.o. female here for follow-up of chronic medical problems. 1.  Diabetes: On Metformin 500 mg twice a day.  Elevated morning glucose levels at home. 2.  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 3.  Chronic A. fib.  Status post ablation last October.  Taken off Eliquis and flecainide and Cardizem.  Presently on metoprolol 100 mg daily 4.  Smoking less Fully vaccinated against Covid plus booster. Also complaining of pain to left ear with sinus congestion.  Suspecting sinus infection. No other complaints or medical concerns today.  HPI   Prior to Admission medications   Medication Sig Start Date End Date Taking? Authorizing Provider  albuterol (PROVENTIL HFA;VENTOLIN HFA) 108 (90 Base) MCG/ACT inhaler Inhale 1-2 puffs into the lungs every 4 (four) hours as needed. 07/16/17  Yes Benjiman Core D, PA-C  aspirin 325 MG tablet Take 325 mg by mouth.   Yes [provider]  Earlie Server [phone] MCG/INH AEPB INHALE 1 PUFF INTO THE LUNGS DAILY 01/09/19  Yes Petrona Wyeth, Eilleen Kempf, MD  cetirizine (ZYRTEC) 10 MG tablet Take 10 mg by mouth daily.   Yes [provider]  fluticasone (FLONASE) 50 MCG/ACT nasal spray Place 2 sprays into both nostrils daily. 01/01/18  Yes Zoua Caporaso, Eilleen Kempf, MD  metFORMIN (GLUCOPHAGE) 500 MG tablet Take 1 tablet (500 mg total) by mouth 2 (two) times daily with a meal. 10/16/19  Yes Tiombe Tomeo, Eilleen Kempf, MD  metoprolol tartrate (LOPRESSOR) 50 MG tablet Take 50 mg by mouth 2 (two) times daily.   Yes [provider]  montelukast (SINGULAIR) 10 MG tablet TAKE 1 TABLET BY MOUTH EVERYDAY AT BEDTIME 03/21/20  Yes Chanz Cahall, Eilleen Kempf, MD  venlafaxine XR (EFFEXOR-XR) 150 MG 24 hr capsule Take 150 mg by mouth daily with breakfast.   Yes [provider]  venlafaxine XR (EFFEXOR-XR) 75 MG 24 hr capsule Take 75 mg by mouth. 08/22/16  Yes [provider]  Apixaban (ELIQUIS PO) Take 5 mg by mouth 2 (two) times daily.  Patient not taking: Reported on 04/14/2020    [provider]  atorvastatin (LIPITOR) 20 MG tablet Take 1 tablet (20 mg total) by mouth daily. 10/21/19 01/19/20  Georgina Quint, MD  diltiazem (CARDIZEM) 120 MG tablet Take 120 mg by mouth daily. Patient not taking: Reported on 04/14/2020    [provider]  estradiol (ESTRACE) 2 MG tablet Take 2 mg by mouth daily. Patient not taking: Reported on 04/14/2020    [provider]  flecainide (TAMBOCOR) 100 MG tablet Take 100 mg by mouth 2 (two) times daily. Patient not taking: Reported on 04/14/2020    [provider]  nicotine (NICODERM CQ - DOSED IN MG/24 HOURS) 14 mg/24hr patch PLACE 1 PATCH (14 MG TOTAL) ONTO THE SKIN DAILY. Patient not taking: Reported on 04/14/2020 02/03/19   Georgina Quint, MD  prochlorperazine (COMPAZINE) 10 MG tablet Take 1 tablet (10 mg total) by mouth every 8 (eight) hours as needed for nausea or vomiting. Patient not taking: Reported on 04/14/2020 05/20/18   Lezlie Lye, Meda Coffee, MD    Allergies  Allergen Reactions  . Gramineae Pollens Hives  . Other Hives, Itching and Shortness Of Breath  . Clindamycin   . Clindamycin/Lincomycin   .  Keflex [Cephalexin]   . Lincomycin Hcl   . Penicillins   . Sulfa Antibiotics Hives  . Sulfonamide Derivatives     Patient Active Problem List   Diagnosis Date Noted  . New onset type 2 diabetes mellitus (HCC) 10/16/2019  . Chronic atrial fibrillation (HCC) 10/16/2019  . Current use of long term anticoagulation 10/16/2019  . History of asthma 01/01/2018  . Chronic seasonal allergic rhinitis 01/01/2018  . Depression 09/07/2016  . 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  Smoking addiction 06/05/2014  . 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 Allergy   . Anemia   . Anxiety   . Arthritis   . Depression   . Diabetes mellitus without complication (HCC)    Phreesia 12/31/2019  . GERD (gastroesophageal reflux disease)   . Migraines   . Miscarriage   . PFO (patent foramen ovale)   . Sarcoidosis   . Sleep apnea    [employer]reesia 12/31/2019  . Stroke (HCC)   . TIA (transient ischemic attack)     Past Surgical History:  Procedure Laterality Date  . ABDOMINAL HYSTERECTOMY N/A    [employer]reesia 12/31/2019  . BRONCHOSCOPY    . CESAREAN SECTION     miscarriage  . CHOLECYSTECTOMY    . HAND SURGERY    . miscarriage  08/10/14  . NASAL SINUS SURGERY      Social History   Socioeconomic History  . Marital status: Married    Spouse name: Not on file  . Number of children: 1  . Years of education: Not on file  . Highest education level: Not on file  Occupational History  . Not on file  Tobacco Use  . Smoking status: Current Every Day Smoker    Packs/day: 1.00    Years: 26.00    Pack years: 26.00    Types: Cigarettes  . Smokeless tobacco: Never Used  Vaping Use  . Vaping Use: Never used  Substance and Sexual Activity  . Alcohol use: No    Alcohol/week: 0.0 standard drinks  . Drug use: No  . Sexual activity: Yes  Other Topics Concern  . Not on file  Social History Narrative  . Not on file   Social Determinants of Health   Financial Resource Strain: Not on file  Food Insecurity: Food Insecurity Present  . Worried About Programme researcher, broadcasting/film/video in the Last Year: Sometimes true  . Ran Out of Food in the Last Year: Never true  Transportation Needs: Not on file  Physical Activity: Not on file  Stress: Not on file  Social Connections: Not on file  Intimate Partner Violence: Not on file    Family History  Problem Relation Age of Onset  . Osteoporosis Mother   . Asthma Mother   . Diabetes Father   . Heart  disease Father   . Kidney disease Maternal Grandmother   . Heart disease Maternal Grandfather   . Cancer Paternal Grandmother   . Heart disease Paternal Grandfather      Review of Systems  Constitutional: Negative.  Negative for chills and fever.  HENT: Positive for congestion, ear pain and sinus pain. Negative for sore throat.   Respiratory: Positive for cough. Negative for shortness of breath.   Cardiovascular: Negative.  Negative for chest pain and palpitations.  Gastrointestinal: Negative.  Negative for abdominal pain, diarrhea, nausea and vomiting.  Genitourinary: Negative.  Negative  for dysuria and hematuria.  Skin: Negative.  Negative for rash.  Neurological: Negative.  Negative for dizziness and headaches.  All other systems reviewed and are negative.   Vitals:   04/14/20 1011  BP: 119/80  Pulse: 70  Resp: 16  Temp: (!) 95 F (35 C)  SpO2: 96%   Wt Readings from Last 3 Encounters:  04/14/20 192 lb (87.1 kg)  01/19/20 195 lb 6.4 oz (88.6 kg)  10/16/19 204 lb (92.5 kg)    Physical Exam Vitals reviewed.  Constitutional:      Appearance: Normal appearance.  HENT:     Head: Normocephalic.     Right Ear: Tympanic membrane, ear canal and external ear normal.     Left Ear: Tympanic membrane, ear canal and external ear normal.     Nose: Congestion present.     Mouth/Throat:     Mouth: Mucous membranes are moist.     Pharynx: Oropharynx is clear.  Eyes:     Extraocular Movements: Extraocular movements intact.     Conjunctiva/sclera: Conjunctivae normal.     Pupils: Pupils are equal, round, and reactive to light.  Cardiovascular:     Rate and Rhythm: Normal rate and regular rhythm.     Pulses: Normal pulses.     Heart sounds: Normal heart sounds.  Pulmonary:     Effort: Pulmonary effort is normal.     Breath sounds: Normal breath sounds.  Musculoskeletal:        General: Normal range of motion.     Cervical back: Normal range of motion and neck supple.   Skin:    General: Skin is warm and dry.     Capillary Refill: Capillary refill takes less than 2 seconds.  Neurological:     General: No focal deficit present.     Mental Status: She is alert and oriented to person, place, and time.  Psychiatric:        Mood and Affect: Mood normal.        Behavior: Behavior normal.     Results for orders placed or performed in visit on 04/14/20 (from the past 24 hour(s))  POCT glucose (manual entry)     Status: Abnormal   Collection Time: 04/14/20 10:56 AM  Result Value Ref Range   POC Glucose 155 (A) 70 - 99 mg/dl  POCT glycosylated hemoglobin (Hb A1C)     Status: Abnormal   Collection Time: 04/14/20 10:58 AM  Result Value Ref Range   Hemoglobin A1C 6.9 (A) 4.0 - 5.6 %   HbA1c POC (<> result, manual entry)     HbA1c, POC (prediabetic range)     HbA1c, POC (controlled diabetic range)      ASSESSMENT & PLAN: New onset type 2 diabetes mellitus (HCC) Hemoglobin A1c at 6.9 on Metformin 500 mg twice a day. We will start Trulicity 0.75 mg weekly. Diet and nutrition discussed. Follow-up in 3 months.  Shelly [name] was seen today for diabetes and medication refill.  Diagnoses and all orders for this visit:  Type 2 diabetes mellitus with hyperglycemia, without long-term current use of insulin (HCC) -     Comprehensive metabolic panel -     POCT glucose (manual entry) -     POCT glycosylated hemoglobin (Hb A1C) -     HM Diabetes Foot Exam -     Dulaglutide (TRULICITY) 0.75 MG/0.5ML SOPN; Inject 0.75 mg into the skin once a week.  Chronic atrial fibrillation (HCC)  Sarcoidosis of lung (HCC)  Current use  of long term anticoagulation  Thrombophilia (HCC)  History of asthma -     fluticasone furoate-vilanterol (BREO ELLIPTA) 200-25 MCG/INH AEPB; Inhale 1 puff into the lungs daily.  Microalbuminuria -     lisinopril (ZESTRIL) 5 MG tablet; Take 1 tablet (5 mg total) by mouth daily.  Chronic seasonal allergic rhinitis -     fluticasone (FLONASE)  50 MCG/ACT nasal spray; Place 2 sprays into both nostrils daily.  Sinus congestion  Acute non-recurrent maxillary sinusitis -     azithromycin (ZITHROMAX) 250 MG tablet; Sig as indicated  Smokers' cough [facility])    Patient Instructions  Diabetes Mellitus and Nutrition, Adult When you have diabetes (diabetes mellitus), it is very important to have healthy eating habits because your blood sugar (glucose) levels are greatly affected by what you eat and drink. Eating healthy foods in the appropriate amounts, at about the same times every day, can help you:  Control your blood glucose.  Lower your risk of heart disease.  Improve your blood pressure.  Reach or maintain a healthy weight. Every person with diabetes is different, and each person has different needs for a meal plan. Your health care provider may recommend that you work with a diet and nutrition specialist (dietitian) to make a meal plan that is best for you. Your meal plan may vary depending on factors such as:  The calories you need.  The medicines you take.  Your weight.  Your blood glucose, blood pressure, and cholesterol levels.  Your activity level.  Other health conditions you have, such as heart or kidney disease. How do carbohydrates affect me? Carbohydrates, also called carbs, affect your blood glucose level more than any other type of food. Eating carbs naturally raises the amount of glucose in your blood. Carb counting is a method for keeping track of how many carbs you eat. Counting carbs is important to keep your blood glucose at a healthy level, especially if you use insulin or take certain oral diabetes medicines. It is important to know how many carbs you can safely have in each meal. This is different for every person. Your dietitian can help you calculate how many carbs you should have at each meal and for each snack. Foods that contain carbs include:  Bread, cereal, rice, pasta, and crackers.  Potatoes  and corn.  Peas, beans, and lentils.  Milk and yogurt.  Fruit and juice.  Desserts, such as cakes, cookies, ice cream, and candy. How does alcohol affect me? Alcohol can cause a sudden decrease in blood glucose (hypoglycemia), especially if you use insulin or take certain oral diabetes medicines. Hypoglycemia can be a life-threatening condition. Symptoms of hypoglycemia (sleepiness, dizziness, and confusion) are similar to symptoms of having too much alcohol. If your health care provider says that alcohol is safe for you, follow these guidelines:  Limit alcohol intake to no more than 1 drink per day for nonpregnant women and 2 drinks per day for men. One drink equals 12 oz of beer, 5 oz of wine, or 1 oz of hard liquor.  Do not drink on an empty stomach.  Keep yourself hydrated with water, diet soda, or unsweetened iced tea.  Keep in mind that regular soda, juice, and other mixers may contain a lot of sugar and must be counted as carbs. What are tips for following this plan?  Reading food labels  Start by checking the serving size on the "Nutrition Facts" label of packaged foods and drinks. The amount of calories, carbs,  fats, and other nutrients listed on the label is based on one serving of the item. Many items contain more than one serving per package.  Check the total grams (g) of carbs in one serving. You can calculate the number of servings of carbs in one serving by dividing the total carbs by 15. For example, if a food has 30 g of total carbs, it would be equal to 2 servings of carbs.  Check the number of grams (g) of saturated and trans fats in one serving. Choose foods that have low or no amount of these fats.  Check the number of milligrams (mg) of salt (sodium) in one serving. Most people should limit total sodium intake to less than 2,300 mg per day.  Always check the nutrition information of foods labeled as "low-fat" or "nonfat". These foods may be higher in added sugar  or refined carbs and should be avoided.  Talk to your dietitian to identify your daily goals for nutrients listed on the label. Shopping  Avoid buying canned, premade, or processed foods. These foods tend to be high in fat, sodium, and added sugar.  Shop around the outside edge of the grocery store. This includes fresh fruits and vegetables, bulk grains, fresh meats, and fresh dairy. Cooking  Use low-heat cooking methods, such as baking, instead of high-heat cooking methods like deep frying.  Cook using healthy oils, such as olive, canola, or sunflower oil.  Avoid cooking with butter, cream, or high-fat meats. Meal planning  Eat meals and snacks regularly, preferably at the same times every day. Avoid going long periods of time without eating.  Eat foods high in fiber, such as fresh fruits, vegetables, beans, and whole grains. Talk to your dietitian about how many servings of carbs you can eat at each meal.  Eat 4-6 ounces (oz) of lean protein each day, such as lean meat, chicken, fish, eggs, or tofu. One oz of lean protein is equal to: ? 1 oz of meat, chicken, or fish. ? 1 egg. ?  cup of tofu.  Eat some foods each day that contain healthy fats, such as avocado, nuts, seeds, and fish. Lifestyle  Check your blood glucose regularly.  Exercise regularly as told by your health care provider. This may include: ? 150 minutes of moderate-intensity or vigorous-intensity exercise each week. This could be brisk walking, biking, or water aerobics. ? Stretching and doing strength exercises, such as yoga or weightlifting, at least 2 times a week.  Take medicines as told by your health care provider.  Do not use any products that contain nicotine or tobacco, such as cigarettes and e-cigarettes. If you need help quitting, ask your health care provider.  Work with a Veterinary surgeon or diabetes educator to identify strategies to manage stress and any emotional and social challenges. Questions to  ask a health care provider  Do I need to meet with a diabetes educator?  Do I need to meet with a dietitian?  What number can I call if I have questions?  When are the best times to check my blood glucose? Where to find more information:  American Diabetes Association: diabetes.org  Academy of Nutrition and Dietetics: www.eatright.[employer] of Diabetes and Digestive and Kidney Diseases (NIH): CarFlippers.tn Summary  A healthy meal plan will help you control your blood glucose and maintain a healthy lifestyle.  Working with a diet and nutrition specialist (dietitian) can help you make a meal plan that is best for you.  Keep in  mind that carbohydrates (carbs) and alcohol have immediate effects on your blood glucose levels. It is important to count carbs and to use alcohol carefully. This information is not intended to replace advice given to you by your health care provider. Make sure you discuss any questions you have with your health care provider. Document Revised: 03/16/2017 Document Reviewed: 05/08/2016 Elsevier Patient Education  2020 Elsevier Inc.      Edwina BarthMiguel Cynthie Garmon, MD [facility]

## 2020-04-14 NOTE — Patient Instructions (Signed)

## 2020-07-05 ENCOUNTER — Encounter: Payer: Self-pay | Admitting: Emergency Medicine

## 2020-07-05 ENCOUNTER — Telehealth (INDEPENDENT_AMBULATORY_CARE_PROVIDER_SITE_OTHER): Payer: BC Managed Care – PPO | Admitting: Emergency Medicine

## 2020-07-05 ENCOUNTER — Other Ambulatory Visit: Payer: Self-pay

## 2020-07-05 VITALS — Ht 64.25 in | Wt 190.0 lb

## 2020-07-05 DIAGNOSIS — Z8709 Personal history of other diseases of the respiratory system: Secondary | ICD-10-CM

## 2020-07-05 DIAGNOSIS — R059 Cough, unspecified: Secondary | ICD-10-CM | POA: Diagnosis not present

## 2020-07-05 DIAGNOSIS — R062 Wheezing: Secondary | ICD-10-CM

## 2020-07-05 DIAGNOSIS — J22 Unspecified acute lower respiratory infection: Secondary | ICD-10-CM | POA: Diagnosis not present

## 2020-07-05 DIAGNOSIS — I482 Chronic atrial fibrillation, unspecified: Secondary | ICD-10-CM

## 2020-07-05 DIAGNOSIS — Z7901 Long term (current) use of anticoagulants: Secondary | ICD-10-CM

## 2020-07-05 DIAGNOSIS — D860 Sarcoidosis of lung: Secondary | ICD-10-CM

## 2020-07-05 DIAGNOSIS — E1165 Type 2 diabetes mellitus with hyperglycemia: Secondary | ICD-10-CM

## 2020-07-05 DIAGNOSIS — Z1211 Encounter for screening for malignant neoplasm of colon: Secondary | ICD-10-CM

## 2020-07-05 MED ORDER — PREDNISONE 20 MG PO TABS: 40.0000 mg | ORAL_TABLET | Freq: Every day | ORAL | 0 refills | Status: AC

## 2020-07-05 MED ORDER — AZITHROMYCIN 250 MG PO TABS: ORAL_TABLET | ORAL | 0 refills | Status: AC

## 2020-07-05 NOTE — Patient Instructions (Signed)
° ° ° °  If you have lab work done today you will be contacted with your lab results within the next 2 weeks.  If you have not heard from us then please contact us. The fastest way to get your results is to register for My Chart. ° ° °IF you received an x-ray today, you will receive an invoice from [facility]. Please contact [facility] at [phone] with questions or concerns regarding your invoice.  ° °IF you received labwork today, you will receive an invoice from [facility]. Please contact [facility] at [phone] with questions or concerns regarding your invoice.  ° °Our billing staff will not be able to assist you with questions regarding bills from these companies. ° °You will be contacted with the lab results as soon as they are available. The fastest way to get your results is to activate your My Chart account. Instructions are located on the last page of this paperwork. If you have not heard from us regarding the results in 2 weeks, please contact this office. °  ° ° ° °

## 2020-07-05 NOTE — Progress Notes (Signed)
Telemedicine Encounter- SOAP NOTE Established Patient Patient: Home  Provider: Office     This telephone encounter was conducted with the patient's (or proxy's) verbal consent via audio telecommunications: yes/no: Yes Patient was instructed to have this encounter in a suitably private space; and to only have persons present to whom they give permission to participate. In addition, patient identity was confirmed by use of name plus two identifiers (DOB and address).  I discussed the limitations, risks, security and privacy concerns of performing an evaluation and management service by telephone and the availability of in person appointments. I also discussed with the patient that there may be a patient responsible charge related to this service. The patient expressed understanding and agreed to proceed.  I spent a total of TIME; 0 MIN TO 60 MIN: 20 minutes talking with the patient or their proxy.  Chief Complaint  Patient presents with  . Cough    And wheezing started 2 weeks ago, nonproductive cough    Subjective   Shelly [name] is a 45 y.o. female established patient. Telephone visit today complaining of coughing and wheezing that started 2 weeks ago.  Negative Covid test.  Denies difficulty breathing or chest pain.  Nonproductive cough.  Gradually getting worse.  Has history of sarcoidosis and asthma.  Diabetic with history of chronic A. fib on long-term anticoagulation.  No other complaints or significant symptoms.  HPI   Patient Active Problem List   Diagnosis Date Noted  . New onset type 2 diabetes mellitus (HCC) 10/16/2019  . Chronic atrial fibrillation (HCC) 10/16/2019  . Current use of long term anticoagulation 10/16/2019  . History of asthma 01/01/2018  . Chronic seasonal allergic rhinitis 01/01/2018  . Depression 09/07/2016  . 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 Smoking addiction  06/05/2014  . 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 Allergy   . Anemia   . Anxiety   . Arthritis   . Depression   . Diabetes mellitus without complication (HCC)    Phreesia 12/31/2019  . GERD (gastroesophageal reflux disease)   . Migraines   . Miscarriage   . PFO (patent foramen ovale)   . Sarcoidosis   . Sleep apnea    Phreesia 12/31/2019  . Stroke (HCC)   . TIA (transient ischemic attack)     Current Outpatient Medications  Medication Sig Dispense Refill  . albuterol (PROVENTIL HFA;VENTOLIN HFA) 108 (90 Base) MCG/ACT inhaler Inhale 1-2 puffs into the lungs every 4 (four) hours as needed. 6.7 g 0  . aspirin 81 MG EC tablet Take 81 mg by mouth daily. Swallow whole.    . cetirizine (ZYRTEC) 10 MG tablet Take 10 mg by mouth daily.    . Dulaglutide (TRULICITY) 0.75 MG/0.5ML SOPN Inject 0.75 mg into the skin once a week. 6 mL 5  . fluticasone (FLONASE) 50 MCG/ACT nasal spray Place 2 sprays into both nostrils daily. 16 g 12  . fluticasone furoate-vilanterol (BREO ELLIPTA) 200-25 MCG/INH AEPB Inhale 1 puff into the lungs daily. 60 each 11  . lisinopril (ZESTRIL) 5 MG tablet Take 1 tablet (5 mg total) by mouth daily. 90 tablet 3  . metFORMIN (GLUCOPHAGE) 500 MG tablet Take 1 tablet (500 mg total) by mouth 2 (two) times daily with a meal. 180 tablet 3  . metoprolol tartrate (LOPRESSOR) 50 MG tablet Take 50 mg by mouth 2 (two) times daily.    Marland Kitchen  montelukast (SINGULAIR) 10 MG tablet TAKE 1 TABLET BY MOUTH EVERYDAY AT BEDTIME 90 tablet 2  . venlafaxine XR (EFFEXOR-XR) 150 MG 24 hr capsule Take 150 mg by mouth daily with breakfast.    . venlafaxine XR (EFFEXOR-XR) 75 MG 24 hr capsule Take 75 mg by mouth.    . Apixaban (ELIQUIS PO) Take 5 mg by mouth 2 (two) times daily.  (Patient not taking: Reported on 07/05/2020)    . atorvastatin (LIPITOR) 20 MG tablet Take 1 tablet (20 mg total) by mouth daily. 90 tablet 3  . azithromycin (ZITHROMAX) 250 MG  tablet Sig as indicated (Patient not taking: Reported on 07/05/2020) 6 tablet 0  . estradiol (ESTRACE) 2 MG tablet Take 2 mg by mouth daily. (Patient not taking: Reported on 07/05/2020)    . flecainide (TAMBOCOR) 100 MG tablet Take 100 mg by mouth 2 (two) times daily. (Patient not taking: Reported on 07/05/2020)    . nicotine (NICODERM CQ - DOSED IN MG/24 HOURS) 14 mg/24hr patch PLACE 1 PATCH (14 MG TOTAL) ONTO THE SKIN DAILY. (Patient not taking: Reported on 07/05/2020) 28 patch 3  . prochlorperazine (COMPAZINE) 10 MG tablet Take 1 tablet (10 mg total) by mouth every 8 (eight) hours as needed for nausea or vomiting. (Patient not taking: Reported on 07/05/2020) 20 tablet 0   No current facility-administered medications for this visit.    Allergies  Allergen Reactions  . Gramineae Pollens Hives  . Other Hives, Itching and Shortness Of Breath  . Clindamycin   . Clindamycin/Lincomycin   . Keflex [Cephalexin]   . Lincomycin Hcl   . Penicillins   . Sulfa Antibiotics Hives  . Sulfonamide Derivatives     Social History   Socioeconomic History  . Marital status: Married    Spouse name: Not on file  . Number of children: 1  . Years of education: Not on file  . Highest education level: Not on file  Occupational History  . Not on file  Tobacco Use  . Smoking status: Current Every Day Smoker    Packs/day: 1.00    Years: 26.00    Pack years: 26.00    Types: Cigarettes  . Smokeless tobacco: Never Used  Vaping Use  . Vaping Use: Never used  Substance and Sexual Activity  . Alcohol use: No    Alcohol/week: 0.0 standard drinks  . Drug use: No  . Sexual activity: Yes  Other Topics Concern  . Not on file  Social History Narrative  . Not on file   Social Determinants of Health   Financial Resource Strain: Not on file  Food Insecurity: Food Insecurity Present  . Worried About Programme researcher, broadcasting/film/video in the Last Year: Sometimes true  . Ran Out of Food in the Last Year: Never true   Transportation Needs: Not on file  Physical Activity: Not on file  Stress: Not on file  Social Connections: Not on file  Intimate Partner Violence: Not on file    Review of Systems  Constitutional: Negative.  Negative for chills and fever.  HENT: Negative.  Negative for congestion and sore throat.   Respiratory: Positive for cough and wheezing. Negative for shortness of breath.   Cardiovascular: Negative.  Negative for chest pain and palpitations.  Gastrointestinal: Negative.  Negative for abdominal pain, diarrhea, nausea and vomiting.  Genitourinary: Negative.  Negative for dysuria and hematuria.  Skin: Negative.   Neurological: Negative.  Negative for dizziness and headaches.  All other systems reviewed and are negative.  Objective  Alert and oriented x3 in no apparent respiratory distress. Vitals as reported by the patient: Today's Vitals   07/05/20 1641  Weight: 190 lb (86.2 kg)  Height: 5' 4.25" (1.632 m)    There are no diagnoses linked to this encounter. Shelly [name] was seen today for cough.  Diagnoses and all orders for this visit:  Cough  Lower respiratory infection -     azithromycin (ZITHROMAX) 250 MG tablet; Sig as indicated  Wheezing -     predniSONE (DELTASONE) 20 MG tablet; Take 2 tablets (40 mg total) by mouth daily with breakfast for 5 days.  Sarcoidosis of lung (HCC)  Chronic atrial fibrillation (HCC)  Current use of long term anticoagulation  History of asthma  Colon cancer screening -     Ambulatory referral to Gastroenterology  Type 2 diabetes mellitus with hyperglycemia, without long-term current use of insulin (HCC) -     Ambulatory referral to Ophthalmology     I discussed the assessment and treatment plan with the patient. The patient was provided an opportunity to ask questions and all were answered. The patient agreed with the plan and demonstrated an understanding of the instructions.   The patient was advised to call back or seek  an in-person evaluation if the symptoms worsen or if the condition fails to improve as anticipated.  I provided 20 minutes of non-face-to-face time during this encounter.  Georgina Quint, MD  Primary Care at [facility]

## 2020-07-06 ENCOUNTER — Ambulatory Visit: Payer: Self-pay | Admitting: *Deleted

## 2020-07-06 NOTE — Telephone Encounter (Signed)
Pt called stating that she has had a Blood sugar spike. She states that her sugar level was up to 401 and came back down to 305. She states that she is not sure what to do and is requesting to have a call back. Please advise.  Patient c/o elevated blood sugar today and noted symptoms of fuzzy vision and lightheadedness. Blood glucose was 381 , she drank water and rechecked for 340 sat down to rest and rechecked for 401, 342,328,359,365, and 312 15 minutes ago . Newly diagnosed diabetic in July. Patient reports feeling ok now no lightheadedness or fuzzy vision. Recently started on prednisone and z pack. Contacted Mary in office for advise from PCP.called transferred to office. Care advise given. Patient verbalized understanding of care advise and to go to [facility] or ED if symptoms worsen.   Answer Assessment - Initial Assessment Questions 1. BLOOD GLUCOSE: "What is your blood glucose level?"      *No Answer* 2. ONSET: "When did you check the blood glucose?"     *No Answer* 3. USUAL RANGE: "What is your glucose level usually?" (e.g., usual fasting morning value, usual evening value)     *No Answer* 4. KETONES: "Do you check for ketones (urine or blood test strips)?" If yes, ask: "What does the test show now?"      *No Answer* 5. TYPE 1 or 2:  "Do you know what type of diabetes you have?"  (e.g., Type 1, Type 2, Gestational; doesn't know)      Type 2  6. INSULIN: "Do you take insulin?" "What type of insulin(s) do you use? What is the mode of delivery? (syringe, pen; injection or pump)?"      trulicity 7. DIABETES PILLS: "Do you take any pills for your diabetes?" If yes, ask: "Have you missed taking any pills recently?"     *No Answer* 8. OTHER SYMPTOMS: "Do you have any symptoms?" (e.g., fever, frequent urination, difficulty breathing, dizziness, weakness, vomiting)     *No Answer* 9. PREGNANCY: "Is there any chance you are pregnant?" "When was your last menstrual period?"     *No Answer*  Protocols  used: DIABETES - HIGH BLOOD SUGAR-A-AH

## 2020-07-06 NOTE — Telephone Encounter (Signed)
Patient just called and stated her  Blood sugar has come down ro 278 but she still concern since this is only day one of her starting Prednisone and abx. Please advise

## 2020-07-07 ENCOUNTER — Telehealth: Payer: BC Managed Care – PPO | Admitting: Emergency Medicine

## 2020-07-07 NOTE — Telephone Encounter (Signed)
Continue medications.  Thanks.

## 2020-07-08 ENCOUNTER — Telehealth: Payer: Self-pay | Admitting: Emergency Medicine

## 2020-07-08 ENCOUNTER — Telehealth: Payer: Self-pay | Admitting: *Deleted

## 2020-07-08 NOTE — Telephone Encounter (Signed)
I called patient back twice to answer her questions about blood sugar being high. I called two different times to mobile number and automated voice mail states "mail box is full". I was unable to leave a message for patient to call back. I will try to contact patient later and hopes she answer.

## 2020-07-08 NOTE — Telephone Encounter (Signed)
New msg was sent to Dr Irving Shows

## 2020-07-08 NOTE — Telephone Encounter (Signed)
Patient was transferred from [facility] center/ patient / pt very upset that and feels we are not responding to her request . Patient asking to speak with Provider however Provider with patient .  Patient is concerned because her blood sugar is spiking so high.  Clinical nurse tried calling patient back / no answer and unable to LVM because it is full     Clinical will try her back again asap.   Patient ststes that her next call will be to her insurance company and her attorny

## 2020-07-08 NOTE — Telephone Encounter (Signed)
Prednisone was given to treat her wheezing.  She was given a 5-day course which was indicated at that time despite her diagnosis of diabetes. Now that she is finished with the prednisone her sugar will come back down and there is nothing else to do.  Thanks.

## 2020-07-08 NOTE — Telephone Encounter (Signed)
Patient is concern on why you would tell her to continue a medication that is making her blood sugar elevated? Patient she was newly dx with diabetes and her understanding is you do not give prednisone to someone with diabetes. Patient want to speak with you about this issue. I did ask patient have she taking prednisone in the past and she stated yes but before she was dx with diabetes.Patient has stop taken prednisone at this time and her blood sugar has come down.

## 2020-07-09 NOTE — Telephone Encounter (Signed)
Patient was informed.

## 2020-07-27 ENCOUNTER — Other Ambulatory Visit: Payer: Self-pay | Admitting: Emergency Medicine

## 2020-07-27 DIAGNOSIS — Z8709 Personal history of other diseases of the respiratory system: Secondary | ICD-10-CM

## 2020-10-01 ENCOUNTER — Other Ambulatory Visit: Payer: Self-pay | Admitting: Emergency Medicine

## 2020-10-01 DIAGNOSIS — E119 Type 2 diabetes mellitus without complications: Secondary | ICD-10-CM

## 2020-10-21 ENCOUNTER — Other Ambulatory Visit: Payer: Self-pay | Admitting: Emergency Medicine

## 2020-12-26 ENCOUNTER — Other Ambulatory Visit: Payer: Self-pay | Admitting: Emergency Medicine

## 2021-03-26 ENCOUNTER — Other Ambulatory Visit: Payer: Self-pay | Admitting: Emergency Medicine

## 2021-03-26 DIAGNOSIS — R809 Proteinuria, unspecified: Secondary | ICD-10-CM

## 2021-05-18 ENCOUNTER — Other Ambulatory Visit: Payer: Self-pay | Admitting: Emergency Medicine

## 2021-05-18 DIAGNOSIS — E1165 Type 2 diabetes mellitus with hyperglycemia: Secondary | ICD-10-CM

## 2021-07-07 ENCOUNTER — Other Ambulatory Visit: Payer: Self-pay | Admitting: Emergency Medicine

## 2021-07-07 DIAGNOSIS — J302 Other seasonal allergic rhinitis: Secondary | ICD-10-CM

## 2021-09-23 ENCOUNTER — Other Ambulatory Visit: Payer: Self-pay | Admitting: Emergency Medicine

## 2021-10-05 ENCOUNTER — Other Ambulatory Visit: Payer: Self-pay | Admitting: Emergency Medicine

## 2021-10-05 DIAGNOSIS — E119 Type 2 diabetes mellitus without complications: Secondary | ICD-10-CM

## 2022-04-20 ENCOUNTER — Other Ambulatory Visit: Payer: Self-pay | Admitting: Emergency Medicine

## 2022-04-20 DIAGNOSIS — R809 Proteinuria, unspecified: Secondary | ICD-10-CM

## 2022-05-10 ENCOUNTER — Other Ambulatory Visit: Payer: Self-pay | Admitting: Emergency Medicine

## 2022-05-10 DIAGNOSIS — R809 Proteinuria, unspecified: Secondary | ICD-10-CM

## 2022-06-22 ENCOUNTER — Other Ambulatory Visit: Payer: Self-pay | Admitting: Emergency Medicine

## 2022-09-13 ENCOUNTER — Telehealth: Payer: Self-pay

## 2022-09-13 NOTE — Transitions of Care (Post Inpatient/ED Visit) (Signed)
   09/13/2022  Name: Shelly [name] MRN: [id number] DOB: 1975/06/12  Today's TOC FU Call Status: Today's TOC FU Call Status:: Unsuccessul Call (1st Attempt) Unsuccessful Call (1st Attempt) Date: 09/13/22  Attempted to reach the patient regarding the most recent Inpatient/ED visit.  Follow Up Plan: Additional outreach attempts will be made to reach the patient to complete the Transitions of Care (Post Inpatient/ED visit) call.   Signature Karena Addison, LPN [facility] Nurse Health Advisor Direct Dial [phone]

## 2022-09-14 NOTE — Transitions of Care (Post Inpatient/ED Visit) (Signed)
   09/14/2022  Name: MOANI RAEL MRN: [id number] DOB: 03-11-1976  Today's TOC FU Call Status: Today's TOC FU Call Status:: Unsuccessful Call (2nd Attempt) Unsuccessful Call (1st Attempt) Date: 09/13/22 Unsuccessful Call (2nd Attempt) Date: 09/14/22  Attempted to reach the patient regarding the most recent Inpatient/ED visit.  Follow Up Plan: Additional outreach attempts will be made to reach the patient to complete the Transitions of Care (Post Inpatient/ED visit) call.   Signature Karena Addison, LPN [facility] Nurse Health Advisor Direct Dial [phone]

## 2022-09-19 NOTE — Transitions of Care (Post Inpatient/ED Visit) (Signed)
   09/19/2022  Name: AMISSA MOLINE MRN: [id number] DOB: 1975-11-21  Today's TOC FU Call Status: Today's TOC FU Call Status:: Unsuccessful Call (3rd Attempt) Unsuccessful Call (1st Attempt) Date: 09/13/22 Unsuccessful Call (2nd Attempt) Date: 09/14/22 Unsuccessful Call (3rd Attempt) Date: 09/19/22  Attempted to reach the patient regarding the most recent Inpatient/ED visit.  Follow Up Plan: No further outreach attempts will be made at this time. We have been unable to contact the patient.  Signature Karena Addison, LPN [facility] Nurse Health Advisor Direct Dial [phone]

## 2023-03-24 ENCOUNTER — Other Ambulatory Visit: Payer: Self-pay | Admitting: Emergency Medicine

## 2023-03-24 DIAGNOSIS — R809 Proteinuria, unspecified: Secondary | ICD-10-CM
